# Patient Record
Sex: Male | Born: 1935 | Race: White | Hispanic: No | Marital: Married | Smoking: Former smoker
Health system: Southern US, Community
[De-identification: ages and names within clinical notes are randomized; demographics above are authoritative.]

## PROBLEM LIST (undated history)

## (undated) DIAGNOSIS — C4490 Unspecified malignant neoplasm of skin, unspecified: Secondary | ICD-10-CM

## (undated) DIAGNOSIS — T753XXA Motion sickness, initial encounter: Secondary | ICD-10-CM

## (undated) DIAGNOSIS — K219 Gastro-esophageal reflux disease without esophagitis: Secondary | ICD-10-CM

## (undated) DIAGNOSIS — E039 Hypothyroidism, unspecified: Secondary | ICD-10-CM

## (undated) DIAGNOSIS — I6529 Occlusion and stenosis of unspecified carotid artery: Secondary | ICD-10-CM

## (undated) DIAGNOSIS — I2510 Atherosclerotic heart disease of native coronary artery without angina pectoris: Secondary | ICD-10-CM

## (undated) DIAGNOSIS — G629 Polyneuropathy, unspecified: Secondary | ICD-10-CM

## (undated) DIAGNOSIS — Z951 Presence of aortocoronary bypass graft: Secondary | ICD-10-CM

## (undated) DIAGNOSIS — Z87442 Personal history of urinary calculi: Secondary | ICD-10-CM

## (undated) DIAGNOSIS — E119 Type 2 diabetes mellitus without complications: Secondary | ICD-10-CM

## (undated) DIAGNOSIS — I639 Cerebral infarction, unspecified: Secondary | ICD-10-CM

## (undated) DIAGNOSIS — Z974 Presence of external hearing-aid: Secondary | ICD-10-CM

## (undated) DIAGNOSIS — E7800 Pure hypercholesterolemia, unspecified: Secondary | ICD-10-CM

## (undated) HISTORY — PX: TONSILLECTOMY: SUR1361

## (undated) HISTORY — DX: Polyneuropathy, unspecified: G62.9

## (undated) HISTORY — PX: LITHOTRIPSY: SUR834

## (undated) HISTORY — PX: EAR BIOPSY: SHX1480

---

## 2004-11-25 ENCOUNTER — Other Ambulatory Visit: Payer: Self-pay

## 2004-11-25 ENCOUNTER — Inpatient Hospital Stay: Payer: Self-pay | Admitting: Internal Medicine

## 2004-11-28 ENCOUNTER — Inpatient Hospital Stay (HOSPITAL_COMMUNITY): Admission: AD | Admit: 2004-11-28 | Discharge: 2004-12-02 | Payer: Self-pay | Admitting: Cardiothoracic Surgery

## 2004-11-29 HISTORY — PX: CORONARY ARTERY BYPASS GRAFT: SHX141

## 2005-01-09 ENCOUNTER — Encounter: Payer: Self-pay | Admitting: Internal Medicine

## 2005-01-16 ENCOUNTER — Encounter: Payer: Self-pay | Admitting: Internal Medicine

## 2005-02-16 ENCOUNTER — Encounter: Payer: Self-pay | Admitting: Internal Medicine

## 2005-03-18 ENCOUNTER — Encounter: Payer: Self-pay | Admitting: Internal Medicine

## 2006-01-05 ENCOUNTER — Emergency Department: Payer: Self-pay | Admitting: Emergency Medicine

## 2006-03-05 ENCOUNTER — Ambulatory Visit: Payer: Self-pay | Admitting: Internal Medicine

## 2006-05-29 ENCOUNTER — Emergency Department: Payer: Self-pay | Admitting: Emergency Medicine

## 2006-05-30 ENCOUNTER — Other Ambulatory Visit: Payer: Self-pay

## 2010-06-18 DIAGNOSIS — I639 Cerebral infarction, unspecified: Secondary | ICD-10-CM

## 2010-06-18 HISTORY — DX: Cerebral infarction, unspecified: I63.9

## 2015-05-07 ENCOUNTER — Emergency Department
Admission: EM | Admit: 2015-05-07 | Discharge: 2015-05-07 | Disposition: A | Discharge status: Home / Self Care | Payer: Medicare Other | Attending: Emergency Medicine | Admitting: Emergency Medicine

## 2015-05-07 ENCOUNTER — Emergency Department: Payer: Medicare Other

## 2015-05-07 ENCOUNTER — Encounter: Payer: Self-pay | Admitting: Emergency Medicine

## 2015-05-07 DIAGNOSIS — S60411A Abrasion of left index finger, initial encounter: Secondary | ICD-10-CM | POA: Diagnosis not present

## 2015-05-07 DIAGNOSIS — W01198A Fall on same level from slipping, tripping and stumbling with subsequent striking against other object, initial encounter: Secondary | ICD-10-CM | POA: Insufficient documentation

## 2015-05-07 DIAGNOSIS — S60413A Abrasion of left middle finger, initial encounter: Secondary | ICD-10-CM | POA: Diagnosis not present

## 2015-05-07 DIAGNOSIS — S0990XA Unspecified injury of head, initial encounter: Secondary | ICD-10-CM | POA: Diagnosis present

## 2015-05-07 DIAGNOSIS — Y9289 Other specified places as the place of occurrence of the external cause: Secondary | ICD-10-CM | POA: Diagnosis not present

## 2015-05-07 DIAGNOSIS — S01111A Laceration without foreign body of right eyelid and periocular area, initial encounter: Secondary | ICD-10-CM | POA: Diagnosis not present

## 2015-05-07 DIAGNOSIS — Y998 Other external cause status: Secondary | ICD-10-CM | POA: Insufficient documentation

## 2015-05-07 DIAGNOSIS — Y9302 Activity, running: Secondary | ICD-10-CM | POA: Diagnosis not present

## 2015-05-07 DIAGNOSIS — S0083XA Contusion of other part of head, initial encounter: Secondary | ICD-10-CM

## 2015-05-07 MED ORDER — BACITRACIN-NEOMYCIN-POLYMYXIN 400-5-5000 EX OINT
TOPICAL_OINTMENT | Freq: Once | CUTANEOUS | Status: AC
  Administered 2015-05-07: 1 via TOPICAL
  Filled 2015-05-07: qty 1

## 2015-05-07 NOTE — ED Notes (Signed)
NAD noted at this time. Pt refused wheelchair to the lobby. Pt denies comments/concerns at this time.

## 2015-05-07 NOTE — ED Notes (Addendum)
Pt to ed with c/o fall today.  Pt states he ran into a boy at parade today and fell and hit head.  Denies loss of consciousness, pt denies use of blood thinner. Pt with laceration to right side of forehead.  Bleeding controlled at this time. Pt alert and oriented at this time.

## 2015-05-07 NOTE — Discharge Instructions (Signed)
Facial Laceration A facial laceration is a cut on the face. These injuries can be painful and cause bleeding. Some cuts may need to be closed with stitches (sutures), skin adhesive strips, or wound glue. Cuts usually heal quickly but can leave a scar. It can take 1-2 years for the scar to go away completely. HOME CARE   Only take medicines as told by your doctor.  Follow your doctor's instructions for wound care. For Stitches:  Keep the cut clean and dry.  If you have a bandage (dressing), change it at least once a day. Change the bandage if it gets wet or dirty, or as told by your doctor.  Wash the cut with soap and water 2 times a day. Rinse the cut with water. Pat it dry with a clean towel.  Put a thin layer of medicated cream on the cut as told by your doctor.  You may shower after the first 24 hours. Do not soak the cut in water until the stitches are removed.  Have your stitches removed as told by your doctor.  Do not wear any makeup until a few days after your stitches are removed. For Skin Adhesive Strips:  Keep the cut clean and dry.  Do not get the strips wet. You may take a bath, but be careful to keep the cut dry.  If the cut gets wet, pat it dry with a clean towel.  The strips will fall off on their own. Do not remove the strips that are still stuck to the cut. For Wound Glue:  You may shower or take baths. Do not soak or scrub the cut. Do not swim. Avoid heavy sweating until the glue falls off on its own. After a shower or bath, pat the cut dry with a clean towel.  Do not put medicine or makeup on your cut until the glue falls off.  If you have a bandage, do not put tape over the glue.  Avoid lots of sunlight or tanning lamps until the glue falls off.  The glue will fall off on its own in 5-10 days. Do not pick at the glue. After Healing:  Put sunscreen on the cut for the first year to reduce your scar. GET HELP IF:  You have a fever. GET HELP RIGHT AWAY  IF:   Your cut area gets red, painful, or puffy (swollen).  You see a yellowish-white fluid (pus) coming from the cut.   This information is not intended to replace advice given to you by your health care provider. Make sure you discuss any questions you have with your health care provider.   Document Released: 11/21/2007 Document Revised: 06/25/2014 Document Reviewed: 01/15/2013 Elsevier Interactive Patient Education [employer].

## 2015-05-07 NOTE — ED Provider Notes (Signed)
[facility] Emergency Department Provider Note  ____________________________________________  Time seen: Approximately 1:32 PM  I have reviewed the triage vital signs and the nursing notes.   HISTORY  Chief Complaint Head Injury    HPI Gary Craig is a 81 y.o. male patient with a contusion laceration to the right forehead. Patient state using 3) to avoid causing the trip and fall hitting his head. Patient denies any loss of consciousness. Patient denies any use of blood tenderness. Patient state bleeding controlled direct pressure. Patient alert and orientated. Patient is rating his pain as a 0.   History reviewed. No pertinent past medical history.  There are no active problems to display for this patient.   History reviewed. No pertinent past surgical history.  No current outpatient prescriptions on file.  Allergies Review of patient's allergies indicates no known allergies.  History reviewed. No pertinent family history.  Social History Social History  Substance Use Topics  . Smoking status: Never Smoker   . Smokeless tobacco: None  . Alcohol Use: No    Review of Systems Constitutional: No fever/chills Eyes: No visual changes. ENT: No sore throat. Cardiovascular: Denies chest pain. Respiratory: Denies shortness of breath. Gastrointestinal: No abdominal pain.  No nausea, no vomiting.  No diarrhea.  No constipation. Genitourinary: Negative for dysuria. Musculoskeletal: Negative for back pain. Skin: Negative for rash. Laceration to the right eyebrow eyebrow.  Neurological: Negative for headaches, focal weakness or numbness. 10-point ROS otherwise negative.  ____________________________________________   PHYSICAL EXAM:  VITAL SIGNS: ED Triage Vitals  Enc Vitals Group     BP --      Pulse --      Resp --      Temp --      Temp src --      SpO2 --      Weight --      Height --      Head Cir --      Peak Flow --      Pain  Score 05/07/15 1047 0     Pain Loc --      Pain Edu? --      Excl. in [place]? --    Constitutional: Alert and oriented. Well appearing and in no acute distress. Eyes: Conjunctivae are normal. PERRL. EOMI. Head: Atraumatic. Nose: No congestion/rhinnorhea. Mouth/Throat: Mucous membranes are moist.  Oropharynx non-erythematous. Neck: No stridor.  No cervical spine tenderness to palpation. Hematological/Lymphatic/Immunilogical: No cervical lymphadenopathy. Cardiovascular: Normal rate, regular rhythm. Grossly normal heart sounds.  Good peripheral circulation. Respiratory: Normal respiratory effort.  No retractions. Lungs CTAB. Gastrointestinal: Soft and nontender. No distention. No abdominal bruits. No CVA tenderness. Musculoskeletal: No lower extremity tenderness nor edema.  No joint effusions. Neurologic:  Normal speech and language. No gross focal neurologic deficits are appreciated. No gait instability. Skin:  Skin is warm, dry and intact. No rash noted. 1 cm laceration right eyebrow. There is also some abrasions to the second third digit left hand. Psychiatric: Mood and affect are normal. Speech and behavior are normal.  ____________________________________________   LABS (all labs ordered are listed, but only abnormal results are displayed)  Labs Reviewed - No data to display ____________________________________________  EKG   ____________________________________________  RADIOLOGY  CT scan of the head was unremarkable. ____________________________________________   PROCEDURES  Procedure(s) performed:   Critical Care performed: No  ____________________________________________   INITIAL IMPRESSION / ASSESSMENT AND PLAN / ED COURSE  Pertinent labs & imaging results that were available during  my care of the patient were reviewed by me and considered in my medical decision making (see chart for details). Facial contusion and laceration to the right eyebrow. Respirations  cleaning close with Dermabond. She given home care instructions. ____________________________________________   FINAL CLINICAL IMPRESSION(S) / ED DIAGNOSES  Final diagnoses:  Laceration of right eyebrow, initial encounter  Contusion of forehead, initial encounter      Sable Feil, PA-C 05/07/15 1339  Delman Kitten, MD 05/07/15 [phone]

## 2015-05-07 NOTE — ED Notes (Signed)
CT results reviewed, pt to flex wait.

## 2015-05-31 ENCOUNTER — Inpatient Hospital Stay: Payer: Medicare Other

## 2015-05-31 ENCOUNTER — Inpatient Hospital Stay
Admission: EM | Admit: 2015-05-31 | Discharge: 2015-06-01 | DRG: 065 | Disposition: A | Discharge status: Home / Self Care | Payer: Medicare Other | Attending: Internal Medicine | Admitting: Internal Medicine

## 2015-05-31 ENCOUNTER — Inpatient Hospital Stay
Admit: 2015-05-31 | Discharge: 2015-05-31 | Disposition: A | Discharge status: Home / Self Care | Payer: Medicare Other | Attending: Internal Medicine | Admitting: Internal Medicine

## 2015-05-31 ENCOUNTER — Inpatient Hospital Stay: Admit: 2015-05-31 | Payer: Medicare Other

## 2015-05-31 ENCOUNTER — Encounter: Payer: Self-pay | Admitting: Emergency Medicine

## 2015-05-31 ENCOUNTER — Emergency Department: Payer: Medicare Other

## 2015-05-31 DIAGNOSIS — M4802 Spinal stenosis, cervical region: Secondary | ICD-10-CM | POA: Diagnosis present

## 2015-05-31 DIAGNOSIS — M5410 Radiculopathy, site unspecified: Secondary | ICD-10-CM | POA: Diagnosis present

## 2015-05-31 DIAGNOSIS — E785 Hyperlipidemia, unspecified: Secondary | ICD-10-CM | POA: Diagnosis present

## 2015-05-31 DIAGNOSIS — I639 Cerebral infarction, unspecified: Secondary | ICD-10-CM | POA: Diagnosis present

## 2015-05-31 DIAGNOSIS — E7800 Pure hypercholesterolemia, unspecified: Secondary | ICD-10-CM | POA: Diagnosis present

## 2015-05-31 DIAGNOSIS — Z87891 Personal history of nicotine dependence: Secondary | ICD-10-CM | POA: Diagnosis not present

## 2015-05-31 DIAGNOSIS — Z7982 Long term (current) use of aspirin: Secondary | ICD-10-CM | POA: Diagnosis not present

## 2015-05-31 DIAGNOSIS — Z8249 Family history of ischemic heart disease and other diseases of the circulatory system: Secondary | ICD-10-CM | POA: Diagnosis not present

## 2015-05-31 DIAGNOSIS — G9589 Other specified diseases of spinal cord: Secondary | ICD-10-CM | POA: Diagnosis present

## 2015-05-31 DIAGNOSIS — I10 Essential (primary) hypertension: Secondary | ICD-10-CM | POA: Diagnosis present

## 2015-05-31 DIAGNOSIS — Z951 Presence of aortocoronary bypass graft: Secondary | ICD-10-CM

## 2015-05-31 DIAGNOSIS — Z85828 Personal history of other malignant neoplasm of skin: Secondary | ICD-10-CM | POA: Diagnosis not present

## 2015-05-31 DIAGNOSIS — I2510 Atherosclerotic heart disease of native coronary artery without angina pectoris: Secondary | ICD-10-CM | POA: Diagnosis present

## 2015-05-31 DIAGNOSIS — Z7984 Long term (current) use of oral hypoglycemic drugs: Secondary | ICD-10-CM | POA: Diagnosis not present

## 2015-05-31 DIAGNOSIS — E119 Type 2 diabetes mellitus without complications: Secondary | ICD-10-CM | POA: Diagnosis present

## 2015-05-31 DIAGNOSIS — G8191 Hemiplegia, unspecified affecting right dominant side: Secondary | ICD-10-CM | POA: Diagnosis present

## 2015-05-31 DIAGNOSIS — E039 Hypothyroidism, unspecified: Secondary | ICD-10-CM | POA: Diagnosis present

## 2015-05-31 HISTORY — DX: Occlusion and stenosis of unspecified carotid artery: I65.29

## 2015-05-31 HISTORY — DX: Pure hypercholesterolemia, unspecified: E78.00

## 2015-05-31 HISTORY — DX: Atherosclerotic heart disease of native coronary artery without angina pectoris: I25.10

## 2015-05-31 HISTORY — DX: Unspecified malignant neoplasm of skin, unspecified: C44.90

## 2015-05-31 HISTORY — DX: Presence of aortocoronary bypass graft: Z95.1

## 2015-05-31 HISTORY — DX: Type 2 diabetes mellitus without complications: E11.9

## 2015-05-31 HISTORY — DX: Hypothyroidism, unspecified: E03.9

## 2015-05-31 LAB — COMPREHENSIVE METABOLIC PANEL
ALBUMIN: 4.2 g/dL (ref 3.5–5.0)
ALT: 17 U/L (ref 17–63)
AST: 21 U/L (ref 15–41)
Alkaline Phosphatase: 117 U/L (ref 38–126)
Anion gap: 8 (ref 5–15)
BUN: 16 mg/dL (ref 6–20)
CHLORIDE: 106 mmol/L (ref 101–111)
CO2: 23 mmol/L (ref 22–32)
CREATININE: 0.81 mg/dL (ref 0.61–1.24)
Calcium: 9.3 mg/dL (ref 8.9–10.3)
GFR calc non Af Amer: >60 mL/min (ref >60)
GLUCOSE: 147 mg/dL — AB (ref 65–99)
Potassium: 4.5 mmol/L (ref 3.5–5.1)
SODIUM: 137 mmol/L (ref 135–145)
Total Bilirubin: 0.6 mg/dL (ref 0.3–1.2)
Total Protein: 7.1 g/dL (ref 6.5–8.1)

## 2015-05-31 LAB — CBC
HCT: 39.7 % — ABNORMAL LOW (ref 40.0–52.0)
Hemoglobin: 13.3 g/dL (ref 13.0–18.0)
MCH: 30.3 pg (ref 26.0–34.0)
MCHC: 33.6 g/dL (ref 32.0–36.0)
MCV: 90.3 fL (ref 80.0–100.0)
PLATELETS: 216 10*3/uL (ref 150–440)
RBC: 4.4 MIL/uL (ref 4.40–5.90)
RDW: 12.8 % (ref 11.5–14.5)
WBC: 8.8 10*3/uL (ref 3.8–10.6)

## 2015-05-31 LAB — GLUCOSE, CAPILLARY
GLUCOSE-CAPILLARY: 140 mg/dL — AB (ref 65–99)
Glucose-Capillary: 112 mg/dL — ABNORMAL HIGH (ref 65–99)
Glucose-Capillary: 165 mg/dL — ABNORMAL HIGH (ref 65–99)

## 2015-05-31 LAB — TROPONIN I: Troponin I: <0.03 ng/mL (ref <0.031)

## 2015-05-31 MED ORDER — SODIUM CHLORIDE 0.9 % IJ SOLN
3.0000 mL | Freq: Two times a day (BID) | INTRAMUSCULAR | Status: DC
  Administered 2015-05-31: 3 mL via INTRAVENOUS

## 2015-05-31 MED ORDER — ASPIRIN EC 81 MG PO TBEC: 81.0000 mg | DELAYED_RELEASE_TABLET | Freq: Every day | ORAL | Status: DC

## 2015-05-31 MED ORDER — ATORVASTATIN CALCIUM 20 MG PO TABS: 40.0000 mg | ORAL_TABLET | Freq: Every day | ORAL | Status: DC

## 2015-05-31 MED ORDER — METFORMIN HCL 500 MG PO TABS
500.0000 mg | ORAL_TABLET | Freq: Every day | ORAL | Status: DC
  Administered 2015-06-01: 500 mg via ORAL
  Filled 2015-05-31 (×2): qty 1

## 2015-05-31 MED ORDER — ASPIRIN 81 MG PO CHEW
324.0000 mg | CHEWABLE_TABLET | Freq: Once | ORAL | Status: AC
  Administered 2015-05-31: 324 mg via ORAL
  Filled 2015-05-31: qty 4

## 2015-05-31 MED ORDER — INSULIN ASPART 100 UNIT/ML ~~LOC~~ SOLN
0.0000 [IU] | Freq: Three times a day (TID) | SUBCUTANEOUS | Status: DC
Start: 2015-05-31 — End: 2015-06-01
  Administered 2015-05-31 – 2015-06-01 (×2): 1 [IU] via SUBCUTANEOUS
  Filled 2015-05-31 (×2): qty 1

## 2015-05-31 MED ORDER — PANTOPRAZOLE SODIUM 40 MG PO TBEC: 40.0000 mg | DELAYED_RELEASE_TABLET | Freq: Every day | ORAL | Status: DC

## 2015-05-31 MED ORDER — ACETAMINOPHEN 325 MG PO TABS: 650.0000 mg | ORAL_TABLET | Freq: Four times a day (QID) | ORAL | Status: DC | PRN

## 2015-05-31 MED ORDER — POLYETHYLENE GLYCOL 3350 17 G PO PACK: 17.0000 g | PACK | Freq: Every day | ORAL | Status: DC | PRN

## 2015-05-31 MED ORDER — SODIUM CHLORIDE 0.9 % IJ SOLN: 3.0000 mL | INTRAMUSCULAR | Status: DC | PRN

## 2015-05-31 MED ORDER — ONDANSETRON HCL 4 MG/2ML IJ SOLN: 4.0000 mg | Freq: Four times a day (QID) | INTRAMUSCULAR | Status: DC | PRN

## 2015-05-31 MED ORDER — ACETAMINOPHEN 650 MG RE SUPP: 650.0000 mg | Freq: Four times a day (QID) | RECTAL | Status: DC | PRN

## 2015-05-31 MED ORDER — SODIUM CHLORIDE 0.9 % IV SOLN: 250.0000 mL | INTRAVENOUS | Status: DC | PRN

## 2015-05-31 MED ORDER — ONDANSETRON HCL 4 MG PO TABS: 4.0000 mg | ORAL_TABLET | Freq: Four times a day (QID) | ORAL | Status: DC | PRN

## 2015-05-31 MED ORDER — ALBUTEROL SULFATE (2.5 MG/3ML) 0.083% IN NEBU: 2.5000 mg | INHALATION_SOLUTION | RESPIRATORY_TRACT | Status: DC | PRN

## 2015-05-31 MED ORDER — LEVOTHYROXINE SODIUM 112 MCG PO TABS
112.0000 ug | ORAL_TABLET | Freq: Every day | ORAL | Status: DC
  Administered 2015-06-01: 06:00:00 112 ug via ORAL
  Filled 2015-05-31 (×2): qty 1

## 2015-05-31 MED ORDER — ENOXAPARIN SODIUM 40 MG/0.4ML ~~LOC~~ SOLN
40.0000 mg | SUBCUTANEOUS | Status: DC
  Administered 2015-05-31: 40 mg via SUBCUTANEOUS
  Filled 2015-05-31 (×2): qty 0.4

## 2015-05-31 MED ORDER — HYDROCOD POLST-CPM POLST ER 10-8 MG/5ML PO SUER: 5.0000 mL | Freq: Two times a day (BID) | ORAL | Status: DC | PRN

## 2015-05-31 MED ORDER — SODIUM CHLORIDE 0.9 % IJ SOLN
3.0000 mL | Freq: Two times a day (BID) | INTRAMUSCULAR | Status: DC
  Administered 2015-05-31 – 2015-06-01 (×3): 3 mL via INTRAVENOUS

## 2015-05-31 NOTE — ED Notes (Signed)
Patient transported to Ultrasound 

## 2015-05-31 NOTE — Evaluation (Signed)
Occupational Therapy Evaluation Patient Details Name: Gary Craig MRN: [id number] DOB: February 12, 1934 Today's Date: 05/31/2015    History of Present Illness This patient is a 81 year old male who came to [facility] with right sided weakness.   Clinical Impression   This patient is a 81 year old male who came to [facility] stroke symptoms. He  lives with his wife in a 2 story home but does not need to go upstairs.with. He had been independent with ADL and functional mobility and plays golf.  He now shows very minimal strength deficits and minimal fine motor. Recommended if these persist for more than a week he may want to ask primary care physician for an Occupational Therapy order. No further Occupational Therapy in hospital.      Follow Up Recommendations       Equipment Recommendations       Recommendations for Other Services       Precautions / Restrictions        Mobility Bed Mobility                  Transfers                      Balance                                            ADL                                         General ADL Comments: Had been independent with ADL, plays golf.  Transport came before ADL testing but judging from this evaluation and Physical Therapy evaluation he is most likely independent with some subtle problems with (example) brushing his teeth.     Vision     Perception     Praxis      Pertinent Vitals/Pain       Hand Dominance Right   Extremity/Trunk Assessment Upper Extremity Assessment Upper Extremity Assessment:  (L LUE 5/5 strength grip 78 lbs, sensation normal for light touch, temp, and sharp R UE 4+/5 through out, grip 80 lbs sensation mildly diminished for temp, otherwise normal.  ) 9 hole peg test on R 33.59 seconds L 28.95 seconds.   Lower Extremity Assessment Lower Extremity Assessment: Defer to PT evaluation       Communication      Cognition Arousal/Alertness: Awake/alert Behavior During Therapy: WFL for tasks assessed/performed Overall Cognitive Status: Within Functional Limits for tasks assessed                     General Comments       Exercises       Shoulder Instructions      Home Living Family/patient expects to be discharged to:: Private residence Living Arrangements: Spouse/significant other Available Help at Discharge: Family Type of Home: House       Home Layout: Two level (lives on the first floor)                          Prior Functioning/Environment               OT Diagnosis: Paresis   OT Problem List:  OT Treatment/Interventions:      OT Goals(Current goals can be found in the care plan section) Acute Rehab OT Goals Patient Stated Goal: to play golf OT Goal Formulation: With patient/family  OT Frequency:     Barriers to D/C:            Co-evaluation              End of Session Equipment Utilized During Treatment:  (stroke tes kit) Completed card flipping with fingers only for neuromuscular retraining.  Activity Tolerance:   Patient left:  (transport took him to [facility])   Time: 1550-1616 OT Time Calculation (min): 26 min Charges:  OT General Charges $OT Visit: 1 Procedure OT Evaluation $Initial OT Evaluation Tier I: 1 Procedure OT Treatments $Neuromuscular Re-education: 8-22 mins G-Codes:    Myrene Galas, MS/OTR/L  05/31/2015, 4:35 PM

## 2015-05-31 NOTE — ED Notes (Signed)
Pt states that last night around 8pm, his right arm began feeling numb. Numbness continues this am in his right arm/hand, denies any pain or numbness anywhere else. Speech clear, face symmetrical per wife. Pt did play golf last night and states this "dead arm" feeling could be from that.

## 2015-05-31 NOTE — ED Notes (Addendum)
R arm weakness that pt noticed approx 8PM last night. Pt state hx of this similar weakness X "years ago". 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 Pt color WNL of extremities. Pt able to move right arm but with drift when eyes closed. Grip equal. Sensation equal. MD at bedside. Pt alert and oriented X4, active, cooperative, pt in NAD. RR even and unlabored, color WNL.

## 2015-05-31 NOTE — Evaluation (Signed)
Physical Therapy Evaluation Patient Details Name: Gary Craig MRN: [id number] DOB: [birth year] Today's Date: 05/31/2015   History of Present Illness  Pt admitted for possible CVA with complaints of R side weakness. Pt reports while playing golf, he started feeling weakness and numbness that increased later that night while playing cards. Pt with history of CAD, HTN, and DM. Pt previously independent.  Clinical Impression  Pt very pleasant and motivated to work with physical therapy. Pt demonstrates all bed mobility/transfers/ambulation at baseline level. Pt very active at baseline walking 4-5 miles daily. No sensation deficits noted. Slight coordination noted in R UE with finger->nose testing. Pt would benefit from OT consult as pt complains of difficulty with brushing teeth. B LE WNL with symmetrical strength. Pt does not require any further PT needs at this time. Pt will be dc in house and does not require follow up. RN aware. Will dc current orders.     Follow Up Recommendations No PT follow up    Equipment Recommendations  None recommended by PT    Recommendations for Other Services OT consult     Precautions / Restrictions Precautions Precautions: Fall Restrictions Weight Bearing Restrictions: No      Mobility  Bed Mobility Overal bed mobility: Independent             General bed mobility comments: safe technique performed.  Transfers Overall transfer level: Independent Equipment used: None Transfers: Sit to/from Stand           General transfer comment: safe technique performed with no AD required. No LOB noted once standing at bedside  Ambulation/Gait Ambulation/Gait assistance: Supervision Ambulation Distance (Feet): 200 Feet Assistive device: None Gait Pattern/deviations: WFL(Within Functional Limits)   Gait velocity interpretation: at or above normal speed for age/gender General Gait Details: ambulated using no AD. Safe technique with pt able to  carry conversation during ambulation. No LOB noted with mobility.  Stairs            Wheelchair Mobility    Modified Rankin (Stroke Patients Only)       Balance Overall balance assessment: Independent                                           Pertinent Vitals/Pain Pain Assessment: No/denies pain    Home Living Family/patient expects to be discharged to:: Private residence Living Arrangements: Spouse/significant other Available Help at Discharge: Family Type of Home: House Home Access: Stairs to enter Entrance Stairs-Rails: Can reach both Entrance Stairs-Number of Steps: 4 Home Layout: Able to live on main level with bedroom/bathroom Home Equipment: None      Prior Function Level of Independence: Independent               Hand Dominance   Dominant Hand: Right    Extremity/Trunk Assessment   Upper Extremity Assessment:  (R UE grossly 4+/5; L UE grossly 5/5)           Lower Extremity Assessment: Overall WFL for tasks assessed         Communication   Communication: No difficulties  Cognition Arousal/Alertness: Awake/alert Behavior During Therapy: WFL for tasks assessed/performed Overall Cognitive Status: Within Functional Limits for tasks assessed                      General Comments      Exercises  Assessment/Plan    PT Assessment Patent does not need any further PT services  PT Diagnosis Difficulty walking   PT Problem List    PT Treatment Interventions     PT Goals (Current goals can be found in the Care Plan section) Acute Rehab PT Goals Patient Stated Goal: to play golf PT Goal Formulation: With patient Time For Goal Achievement: June 11, 2015 Potential to Achieve Goals: Good    Frequency     Barriers to discharge        Co-evaluation               End of Session Equipment Utilized During Treatment: Gait belt Activity Tolerance: Patient tolerated treatment well Patient left: in  bed;with bed alarm set Nurse Communication: Mobility status         Time: [id number] PT Time Calculation (min) (ACUTE ONLY): 22 min   Charges:   PT Evaluation $Initial PT Evaluation Tier I: 1 Procedure     PT G Codes:        Thamar Holik 2015/06/11, 5:00 PM  Greggory Stallion, PT, DPT [phone]

## 2015-05-31 NOTE — ED Provider Notes (Signed)
[facility] Emergency Department Provider Note  Time seen: 8:22 AM  I have reviewed the triage vital signs and the nursing notes.   HISTORY  Chief Complaint Numbness    HPI Gary Craig is a 81 y.o. male with a past medical history of hyperlipidemia, type 2 diabetes, hypertension, CAD status post CABG 10 years ago, who presents the emergency department with right-sided deficits. According to the patient last night at 8 PM he began noticing that his right arm felt somewhat numb. He noticed he was having trouble brushing his teeth with his right hand last night. He went to sleep and upon awakening he states the numbness is worse. He was unable to brush his teeth this morning with his right arm. They came to the emergency department, and upon getting out of his car he feels that his right leg was weak as well. Denies any confusion, speech deficits, or headache. Patient takes an 81 mg aspirin daily as his only blood thinner.No history of CVA in the past. Describes his symptoms as moderate.     Past Medical History  Diagnosis Date  . High cholesterol   . Diabetes mellitus without complication ([place])     There are no active problems to display for this patient.   Past Surgical History  Procedure Laterality Date  . Coronary artery bypass graft      Current Outpatient Rx  Name  Route  Sig  Dispense  Refill  . aspirin EC 81 MG tablet   Oral   Take 1 tablet by mouth daily.         Marland Kitchen atorvastatin (LIPITOR) 40 MG tablet   Oral   Take 1 tablet by mouth daily.         . chlorpheniramine-HYDROcodone (TUSSIONEX) 10-8 MG/5ML SUER   Oral   Take 5 mLs by mouth every 12 (twelve) hours as needed.          . Glucosamine-Chondroitin 500-400 MG CAPS   Oral   Take 1 tablet by mouth daily.         Marland Kitchen levothyroxine (SYNTHROID, LEVOTHROID) 112 MCG tablet   Oral   Take 1 tablet by mouth daily.         Marland Kitchen lisinopril (PRINIVIL,ZESTRIL) 2.5 MG tablet   Oral   Take 1 tablet by mouth daily.         . metFORMIN (GLUCOPHAGE) 500 MG tablet   Oral   Take 1 tablet by mouth daily.         Marland Kitchen NEXIUM 40 MG capsule   Oral   Take 1 capsule by mouth daily.           Dispense as written.     Allergies Review of patient's allergies indicates no known allergies.  No family history on file.  Social History Social History  Substance Use Topics  . Smoking status: Former Research scientist (life sciences)  . Smokeless tobacco: None  . Alcohol Use: No    Review of Systems Constitutional: Negative for fever. Eyes: Negative for visual changes. Cardiovascular: Negative for chest pain. Respiratory: Negative for shortness of breath. Gastrointestinal: Negative for abdominal pain Neurological: Negative for headache. Positive for right arm numbness/weakness, positive for right leg weakness. 10-point ROS otherwise negative.  ____________________________________________   PHYSICAL EXAM:  VITAL SIGNS: ED Triage Vitals  Enc Vitals Group     BP 05/31/15 0808 121/68 mmHg     Pulse Rate 05/31/15 0808 83     Resp 05/31/15 0808 18  Temp 05/31/15 0808 97.5 F (36.4 C)     Temp Source 05/31/15 0808 Oral     SpO2 05/31/15 0808 99 %     Weight 05/31/15 0808 163 lb (73.936 kg)     Height 05/31/15 0808 5\' 8"  (1.727 m)     Head Cir --      Peak Flow --      Pain Score --      Pain Loc --      Pain Edu? --      Excl. in [place]? --     Constitutional: Alert and oriented. Well appearing and in no distress. Eyes: Normal exam ENT   Head: Normocephalic and atraumatic.   Mouth/Throat: Mucous membranes are moist. Cardiovascular: Normal rate, regular rhythm. No murmur Respiratory: Normal respiratory effort without tachypnea nor retractions. Breath sounds are clear and equal bilaterally. No wheezes/rales/rhonchi. Gastrointestinal: Soft and nontender. No distention.   Musculoskeletal: Nontender, atraumatic extremities. No lower extremity edema. Neurologic:  Normal speech  and language. Patient does have a pronator drift in the right upper extremity, as well as a right lower extremity drift. 4/5 motor in right upper and lower extremities, 5/5 motor in left extremities. Sensation is intact and equal in bilateral upper and lower extremities per patient. No facial deficit, cranial nerves intact Skin:  Skin is warm, dry and intact.  Psychiatric: Mood and affect are normal. Speech and behavior are normal.   ____________________________________________    EKG  EKG reviewed and interpreted by myself shows normal sinus rhythm at 75 bpm, narrow QRS, normal axis, normal intervals, no ST changes. Normal EKG.  ____________________________________________    RADIOLOGY  CT shows no acute abnormality  ____________________________________________    INITIAL IMPRESSION / ASSESSMENT AND PLAN / ED COURSE  Pertinent labs & imaging results that were available during my care of the patient were reviewed by me and considered in my medical decision making (see chart for details).  Patient presents with signs and symptoms most suggestive of CVA affecting the right side, likely left MCA distribution. We will check labs, CT head to rule out intracranial hemorrhage. Patient is outside of the TPA window his symptoms started approximately 12 hours ago. We will monitor closely in the emergency department, with plan to admit to the hospital for further treatment pending CT results.  CT is negative. Labs are largely within normal limits. Patient dosed aspirin, we will admit to the hospital for CVA.    NIH Stroke Scale   Interval: Baseline Time: 8:27 AM Person Administering Scale: Leocadia Idleman  Administer stroke scale items in the order listed. Record performance in each category after each subscale exam. Do not go back and change scores. Follow directions provided for each exam technique. Scores should reflect what the patient does, not what the clinician thinks the  patient can do. The clinician should record answers while administering the exam and work quickly. Except where indicated, the patient should not be coached (i.e., repeated requests to patient to make a special effort).   1a  Level of consciousness: 0=alert; keenly responsive  1b. LOC questions:  0=Performs both tasks correctly  1c. LOC commands: 0=Performs both tasks correctly  2.  Best Gaze: 0=normal  3.  Visual: 0=No visual loss  4. Facial Palsy: 0=Normal symmetric movement  5a.  Motor left arm: 0=No drift, limb holds 90 (or 45) degrees for full 10 seconds  5b.  Motor right arm: 1=Drift, limb holds 90 (or 45) degrees but drifts down before full 10  seconds: does not hit bed  6a. motor left leg: 0=No drift, limb holds 90 (or 45) degrees for full 10 seconds  6b  Motor right leg:  1=Drift, limb holds 90 (or 45) degrees but drifts down before full 10 seconds: does not hit bed  7. Limb Ataxia: 1=Present in one limb  8.  Sensory: 0=Normal; no sensory loss  9. Best Language:  0=No aphasia, normal  10. Dysarthria: 0=Normal  11. Extinction and Inattention: 0=No abnormality  12. Distal motor function: 0=Normal   Total:   3   ____________________________________________   FINAL CLINICAL IMPRESSION(S) / ED DIAGNOSES  Cerebrovascular accident   Harvest Dark, MD 05/31/15 1013

## 2015-05-31 NOTE — Progress Notes (Signed)
*  PRELIMINARY RESULTS* Echocardiogram 2D Echocardiogram has been performed.  Gary Craig 05/31/2015, 4:58 PM

## 2015-05-31 NOTE — ED Notes (Signed)
Admitted MD at bedside. 

## 2015-05-31 NOTE — Plan of Care (Signed)
Problem: Education: Goal: Knowledge of [facility] Education information/materials will improve Outcome: Progressing Provided admission information to pt and family regarding unit policies, belongings, password.  Problem: Safety: Goal: Ability to remain free from injury will improve Outcome: Progressing Pt remains safe.  Moderate fall risk due to age and possible CVA /symptoms to RUE and RLE  Problem: Pain Managment: Goal: General experience of comfort will improve Outcome: Progressing Pt denies pain at this time.  Problem: Physical Regulation: Goal: Ability to maintain clinical measurements within normal limits will improve Outcome: Progressing Continue to monitor for s/s of CVA every 2 hours. NIH score of 1.  Problem: Education: Goal: Knowledge of disease or condition will improve Outcome: Progressing No change Goal: Knowledge of secondary prevention will improve Outcome: Progressing Stroke prevention handout provided

## 2015-05-31 NOTE — Progress Notes (Signed)
OT Cancellation Note  Patient Details Name: Gary Craig MRN: [id number] DOB: Jan 20, 1934   Cancelled Treatment:    Reason Eval/Treat Not Completed: Patient at procedure or test/ unavailable,  Will re-attempt time permitting.  Sharon Mt 05/31/2015, 1:49 PM

## 2015-05-31 NOTE — H&P (Signed)
[facility] [place] NAME: Gary Craig    MR#:  [id number]  DATE OF BIRTH:  December 03, 1933  DATE OF ADMISSION:  05/31/2015  PRIMARY CARE PHYSICIAN: No primary care provider on file.   REQUESTING/REFERRING PHYSICIAN: Dr. Kerman Passey  CHIEF COMPLAINT:   Chief Complaint  Patient presents with  . Numbness    HISTORY OF PRESENT ILLNESS:  Gary Craig  is a 81 y.o. male with a known history of CAD, HTN, DM here with right weakness. Patient played golf yesterday evening and after that noticed that he had some numbness in his right arm. Later he had difficulty brushing his teeth  today morning and presented to the emergency room. No history of stroke. Symptoms remain unchanged at this point. No dysphagia, change in vision. No chest pain or palpitations. No history of atrial fibrillation. Takes daily aspirin and statin. CT scan of the head showed nothing acute. EKG showed normal sinus rhythm.  PAST MEDICAL HISTORY:   Past Medical History  Diagnosis Date  . High cholesterol   . Diabetes mellitus without complication ([place])   . Carotid stenosis   . S/P CABG (coronary artery bypass graft)   . CAD (coronary artery disease)   . Hypothyroidism   . HTN (hypertension)     PAST SURGICAL HISTORY:   Past Surgical History  Procedure Laterality Date  . Coronary artery bypass graft      SOCIAL HISTORY:   Social History  Substance Use Topics  . Smoking status: Former Research scientist (life sciences)  . Smokeless tobacco: Not on file  . Alcohol Use: No    FAMILY HISTORY:   Family History  Problem Relation Age of Onset  . CAD Brother   . Heart block Brother     DRUG ALLERGIES:  No Known Allergies  REVIEW OF SYSTEMS:   Review of Systems  Constitutional: Negative for fever, chills, weight loss and malaise/fatigue.  HENT: Negative for hearing loss and nosebleeds.   Eyes: Negative for blurred vision, double vision and pain.  Respiratory: Negative for  cough, hemoptysis, sputum production, shortness of breath and wheezing.   Cardiovascular: Negative for chest pain, palpitations, orthopnea and leg swelling.  Gastrointestinal: Negative for nausea, vomiting, abdominal pain, diarrhea and constipation.  Genitourinary: Negative for dysuria and hematuria.  Musculoskeletal: Negative for myalgias, back pain and falls.  Skin: Negative for rash.  Neurological: Positive for focal weakness. Negative for dizziness, tremors, sensory change, speech change, seizures and headaches.  Endo/Heme/Allergies: Does not bruise/bleed easily.  Psychiatric/Behavioral: Negative for depression and memory loss. The patient is not nervous/anxious.     MEDICATIONS AT HOME:   Prior to Admission medications   Medication Sig Start Date End Date Taking? Authorizing Provider  aspirin EC 81 MG tablet Take 1 tablet by mouth daily.   Yes Historical Provider, MD  atorvastatin (LIPITOR) 40 MG tablet Take 1 tablet by mouth daily. 04/28/15  Yes Historical Provider, MD  chlorpheniramine-HYDROcodone (TUSSIONEX) 10-8 MG/5ML SUER Take 5 mLs by mouth every 12 (twelve) hours as needed.  05/16/15  Yes Historical Provider, MD  Glucosamine-Chondroitin 500-400 MG CAPS Take 1 tablet by mouth daily.   Yes Historical Provider, MD  levothyroxine (SYNTHROID, LEVOTHROID) 112 MCG tablet Take 1 tablet by mouth daily. 03/20/15  Yes Historical Provider, MD  lisinopril (PRINIVIL,ZESTRIL) 2.5 MG tablet Take 1 tablet by mouth daily. 04/20/15  Yes Historical Provider, MD  metFORMIN (GLUCOPHAGE) 500 MG tablet Take 1 tablet by mouth daily. 04/29/15  Yes Historical Provider, MD  NEXIUM 40 MG capsule Take 1 capsule by mouth daily. 05/16/15  Yes Historical Provider, MD      VITAL SIGNS:  Blood pressure 116/55, pulse 61, temperature 97.5 F (36.4 C), temperature source Oral, resp. rate 19, height 5\' 8"  (1.727 m), weight 73.936 kg (163 lb), SpO2 100 %.  PHYSICAL EXAMINATION:  Physical Exam  GENERAL:  81  y.o.-year-old patient lying in the bed with no acute distress.  EYES: Pupils equal, round, reactive to light and accommodation. No scleral icterus. Extraocular muscles intact.  HEENT: Head atraumatic, normocephalic. Oropharynx and nasopharynx clear. No oropharyngeal erythema, moist oral mucosa  NECK:  Supple, no jugular venous distention. No thyroid enlargement, no tenderness.  LUNGS: Normal breath sounds bilaterally, no wheezing, rales, rhonchi. No use of accessory muscles of respiration.  CARDIOVASCULAR: S1, S2 normal. No murmurs, rubs, or gallops.  ABDOMEN: Soft, nontender, nondistended. Bowel sounds present. No organomegaly or mass.  EXTREMITIES: No pedal edema, cyanosis, or clubbing. + 2 pedal & radial pulses b/l.   NEUROLOGIC: Cranial nerves II through XII are intact. No sensory deficits appreciated b/l. Motor right 4/5. Left motor 5/5. Hyperreflexia PSYCHIATRIC: The patient is alert and oriented x 3. Good affect.  SKIN: No obvious rash, lesion, or ulcer.   LABORATORY PANEL:   CBC  Recent Labs Lab 05/31/15 0821  WBC 8.8  HGB 13.3  HCT 39.7*  PLT 216   ------------------------------------------------------------------------------------------------------------------  Chemistries   Recent Labs Lab 05/31/15 0821  NA 137  K 4.5  CL 106  CO2 23  GLUCOSE 147*  BUN 16  CREATININE 0.81  CALCIUM 9.3  AST 21  ALT 17  ALKPHOS 117  BILITOT 0.6   ------------------------------------------------------------------------------------------------------------------  Cardiac Enzymes  Recent Labs Lab 05/31/15 0821  TROPONINI <0.03   ------------------------------------------------------------------------------------------------------------------  RADIOLOGY:  Ct Head Wo Contrast  05/31/2015  CLINICAL DATA:  One day history of right upper strict extremity weakness EXAM: CT HEAD WITHOUT CONTRAST TECHNIQUE: Contiguous axial images were obtained from the base of the skull through  the vertex without intravenous contrast. COMPARISON:  May 07, 2015 FINDINGS: There is mild diffuse atrophy, stable. There is no intracranial mass hemorrhage, extra-axial fluid collection, or midline shift. Gray-white compartments appear normal. No acute infarct evident. The bony calvarium appears intact. The mastoid air cells are clear. There is debris in the right external auditory canal. No intraorbital lesions are identified. IMPRESSION: Mild atrophy. No intracranial mass, hemorrhage, or focal gray -white compartment lesions/acute appearing infarct. Probable cerumen right external auditory canal. Electronically Signed   By: Lowella Grip III M.D.   On: 05/31/2015 09:27     IMPRESSION AND PLAN:   * Acute CVA with right sided weakness - Onset 14 hrs back ASA, Statin, Neuro checks. PT/OT. Telemetry MRI brain, Echo, Carotid dopplers. Neuro consult. Also check MRI C-Spine due to hyper reflexia.  * HTN Hold meds for permissive HTN  * DM 2 Metformin. SSI. ADA  * CAD Stable  * DVT prophylaxis Lovenox   All the records are reviewed and case discussed with ED provider. Management plans discussed with the patient, family and they are in agreement.  CODE STATUS: FULL  TOTAL TIME TAKING CARE OF THIS PATIENT: 45 minutes.    Hillary Bow R M.D on 05/31/2015 at 11:00 AM  Between 7am to 6pm - Pager - [phone]  After 6pm go to [email or web address] - password [email or web address] [facility]  [facility]  Office  [phone]  CC: Primary care physician; No primary care provider on file.  Note: This dictation was prepared with Dragon dictation along with smaller phrase technology. Any transcriptional errors that result from this process are unintentional.

## 2015-05-31 NOTE — Progress Notes (Signed)
PT Cancellation Note  Patient Details Name: Gary Craig MRN: [id number] DOB: 1933/07/15   Cancelled Treatment:    Reason Eval/Treat Not Completed: Other (comment). Pt currently out of room at this time for imaging. Will re-attempt   Rionna Feltes 05/31/2015, 2:55 PM Greggory Stallion, PT, DPT [phone]

## 2015-05-31 NOTE — ED Notes (Signed)
Pt given sandwich tray and drink per request. Admitting MD at bedside.

## 2015-05-31 NOTE — ED Notes (Signed)
MD at bedside. 

## 2015-06-01 LAB — LIPID PANEL
CHOL/HDL RATIO: 4.1 ratio
CHOLESTEROL: 119 mg/dL (ref 0–200)
HDL: 29 mg/dL — ABNORMAL LOW (ref >40)
LDL Cholesterol: 35 mg/dL (ref 0–99)
TRIGLYCERIDES: 274 mg/dL — AB (ref <150)
VLDL: 55 mg/dL — AB (ref 0–40)

## 2015-06-01 LAB — GLUCOSE, CAPILLARY: Glucose-Capillary: 130 mg/dL — ABNORMAL HIGH (ref 65–99)

## 2015-06-01 MED ORDER — CLOPIDOGREL BISULFATE 75 MG PO TABS
75.0000 mg | ORAL_TABLET | Freq: Every day | ORAL | Status: DC
  Administered 2015-06-01: 75 mg via ORAL
  Filled 2015-06-01: qty 1

## 2015-06-01 MED ORDER — CLOPIDOGREL BISULFATE 75 MG PO TABS: 75.0000 mg | ORAL_TABLET | Freq: Every day | ORAL | Status: DC

## 2015-06-01 NOTE — Plan of Care (Signed)
Problem: Education: Goal: Knowledge of [facility] Education information/materials will improve Outcome: Progressing Education provided about risk factors and s/s of stroke.  Education about possible treatments such as blood thinners.  Patient stated that he understands education and in the future will come to emergency room immediately when experiencing s/s of stroke instead of waiting.  Problem: Safety: Goal: Ability to remain free from injury will improve Outcome: Progressing Pt remains safe. Patient is a moderate fall risk due to slight weakness in lower right extremity. Patient up with steady gait to bathroom with one person assist for safety.  Bed alarm on throughout shift.  Problem: Pain Managment: Goal: General experience of comfort will improve Outcome: Progressing Patient without complaints of pain this shift.  Problem: Physical Regulation: Goal: Ability to maintain clinical measurements within normal limits will improve Outcome: Progressing Neuro checks every 2 hours until midnight performed and then every four hours. NIH score of 1 due to slight numbness sensitivity in right arm.

## 2015-06-01 NOTE — Progress Notes (Signed)
Pt discharged today, IV removed, discharge instructions reviewed with patient. Pt stated understanding. Pt rolled out in wheelchair by staff.

## 2015-06-01 NOTE — Discharge Summary (Signed)
[facility] [place] NAME: Gary Craig    MR#:  [id number]  DATE OF BIRTH:  [birth year]  DATE OF ADMISSION:  05/31/2015 ADMITTING PHYSICIAN: Hillary Bow, MD  DATE OF DISCHARGE: 06/01/2015 PRIMARY CARE PHYSICIAN: Dr. Danise Edge   ADMISSION DIAGNOSIS:  CVA (cerebral infarction) [I63.9] Radiculopathy affecting upper extremity [M54.10] Cerebral infarction due to unspecified mechanism [I63.9]  DISCHARGE DIAGNOSIS:  Active Problems:   CVA (cerebral infarction)   SECONDARY DIAGNOSIS:   Past Medical History  Diagnosis Date  . High cholesterol   . Diabetes mellitus without complication ([place])   . Carotid stenosis   . S/P CABG (coronary artery bypass graft)   . CAD (coronary artery disease)   . Hypothyroidism   . HTN (hypertension)   . Skin cancer     HOSPITAL COURSE:    81 year old very pleasant male with a history of diabetes and coronary artery disease who presented with right arm weakness and found to have an acute stroke. For further details please refer to H&P.  1. 8 mm acute infarct left parietal white matter: This was seen by MRI. Patient's symptoms have now resolved. Patient is on aspirin daily so was switched to Plavix. Side effects, alternatives, risks, and benefits were discussed with him not limited to bleeding and bruising. He accepted these risks and would like to start Plavix. He will continue on statin. He was evaluated by physical therapy and occupational therapy. His symptoms have now improved and does not need further rehabilitation at discharge. Carotid Doppler showed no significant stenosis. 2-D echocardiogram showed normal ejection fraction. 2. History of diabetes: Patient will continue his outpatient medications.  3. History of CAD: Continue atorvastatin and he is now on Plavix.  4. Spinal stenosis: At the level CIV-C5 on MRI it does show moderate spinal stenosis. He can outpatient follow-up for  this.   DISCHARGE CONDITIONS AND DIET:   Patient will be discharged on a diabetic heart healthy diet in stable condition  CONSULTS OBTAINED:  Treatment Team:  Leotis Pain, MD  DRUG ALLERGIES:  No Known Allergies  DISCHARGE MEDICATIONS:   Current Discharge Medication List    START taking these medications   Details  clopidogrel (PLAVIX) 75 MG tablet Take 1 tablet (75 mg total) by mouth daily. Qty: 30 tablet, Refills: 0      CONTINUE these medications which have NOT CHANGED   Details  atorvastatin (LIPITOR) 40 MG tablet Take 1 tablet by mouth daily.    chlorpheniramine-HYDROcodone (TUSSIONEX) 10-8 MG/5ML SUER Take 5 mLs by mouth every 12 (twelve) hours as needed.     Glucosamine-Chondroitin 500-400 MG CAPS Take 1 tablet by mouth daily.    levothyroxine (SYNTHROID, LEVOTHROID) 112 MCG tablet Take 1 tablet by mouth daily.    lisinopril (PRINIVIL,ZESTRIL) 2.5 MG tablet Take 1 tablet by mouth daily.    metFORMIN (GLUCOPHAGE) 500 MG tablet Take 1 tablet by mouth daily.    NEXIUM 40 MG capsule Take 1 capsule by mouth daily.      STOP taking these medications     aspirin EC 81 MG tablet               Today   CHIEF COMPLAINT:   Patient reports that his symptoms have now subsided. He has no weakness in the right arm no other neurological deficits.   VITAL SIGNS:  Blood pressure 134/65, pulse 59, temperature 98.2 F (36.8 C), temperature source Oral, resp. rate 18, height 5\' 8"  (1.727 m),  weight 73.936 kg (163 lb), SpO2 97 %.   REVIEW OF SYSTEMS:  Review of Systems  Constitutional: Negative for fever, chills and malaise/fatigue.  HENT: Negative for sore throat.   Eyes: Negative for blurred vision.  Respiratory: Negative for cough, hemoptysis, shortness of breath and wheezing.   Cardiovascular: Negative for chest pain, palpitations and leg swelling.  Gastrointestinal: Negative for nausea, vomiting, abdominal pain, diarrhea and blood in stool.   Genitourinary: Negative for dysuria.  Musculoskeletal: Negative for back pain.  Neurological: Negative for dizziness, tremors and headaches.  Endo/Heme/Allergies: Does not bruise/bleed easily.     PHYSICAL EXAMINATION:  GENERAL:  81 y.o.-year-old patient lying in the bed with no acute distress.  NECK:  Supple, no jugular venous distention. No thyroid enlargement, no tenderness.  LUNGS: Normal breath sounds bilaterally, no wheezing, rales,rhonchi  No use of accessory muscles of respiration.  CARDIOVASCULAR: S1, S2 normal. No murmurs, rubs, or gallops.  ABDOMEN: Soft, non-tender, non-distended. Bowel sounds present. No organomegaly or mass.  EXTREMITIES: No pedal edema, cyanosis, or clubbing.  PSYCHIATRIC: The patient is alert and oriented x 3.  SKIN: No obvious rash, lesion, or ulcer.   DATA REVIEW:   CBC  Recent Labs Lab 05/31/15 0821  WBC 8.8  HGB 13.3  HCT 39.7*  PLT 216    Chemistries   Recent Labs Lab 05/31/15 0821  NA 137  K 4.5  CL 106  CO2 23  GLUCOSE 147*  BUN 16  CREATININE 0.81  CALCIUM 9.3  AST 21  ALT 17  ALKPHOS 117  BILITOT 0.6    Cardiac Enzymes  Recent Labs Lab 05/31/15 0821  TROPONINI <0.03    Microbiology Results  @[email or web address] @  RADIOLOGY:  Ct Head Wo Contrast  05/31/2015  CLINICAL DATA:  One day history of right upper strict extremity weakness EXAM: CT HEAD WITHOUT CONTRAST TECHNIQUE: Contiguous axial images were obtained from the base of the skull through the vertex without intravenous contrast. COMPARISON:  May 07, 2015 FINDINGS: There is mild diffuse atrophy, stable. There is no intracranial mass hemorrhage, extra-axial fluid collection, or midline shift. Gray-white compartments appear normal. No acute infarct evident. The bony calvarium appears intact. The mastoid air cells are clear. There is debris in the right external auditory canal. No intraorbital lesions are identified. IMPRESSION: Mild atrophy. No intracranial mass,  hemorrhage, or focal gray -white compartment lesions/acute appearing infarct. Probable cerumen right external auditory canal. Electronically Signed   By: Lowella Grip III M.D.   On: 05/31/2015 09:27   Mr Brain Wo Contrast  05/31/2015  CLINICAL DATA:  Right arm weakness. EXAM: MRI HEAD WITHOUT CONTRAST TECHNIQUE: Multiplanar, multiecho pulse sequences of the brain and surrounding structures were obtained without intravenous contrast. COMPARISON:  CT head 05/31/2015 FINDINGS: Acute infarct left parietal white matter measuring approximately 8 mm. No other acute infarct identified. Age-appropriate atrophy. Mild ventricular enlargement consistent with atrophy. Several small white matter hyperintensities compatible with mild chronic microvascular ischemic change, less than expected for age Negative for hemorrhage Negative for mass or edema.  No shift of the midline structures Mild mucosal edema in the paranasal sinuses without air-fluid level. Normal orbit. Pituitary normal in size. IMPRESSION: 8 mm acute infarct left parietal white matter. Minimal chronic microvascular ischemic change in the white matter. Electronically Signed   By: Franchot Gallo M.D.   On: 05/31/2015 14:53   Mr Cervical Spine Wo Contrast  05/31/2015  CLINICAL DATA:  Right arm weakness. EXAM: MRI CERVICAL SPINE WITHOUT CONTRAST TECHNIQUE: Multiplanar, multisequence MR  imaging of the cervical spine was performed. No intravenous contrast was administered. COMPARISON:  None. FINDINGS: Negative for fracture or mass. Mild anterior slip C4-5. Prominent anterior osteophytes from C3 through C7 due to disc degeneration. C2-3: Mild uncinate spurring centrally. Bilateral facet degeneration. Mild spinal stenosis and mild foraminal stenosis bilaterally. C3-4: Small left paracentral disc protrusion with associated spurring. Bilateral facet hypertrophy. Mild spinal stenosis and mild foraminal stenosis bilaterally. C4-5: Moderately large central disc  protrusion. Advanced facet hypertrophy bilaterally. Moderate spinal stenosis and moderate foraminal stenosis bilaterally. Subtle signal changes in the cord best seen on inversion recovery sequence. This may be due to acute or chronic myelomalacia. C5-6: Mild disc degeneration. Moderate facet degeneration. No significant spinal or foraminal stenosis C6-7: Mild facet degeneration bilaterally without spinal or foraminal stenosis C7-T1:  Negative IMPRESSION: C4-5 moderate spinal stenosis. Central disc protrusion with advanced facet hypertrophy. Subtle cord signal abnormality consistent with acute or chronic myelomalacia. Mild spinal and foraminal stenosis bilaterally at C2-3 and C3-4. Electronically Signed   By: Franchot Gallo M.D.   On: 05/31/2015 14:58   US Carotid Bilateral  05/31/2015  CLINICAL DATA:  Stroke. Hypertension, coronary bypass grafting, diabetes, previous tobacco abuse. EXAM: BILATERAL CAROTID DUPLEX ULTRASOUND TECHNIQUE: Pearline Cables scale imaging, color Doppler and duplex ultrasound was performed of bilateral carotid and vertebral arteries in the neck. COMPARISON:  MRA 03/05/2006 by report only REVIEW OF SYSTEMS: Quantification of carotid stenosis is based on velocity parameters that correlate the residual internal carotid diameter with NASCET-based stenosis levels, using the diameter of the distal internal carotid lumen as the denominator for stenosis measurement. The following velocity measurements were obtained: PEAK SYSTOLIC/END DIASTOLIC RIGHT ICA:                     88/29cm/sec CCA:                     123456 SYSTOLIC ICA/CCA RATIO:  0.8 DIASTOLIC ICA/CCA RATIO: 1.2 ECA:                     94cm/sec LEFT ICA:                     117/29cm/sec CCA:                     123456 SYSTOLIC ICA/CCA RATIO:  1.2 DIASTOLIC ICA/CCA RATIO: 1.2 ECA:                     158cm/sec FINDINGS: RIGHT CAROTID ARTERY: Eccentric nonocclusive plaque in the distal common carotid artery. Partially calcified mild  plaque in the carotid bifurcation extending to the ICA origin without significant stenosis. Normal waveforms and color Doppler signal. RIGHT VERTEBRAL ARTERY:  Normal flow direction and waveform. LEFT CAROTID ARTERY: Focal partially calcified plaque at the ICA origin resulting in at least mild stenosis. Normal waveforms and color Doppler signal however. LEFT VERTEBRAL ARTERY: Normal flow direction and waveform. IMPRESSION: 1. Bilateral proximal ICA plaque, resulting in less than 50% diameter stenosis. The exam does not exclude plaque ulceration or embolization. Continued surveillance recommended. Electronically Signed   By: Lucrezia Europe M.D.   On: 05/31/2015 12:20      Management plans discussed with the patient and he is in agreement. Stable for discharge home  Patient should follow up with PCP in one week  CODE STATUS:     Code Status Orders        Start  Ordered   05/31/15 1057  Full code   Continuous     05/31/15 1058      TOTAL TIME TAKING CARE OF THIS PATIENT: 35 minutes.    Note: This dictation was prepared with Dragon dictation along with smaller phrase technology. Any transcriptional errors that result from this process are unintentional.  Ladrea Holladay M.D on 06/01/2015 at 9:33 AM  Between 7am to 6pm - Pager - [phone] After 6pm go to [email or web address] - password [email or web address] [facility]  [facility]  Office  [phone]  CC: Primary care physician; No primary care provider on file.

## 2015-12-16 ENCOUNTER — Emergency Department: Payer: Medicare Other

## 2015-12-16 ENCOUNTER — Emergency Department
Admission: EM | Admit: 2015-12-16 | Discharge: 2015-12-16 | Disposition: A | Discharge status: Home / Self Care | Payer: Medicare Other | Attending: Emergency Medicine | Admitting: Emergency Medicine

## 2015-12-16 ENCOUNTER — Encounter: Payer: Self-pay | Admitting: Emergency Medicine

## 2015-12-16 DIAGNOSIS — Z79899 Other long term (current) drug therapy: Secondary | ICD-10-CM | POA: Diagnosis not present

## 2015-12-16 DIAGNOSIS — Z8673 Personal history of transient ischemic attack (TIA), and cerebral infarction without residual deficits: Secondary | ICD-10-CM | POA: Insufficient documentation

## 2015-12-16 DIAGNOSIS — Z7984 Long term (current) use of oral hypoglycemic drugs: Secondary | ICD-10-CM | POA: Diagnosis not present

## 2015-12-16 DIAGNOSIS — I2510 Atherosclerotic heart disease of native coronary artery without angina pectoris: Secondary | ICD-10-CM | POA: Insufficient documentation

## 2015-12-16 DIAGNOSIS — I10 Essential (primary) hypertension: Secondary | ICD-10-CM | POA: Diagnosis not present

## 2015-12-16 DIAGNOSIS — Z85828 Personal history of other malignant neoplasm of skin: Secondary | ICD-10-CM | POA: Insufficient documentation

## 2015-12-16 DIAGNOSIS — R29898 Other symptoms and signs involving the musculoskeletal system: Secondary | ICD-10-CM

## 2015-12-16 DIAGNOSIS — R200 Anesthesia of skin: Secondary | ICD-10-CM

## 2015-12-16 DIAGNOSIS — E039 Hypothyroidism, unspecified: Secondary | ICD-10-CM | POA: Insufficient documentation

## 2015-12-16 DIAGNOSIS — Z951 Presence of aortocoronary bypass graft: Secondary | ICD-10-CM | POA: Diagnosis not present

## 2015-12-16 DIAGNOSIS — E119 Type 2 diabetes mellitus without complications: Secondary | ICD-10-CM | POA: Diagnosis not present

## 2015-12-16 DIAGNOSIS — Z87891 Personal history of nicotine dependence: Secondary | ICD-10-CM | POA: Diagnosis not present

## 2015-12-16 DIAGNOSIS — R202 Paresthesia of skin: Secondary | ICD-10-CM | POA: Diagnosis not present

## 2015-12-16 DIAGNOSIS — M4802 Spinal stenosis, cervical region: Secondary | ICD-10-CM

## 2015-12-16 LAB — BASIC METABOLIC PANEL
Anion gap: 9 (ref 5–15)
BUN: 17 mg/dL (ref 6–20)
CO2: 29 mmol/L (ref 22–32)
Calcium: 9.7 mg/dL (ref 8.9–10.3)
Chloride: 98 mmol/L — ABNORMAL LOW (ref 101–111)
Creatinine, Ser: 0.84 mg/dL (ref 0.61–1.24)
GFR calc Af Amer: >60 mL/min (ref >60)
GLUCOSE: 167 mg/dL — AB (ref 65–99)
POTASSIUM: 4.5 mmol/L (ref 3.5–5.1)
Sodium: 136 mmol/L (ref 135–145)

## 2015-12-16 LAB — URINALYSIS COMPLETE WITH MICROSCOPIC (ARMC ONLY)
BACTERIA UA: NONE SEEN
BILIRUBIN URINE: NEGATIVE
GLUCOSE, UA: NEGATIVE mg/dL
HGB URINE DIPSTICK: NEGATIVE
Ketones, ur: NEGATIVE mg/dL
Leukocytes, UA: NEGATIVE
NITRITE: NEGATIVE
Protein, ur: NEGATIVE mg/dL
Specific Gravity, Urine: 1.011 (ref 1.005–1.030)
pH: 8 (ref 5.0–8.0)

## 2015-12-16 LAB — CBC
HEMATOCRIT: 38.1 % — AB (ref 40.0–52.0)
Hemoglobin: 12.9 g/dL — ABNORMAL LOW (ref 13.0–18.0)
MCH: 30.9 pg (ref 26.0–34.0)
MCHC: 34 g/dL (ref 32.0–36.0)
MCV: 90.8 fL (ref 80.0–100.0)
Platelets: 200 10*3/uL (ref 150–440)
RBC: 4.19 MIL/uL — ABNORMAL LOW (ref 4.40–5.90)
RDW: 13.2 % (ref 11.5–14.5)
WBC: 6.4 10*3/uL (ref 3.8–10.6)

## 2015-12-16 MED ORDER — METHYLPREDNISOLONE 4 MG PO TBPK: ORAL_TABLET | ORAL | Status: DC

## 2015-12-16 NOTE — ED Provider Notes (Signed)
[facility] Emergency Department Provider Note    ____________________________________________  Time seen: ~1550  I have reviewed the triage vital signs and the nursing notes.   HISTORY  Chief Complaint Numbness and Extremity Weakness   History limited by: Not Limited   HPI Gary Craig is a 82 y.o. male who presents to the emergency department today because of concerns for bilateral upper extremity numbness. He states that the symptoms have been getting worse for roughly the past 5 days. Today he felt like he also was having some weakness. He additionally states he has had numbness and tingling in his left leg. He denies any fevers, headaches, chest pains or palpitations. He does have a history of CVA and is currently on Plavix. He is diabetic and wonders if this could be due to neuropathy.    Past Medical History  Diagnosis Date  . High cholesterol   . Diabetes mellitus without complication ([place])   . Carotid stenosis   . S/P CABG (coronary artery bypass graft)   . CAD (coronary artery disease)   . Hypothyroidism   . HTN (hypertension)   . Skin cancer     Patient Active Problem List   Diagnosis Date Noted  . CVA (cerebral infarction) 05/31/2015    Past Surgical History  Procedure Laterality Date  . Coronary artery bypass graft      Current Outpatient Rx  Name  Route  Sig  Dispense  Refill  . atorvastatin (LIPITOR) 40 MG tablet   Oral   Take 1 tablet by mouth daily.         . chlorpheniramine-HYDROcodone (TUSSIONEX) 10-8 MG/5ML SUER   Oral   Take 5 mLs by mouth every 12 (twelve) hours as needed.          . clopidogrel (PLAVIX) 75 MG tablet   Oral   Take 1 tablet (75 mg total) by mouth daily.   30 tablet   0   . Glucosamine-Chondroitin 500-400 MG CAPS   Oral   Take 1 tablet by mouth daily.         Marland Kitchen levothyroxine (SYNTHROID, LEVOTHROID) 112 MCG tablet   Oral   Take 1 tablet by mouth daily.         Marland Kitchen lisinopril  (PRINIVIL,ZESTRIL) 2.5 MG tablet   Oral   Take 1 tablet by mouth daily.         . metFORMIN (GLUCOPHAGE) 500 MG tablet   Oral   Take 1 tablet by mouth daily.         Marland Kitchen NEXIUM 40 MG capsule   Oral   Take 1 capsule by mouth daily.           Dispense as written.     Allergies Review of patient's allergies indicates no known allergies.  Family History  Problem Relation Age of Onset  . CAD Brother   . Heart block Brother     Social History Social History  Substance Use Topics  . Smoking status: Former Research scientist (life sciences)  . Smokeless tobacco: None  . Alcohol Use: No    Review of Systems  Constitutional: Negative for fever. Cardiovascular: Negative for chest pain. Respiratory: Negative for shortness of breath. Gastrointestinal: Negative for abdominal pain, vomiting and diarrhea. Neurological: Positive for bilateral upper extremity numbness.  10-point ROS otherwise negative.  ____________________________________________   PHYSICAL EXAM:  VITAL SIGNS: ED Triage Vitals  Enc Vitals Group     BP 12/16/15 1251 126/66 mmHg     Pulse Rate  12/16/15 1251 66     Resp 12/16/15 1251 18     Temp 12/16/15 1251 97.6 F (36.4 C)     Temp Source 12/16/15 1251 Oral     SpO2 12/16/15 1251 100 %     Weight 12/16/15 1251 160 lb (72.576 kg)     Height 12/16/15 1251 5\' 8"  (1.727 m)     Head Cir --      Peak Flow --      Pain Score 12/16/15 1251 0   Constitutional: Alert and oriented. Well appearing and in no distress. Eyes: Conjunctivae are normal. PERRL. Normal extraocular movements. ENT   Head: Normocephalic and atraumatic.   Nose: No congestion/rhinnorhea.   Mouth/Throat: Mucous membranes are moist.   Neck: No stridor. Hematological/Lymphatic/Immunilogical: No cervical lymphadenopathy. Cardiovascular: Normal rate, regular rhythm.  No murmurs, rubs, or gallops. Respiratory: Normal respiratory effort without tachypnea nor retractions. Breath sounds are clear and  equal bilaterally. No wheezes/rales/rhonchi. Gastrointestinal: Soft and nontender. No distention.  Genitourinary: Deferred Musculoskeletal: Normal range of motion in all extremities. No joint effusions.  No lower extremity tenderness nor edema. Neurologic:  Normal speech and language. No gross focal neurologic deficits are appreciated.  Skin:  Skin is warm, dry and intact. No rash noted. Psychiatric: Mood and affect are normal. Speech and behavior are normal. Patient exhibits appropriate insight and judgment.  ____________________________________________    LABS (pertinent positives/negatives)  Labs Reviewed  BASIC METABOLIC PANEL - Abnormal; Notable for the following:    Chloride 98 (*)    Glucose, Bld 167 (*)    All other components within normal limits  CBC - Abnormal; Notable for the following:    RBC 4.19 (*)    Hemoglobin 12.9 (*)    HCT 38.1 (*)    All other components within normal limits  URINALYSIS COMPLETEWITH MICROSCOPIC ([facility] ONLY) - Abnormal; Notable for the following:    Color, Urine YELLOW (*)    APPearance CLEAR (*)    Squamous Epithelial / LPF 0-5 (*)    All other components within normal limits  CBG MONITORING, ED     ____________________________________________   EKG  I, Nance Pear, attending physician, personally viewed and interpreted this EKG  EKG Time: 1316 Rate: 64 Rhythm: normal sinus rhythm Axis: normal Intervals: qtc 414 QRS: narrow ST changes: no st elevation Impression: normal ekg   ____________________________________________    RADIOLOGY  MRI cervical spine  IMPRESSION: Exam is relatively similar to the prior examination with cervical spondylotic changes, spinal stenosis and cord compression most notable C4-5 level. Slight increased signal within the cord at this level is once again noted which may represent myelomalacia or edema.  Less notable but significant degenerative changes at surrounding levels as detailed  above.  MRI brain IMPRESSION: No acute infarct or intracranial hemorrhage.  Remote tiny posterior left frontal lobe infarct.  Remote tiny left paracentral pontine infarct.  Mild chronic microvascular changes.  Mild global atrophy without hydrocephalus.   ____________________________________________   PROCEDURES  Procedure(s) performed: None  Critical Care performed: No  ____________________________________________   INITIAL IMPRESSION / ASSESSMENT AND PLAN / ED COURSE  Pertinent labs & imaging results that were available during my care of the patient were reviewed by me and considered in my medical decision making (see chart for details).  Patient presented to the emergency department today because of concerns for bilateral upper extremity numbness and some left foot numbness. I did obtain an MRI of the brain in the neck which did show some  significant cervical spinal stenosis. Discussed this with Dr. [name] with [facility] Neurosurgery. Given somewhat chronic nature of the problem which was also seen on MRI in December will have patient follow-up as an outpatient. Will put patient on steroids. I did discuss with patient return precautions for worsening numbness or weakness or difficulty with gait. Patient was comfortable with plan.  ____________________________________________   FINAL CLINICAL IMPRESSION(S) / ED DIAGNOSES  Final diagnoses:  Paresthesias  Cervical stenosis of spine     Note: This dictation was prepared with Dragon dictation. Any transcriptional errors that result from this process are unintentional    Nance Pear, MD 12/16/15 2040

## 2015-12-16 NOTE — ED Notes (Signed)
Patient presents to the ED with numbness and tingling in both arms and in left leg.  Patient states symptoms started about 5 days ago and have been intermittent.  Patient also reports feeling weak and unsteady.  Patient denies difficulty with speech or confusion.  Patient is in no obvious distress at this time.

## 2015-12-16 NOTE — ED Notes (Signed)
Patient to MRI.

## 2015-12-16 NOTE — Discharge Instructions (Signed)
Spinal Stenosis Spinal stenosis is an abnormal narrowing of the canals of your spine (vertebrae). CAUSES  Spinal stenosis is caused by areas of bone pushing into the central canals of your vertebrae. This condition can be present at birth (congenital). It also may be caused by arthritic deterioration of your vertebrae (spinal degeneration).  SYMPTOMS   Pain that is generally worse with activities, particularly standing and walking.  Numbness, tingling, hot or cold sensations, weakness, or weariness in your legs.  Frequent episodes of falling.  A foot-slapping gait that leads to muscle weakness. DIAGNOSIS  Spinal stenosis is diagnosed with the use of magnetic resonance imaging (MRI) or computed tomography (CT). TREATMENT  Initial therapy for spinal stenosis focuses on the management of the pain and other symptoms associated with the condition. These therapies include:  Practicing postural changes to lessen pressure on your nerves.  Exercises to strengthen the core of your body.  Loss of excess body weight.  The use of nonsteroidal anti-inflammatory medicines to reduce swelling and inflammation in your nerves. When therapies to manage pain are not successful, surgery to treat spinal stenosis may be recommended. This surgery involves removing excess bone, which puts pressure on your nerve roots. During this surgery (laminectomy), the posterior boney arch (lamina) and excess bone around the facet joints are removed.   This information is not intended to replace advice given to you by your health care provider. Make sure you discuss any questions you have with your health care provider.   Document Released: 08/25/2003 Document Revised: 06/25/2014 Document Reviewed: 09/12/2012 Elsevier Interactive Patient Education 2016 Elsevier Inc.  

## 2015-12-22 ENCOUNTER — Other Ambulatory Visit: Payer: Self-pay | Admitting: Neurosurgery

## 2016-01-09 ENCOUNTER — Encounter (HOSPITAL_COMMUNITY)
Admission: RE | Admit: 2016-01-09 | Discharge: 2016-01-09 | Disposition: A | Discharge status: Home / Self Care | Payer: Medicare Other | Source: Ambulatory Visit | Attending: Neurosurgery | Admitting: Neurosurgery

## 2016-01-09 ENCOUNTER — Encounter (HOSPITAL_COMMUNITY): Payer: Self-pay

## 2016-01-09 DIAGNOSIS — Z01818 Encounter for other preprocedural examination: Secondary | ICD-10-CM | POA: Insufficient documentation

## 2016-01-09 DIAGNOSIS — E7800 Pure hypercholesterolemia, unspecified: Secondary | ICD-10-CM | POA: Diagnosis not present

## 2016-01-09 DIAGNOSIS — I2510 Atherosclerotic heart disease of native coronary artery without angina pectoris: Secondary | ICD-10-CM | POA: Diagnosis not present

## 2016-01-09 DIAGNOSIS — Z7902 Long term (current) use of antithrombotics/antiplatelets: Secondary | ICD-10-CM | POA: Insufficient documentation

## 2016-01-09 DIAGNOSIS — I10 Essential (primary) hypertension: Secondary | ICD-10-CM | POA: Insufficient documentation

## 2016-01-09 DIAGNOSIS — Z85828 Personal history of other malignant neoplasm of skin: Secondary | ICD-10-CM | POA: Insufficient documentation

## 2016-01-09 DIAGNOSIS — E039 Hypothyroidism, unspecified: Secondary | ICD-10-CM | POA: Insufficient documentation

## 2016-01-09 DIAGNOSIS — Z79899 Other long term (current) drug therapy: Secondary | ICD-10-CM | POA: Insufficient documentation

## 2016-01-09 DIAGNOSIS — Z951 Presence of aortocoronary bypass graft: Secondary | ICD-10-CM | POA: Insufficient documentation

## 2016-01-09 DIAGNOSIS — Z87891 Personal history of nicotine dependence: Secondary | ICD-10-CM | POA: Diagnosis not present

## 2016-01-09 DIAGNOSIS — Z8673 Personal history of transient ischemic attack (TIA), and cerebral infarction without residual deficits: Secondary | ICD-10-CM | POA: Insufficient documentation

## 2016-01-09 DIAGNOSIS — Z0183 Encounter for blood typing: Secondary | ICD-10-CM | POA: Diagnosis not present

## 2016-01-09 DIAGNOSIS — G959 Disease of spinal cord, unspecified: Secondary | ICD-10-CM | POA: Insufficient documentation

## 2016-01-09 DIAGNOSIS — K219 Gastro-esophageal reflux disease without esophagitis: Secondary | ICD-10-CM | POA: Diagnosis not present

## 2016-01-09 DIAGNOSIS — E119 Type 2 diabetes mellitus without complications: Secondary | ICD-10-CM | POA: Diagnosis not present

## 2016-01-09 DIAGNOSIS — Z7984 Long term (current) use of oral hypoglycemic drugs: Secondary | ICD-10-CM | POA: Diagnosis not present

## 2016-01-09 DIAGNOSIS — Z01812 Encounter for preprocedural laboratory examination: Secondary | ICD-10-CM | POA: Insufficient documentation

## 2016-01-09 HISTORY — DX: Personal history of urinary calculi: Z87.442

## 2016-01-09 HISTORY — DX: Cerebral infarction, unspecified: I63.9

## 2016-01-09 HISTORY — DX: Gastro-esophageal reflux disease without esophagitis: K21.9

## 2016-01-09 LAB — CBC
HEMATOCRIT: 40.2 % (ref 39.0–52.0)
HEMOGLOBIN: 12.9 g/dL — AB (ref 13.0–17.0)
MCH: 29.7 pg (ref 26.0–34.0)
MCHC: 32.1 g/dL (ref 30.0–36.0)
MCV: 92.4 fL (ref 78.0–100.0)
Platelets: 183 10*3/uL (ref 150–400)
RBC: 4.35 MIL/uL (ref 4.22–5.81)
RDW: 12.7 % (ref 11.5–15.5)
WBC: 6.7 10*3/uL (ref 4.0–10.5)

## 2016-01-09 LAB — BASIC METABOLIC PANEL
Anion gap: 5 (ref 5–15)
BUN: 8 mg/dL (ref 6–20)
CALCIUM: 9.7 mg/dL (ref 8.9–10.3)
CHLORIDE: 103 mmol/L (ref 101–111)
CO2: 29 mmol/L (ref 22–32)
CREATININE: 0.76 mg/dL (ref 0.61–1.24)
Glucose, Bld: 152 mg/dL — ABNORMAL HIGH (ref 65–99)
POTASSIUM: 4.1 mmol/L (ref 3.5–5.1)
SODIUM: 137 mmol/L (ref 135–145)

## 2016-01-09 LAB — TYPE AND SCREEN
ABO/RH(D): O POS
Antibody Screen: NEGATIVE

## 2016-01-09 LAB — SURGICAL PCR SCREEN
MRSA, PCR: NEGATIVE
Staphylococcus aureus: NEGATIVE

## 2016-01-09 LAB — ABO/RH: ABO/RH(D): O POS

## 2016-01-09 LAB — GLUCOSE, CAPILLARY: Glucose-Capillary: 160 mg/dL — ABNORMAL HIGH (ref 65–99)

## 2016-01-09 NOTE — Pre-Procedure Instructions (Addendum)
Gary Craig  01/09/2016      [facility] [place], [address] [place] [address] Phone: [phone] Fax: [phone]  [id number] [place], [place] - [address] [place] [address] Phone: [phone] Fax: [phone]  [facility] [place], [place] - [address] [place] [address] Phone: [phone] Fax: [phone]    Your procedure is scheduled on 01/16/16  Report to [place] at 800 A.M.  Call this number if you have problems the morning of surgery:  [phone]   Remember:  Do not eat food or drink liquids after midnight.  Take these medicines the morning of surgery with A SIP OF WATER levothyroxine  STOP all herbel meds, nsaids (aleve,naproxen,advil,ibuprofen) 5 days prior to surgery including all vitamins,fish oil, glucosamine-chondroitin.aspirin  No metformin day of surgery  Stop plavix per dr (01/09/16)     How to Manage Your Diabetes Before and After Surgery  Why is it important to control my blood sugar before and after surgery? . Improving blood sugar levels before and after surgery helps healing and can limit problems. . A way of improving blood sugar control is eating a healthy diet by: o  Eating less sugar and carbohydrates o  Increasing activity/exercise o  Talking with your doctor about reaching your blood sugar goals . High blood sugars (greater than 180 mg/dL) can raise your risk of infections and slow your recovery, so you will need to focus on controlling your diabetes during the weeks before surgery. . Make sure that the doctor who takes care of your diabetes knows about your planned surgery including the date and location.  How do I manage my blood sugar before surgery? . Check your blood sugar at least 4 times a day, starting 2 days before  surgery, to make sure that the level is not too high or low. o Check your blood sugar the morning of your surgery when you wake up and every 2 hours until you get to the Short Stay unit. . If your blood sugar is less than 70 mg/dL, you will need to treat for low blood sugar: o Do not take insulin. o Treat a low blood sugar (less than 70 mg/dL) with  cup of clear juice (cranberry or apple), 4 glucose tablets, OR glucose gel. o Recheck blood sugar in 15 minutes after treatment (to make sure it is greater than 70 mg/dL). If your blood sugar is not greater than 70 mg/dL on recheck, call [phone] for further instructions. . Report your blood sugar to the short stay nurse when you get to Short Stay.  . If you are admitted to the hospital after surgery: o Your blood sugar will be checked by the staff and you will probably be given insulin after surgery (instead of oral diabetes medicines) to make sure you have good blood sugar levels. o The goal for blood sugar control after surgery is 80-180 mg/dL.   WHAT DO I DO ABOUT MY DIABETES MEDICATION?   Marland Kitchen Do not take oral diabetes medicines (pills) the morning of surgery.(no metformin)  .      Do not wear jewelry, make-up or nail polish.  Do not wear lotions, powders, or perfumes.  You may wear deoderant.  Do not shave 48 hours prior to surgery.  Men  may shave face and neck.  Do not bring valuables to the hospital.  [facility] is not responsible for any belongings or valuables.  Contacts, dentures or bridgework may not be worn into surgery.  Leave your suitcase in the car.  After surgery it may be brought to your room.  For patients admitted to the hospital, discharge time will be determined by your treatment team.  Patients discharged the day of surgery will not be allowed to drive home.   Name and phone number of your driver:    Special instructions:   Special Instructions:  - Preparing for Surgery  Before surgery, you can play  an important role.  Because skin is not sterile, your skin needs to be as free of germs as possible.  You can reduce the number of germs on you skin by washing with CHG (chlorahexidine gluconate) soap before surgery.  CHG is an antiseptic cleaner which kills germs and bonds with the skin to continue killing germs even after washing.  Please DO NOT use if you have an allergy to CHG or antibacterial soaps.  If your skin becomes reddened/irritated stop using the CHG and inform your nurse when you arrive at Short Stay.  Do not shave (including legs and underarms) for at least 48 hours prior to the first CHG shower.  You may shave your face.  Please follow these instructions carefully:   1.  Shower with CHG Soap the night before surgery and the morning of Surgery.  2.  If you choose to wash your hair, wash your hair first as usual with your normal shampoo.  3.  After you shampoo, rinse your hair and body thoroughly to remove the Shampoo.  4.  Use CHG as you would any other liquid soap.  You can apply chg directly  to the skin and wash gently with scrungie or a clean washcloth.  5.  Apply the CHG Soap to your body ONLY FROM THE NECK DOWN.  Do not use on open wounds or open sores.  Avoid contact with your eyes ears, mouth and genitals (private parts).  Wash genitals (private parts)       with your normal soap.  6.  Wash thoroughly, paying special attention to the area where your surgery will be performed.  7.  Thoroughly rinse your body with warm water from the neck down.  8.  DO NOT shower/wash with your normal soap after using and rinsing off the CHG Soap.  9.  Pat yourself dry with a clean towel.            10.  Wear clean pajamas.            11.  Place clean sheets on your bed the night of your first shower and do not sleep with pets.  Day of Surgery  Do not apply any lotions/deodorants the morning of surgery.  Please wear clean clothes to the hospital/surgery center.  Please read over the  following fact sheets that you were given. Pain Booklet and MRSA Information

## 2016-01-10 LAB — HEMOGLOBIN A1C
Hgb A1c MFr Bld: 6.8 % — ABNORMAL HIGH (ref 4.8–5.6)
Mean Plasma Glucose: 148 mg/dL

## 2016-01-11 NOTE — Progress Notes (Signed)
Anesthesia Chart Review: Patient is a 82 year old male scheduled for C4-5 ACDF on 01/16/16 by Dr. [name].  History includes former smoker, hypercholesterolemia, diabetes mellitus type 2, carotid artery stenosis (< 50% 05/2015), CAD S/P CABG '06, hypothyroidism, hypertension, skin cancer, CVA 05/2015 and 2012, nephrolithiasis, GERD, tonsillectomy.  PCP is Dr. Shellee Milo. Cardiologist is Dr. Nehemiah Massed, last visit 12/28/15. He wrote, "The patient is at lowest risk possible for cardiovascular complications with surgical intervention and/or invasive procedure. Currently has no evidence active and/or significant angina and/or congestive heart failure. The patient may discontinue plavix 7 days prior to procedure and restart at a safe period thereafter."  Meds include Lipitor, Plavix, levothyroxine, lisinopril, metformin, fish oil, ranitidine. Plavix on hold starting 01/09/16.  12/16/15 EKG: Normal sinus rhythm.  05/31/15 Echo: Study Conclusions - Procedure narrative: Transthoracic echocardiography. Image   quality was poor. The study was technically difficult, as a   result of poor acoustic windows. - Left ventricle: Systolic function was normal. The estimated   ejection fraction was in the range of 60% to 65%. - Aortic valve: Valve area (Vmax): 1.77 cm^2.  05/31/15 Carotid U/S: IMPRESSION: 1. Bilateral proximal ICA plaque, resulting in less than 50% diameter stenosis. The exam does not exclude plaque ulceration or embolization. Continued surveillance recommended.  12/16/15 MRI Brain: IMPRESSION: No acute infarct or intracranial hemorrhage. Remote tiny posterior left frontal lobe infarct. Remote tiny left paracentral pontine infarct. Mild chronic microvascular changes. Mild global atrophy without hydrocephalus.  Preoperative labs noted. Hemoglobin A1c 6.8.  Patient with recent cardiology evaluation with clearance. EF normal last December. Functional capacity was documented as > 4 METS. If no acute  changes then I would anticipate that he can proceed as planned.  George Hugh [facility] Short Stay Center/Anesthesiology Phone [phone] 01/11/2016 6:28 PM

## 2016-01-15 MED ORDER — CEFAZOLIN SODIUM-DEXTROSE 2-4 GM/100ML-% IV SOLN
2.0000 g | INTRAVENOUS | Status: AC
  Administered 2016-01-16: 2 g via INTRAVENOUS
  Filled 2016-01-15: qty 100

## 2016-01-16 ENCOUNTER — Ambulatory Visit (HOSPITAL_COMMUNITY): Payer: Medicare Other

## 2016-01-16 ENCOUNTER — Ambulatory Visit (HOSPITAL_COMMUNITY): Payer: Medicare Other | Admitting: Vascular Surgery

## 2016-01-16 ENCOUNTER — Ambulatory Visit (HOSPITAL_COMMUNITY): Payer: Medicare Other | Admitting: Critical Care Medicine

## 2016-01-16 ENCOUNTER — Ambulatory Visit (HOSPITAL_COMMUNITY)
Admission: RE | Admit: 2016-01-16 | Discharge: 2016-01-16 | Disposition: A | Discharge status: Home / Self Care | Payer: Medicare Other | Source: Ambulatory Visit | Attending: Neurosurgery | Admitting: Neurosurgery

## 2016-01-16 ENCOUNTER — Encounter (HOSPITAL_COMMUNITY)
Admission: RE | Disposition: A | Discharge status: Home / Self Care | Payer: Self-pay | Source: Ambulatory Visit | Attending: Neurosurgery

## 2016-01-16 ENCOUNTER — Encounter (HOSPITAL_COMMUNITY): Payer: Self-pay | Admitting: Anesthesiology

## 2016-01-16 DIAGNOSIS — E119 Type 2 diabetes mellitus without complications: Secondary | ICD-10-CM | POA: Insufficient documentation

## 2016-01-16 DIAGNOSIS — K219 Gastro-esophageal reflux disease without esophagitis: Secondary | ICD-10-CM | POA: Diagnosis not present

## 2016-01-16 DIAGNOSIS — Z79899 Other long term (current) drug therapy: Secondary | ICD-10-CM | POA: Insufficient documentation

## 2016-01-16 DIAGNOSIS — Z419 Encounter for procedure for purposes other than remedying health state, unspecified: Secondary | ICD-10-CM

## 2016-01-16 DIAGNOSIS — M4802 Spinal stenosis, cervical region: Secondary | ICD-10-CM | POA: Diagnosis not present

## 2016-01-16 DIAGNOSIS — Z7902 Long term (current) use of antithrombotics/antiplatelets: Secondary | ICD-10-CM | POA: Diagnosis not present

## 2016-01-16 DIAGNOSIS — Z7984 Long term (current) use of oral hypoglycemic drugs: Secondary | ICD-10-CM | POA: Diagnosis not present

## 2016-01-16 DIAGNOSIS — I2510 Atherosclerotic heart disease of native coronary artery without angina pectoris: Secondary | ICD-10-CM | POA: Diagnosis not present

## 2016-01-16 DIAGNOSIS — M4712 Other spondylosis with myelopathy, cervical region: Secondary | ICD-10-CM | POA: Diagnosis not present

## 2016-01-16 DIAGNOSIS — E7800 Pure hypercholesterolemia, unspecified: Secondary | ICD-10-CM | POA: Diagnosis not present

## 2016-01-16 DIAGNOSIS — Z8673 Personal history of transient ischemic attack (TIA), and cerebral infarction without residual deficits: Secondary | ICD-10-CM | POA: Diagnosis not present

## 2016-01-16 DIAGNOSIS — I10 Essential (primary) hypertension: Secondary | ICD-10-CM | POA: Diagnosis not present

## 2016-01-16 DIAGNOSIS — Z7952 Long term (current) use of systemic steroids: Secondary | ICD-10-CM | POA: Insufficient documentation

## 2016-01-16 DIAGNOSIS — Z87891 Personal history of nicotine dependence: Secondary | ICD-10-CM | POA: Diagnosis not present

## 2016-01-16 DIAGNOSIS — Z951 Presence of aortocoronary bypass graft: Secondary | ICD-10-CM | POA: Insufficient documentation

## 2016-01-16 DIAGNOSIS — E039 Hypothyroidism, unspecified: Secondary | ICD-10-CM | POA: Diagnosis not present

## 2016-01-16 HISTORY — PX: ANTERIOR CERVICAL DECOMP/DISCECTOMY FUSION: SHX1161

## 2016-01-16 LAB — GLUCOSE, CAPILLARY
GLUCOSE-CAPILLARY: 176 mg/dL — AB (ref 65–99)
GLUCOSE-CAPILLARY: 264 mg/dL — AB (ref 65–99)
Glucose-Capillary: 158 mg/dL — ABNORMAL HIGH (ref 65–99)
Glucose-Capillary: 141 mg/dL — ABNORMAL HIGH (ref 65–99)

## 2016-01-16 SURGERY — ANTERIOR CERVICAL DECOMPRESSION/DISCECTOMY FUSION 1 LEVEL
Anesthesia: General | Site: Spine Cervical

## 2016-01-16 MED ORDER — SODIUM CHLORIDE 0.9 % IV SOLN: 250.0000 mL | INTRAVENOUS | Status: DC

## 2016-01-16 MED ORDER — DEXAMETHASONE SODIUM PHOSPHATE 10 MG/ML IJ SOLN
INTRAMUSCULAR | Status: AC
  Filled 2016-01-16: qty 1

## 2016-01-16 MED ORDER — ACETAMINOPHEN 325 MG PO TABS: 650.0000 mg | ORAL_TABLET | ORAL | Status: DC | PRN

## 2016-01-16 MED ORDER — CYCLOBENZAPRINE HCL 10 MG PO TABS: 10.0000 mg | ORAL_TABLET | Freq: Three times a day (TID) | ORAL | Status: DC | PRN

## 2016-01-16 MED ORDER — CLOPIDOGREL BISULFATE 75 MG PO TABS: 75.0000 mg | ORAL_TABLET | Freq: Every day | ORAL | Status: DC

## 2016-01-16 MED ORDER — CEFAZOLIN SODIUM-DEXTROSE 2-4 GM/100ML-% IV SOLN
2.0000 g | Freq: Three times a day (TID) | INTRAVENOUS | Status: DC
  Administered 2016-01-16: 2 g via INTRAVENOUS
  Filled 2016-01-16: qty 100

## 2016-01-16 MED ORDER — ONDANSETRON HCL 4 MG/2ML IJ SOLN: 4.0000 mg | INTRAMUSCULAR | Status: DC | PRN

## 2016-01-16 MED ORDER — ROCURONIUM BROMIDE 100 MG/10ML IV SOLN
INTRAVENOUS | Status: DC | PRN
  Administered 2016-01-16: 40 mg via INTRAVENOUS
  Administered 2016-01-16: 10 mg via INTRAVENOUS

## 2016-01-16 MED ORDER — FENTANYL CITRATE (PF) 100 MCG/2ML IJ SOLN
INTRAMUSCULAR | Status: DC | PRN
  Administered 2016-01-16: 150 ug via INTRAVENOUS
  Administered 2016-01-16: 100 ug via INTRAVENOUS

## 2016-01-16 MED ORDER — MIDAZOLAM HCL 2 MG/2ML IJ SOLN
INTRAMUSCULAR | Status: AC
  Filled 2016-01-16: qty 2

## 2016-01-16 MED ORDER — NEOSTIGMINE METHYLSULFATE 10 MG/10ML IV SOLN
INTRAVENOUS | Status: DC | PRN
  Administered 2016-01-16: 4 mg via INTRAVENOUS

## 2016-01-16 MED ORDER — DEXAMETHASONE SODIUM PHOSPHATE 10 MG/ML IJ SOLN
INTRAMUSCULAR | Status: DC | PRN
  Administered 2016-01-16: 10 mg via INTRAVENOUS

## 2016-01-16 MED ORDER — FENTANYL CITRATE (PF) 250 MCG/5ML IJ SOLN
INTRAMUSCULAR | Status: AC
  Filled 2016-01-16: qty 5

## 2016-01-16 MED ORDER — KETOROLAC TROMETHAMINE 30 MG/ML IJ SOLN: 30.0000 mg | Freq: Once | INTRAMUSCULAR | Status: DC

## 2016-01-16 MED ORDER — SODIUM CHLORIDE 0.9 % IR SOLN
Status: DC | PRN
  Administered 2016-01-16: 500 mL

## 2016-01-16 MED ORDER — PHENYLEPHRINE HCL 10 MG/ML IJ SOLN
INTRAMUSCULAR | Status: DC | PRN
  Administered 2016-01-16: 25 ug/min via INTRAVENOUS

## 2016-01-16 MED ORDER — ONDANSETRON HCL 4 MG/2ML IJ SOLN
INTRAMUSCULAR | Status: DC | PRN
  Administered 2016-01-16: 4 mg via INTRAVENOUS

## 2016-01-16 MED ORDER — LEVOTHYROXINE SODIUM 112 MCG PO TABS
112.0000 ug | ORAL_TABLET | Freq: Every day | ORAL | Status: DC
  Filled 2016-01-16: qty 1

## 2016-01-16 MED ORDER — ONDANSETRON HCL 4 MG/2ML IJ SOLN: 4.0000 mg | Freq: Once | INTRAMUSCULAR | Status: DC | PRN

## 2016-01-16 MED ORDER — LISINOPRIL 2.5 MG PO TABS
2.5000 mg | ORAL_TABLET | Freq: Every day | ORAL | Status: DC
  Administered 2016-01-16: 2.5 mg via ORAL
  Filled 2016-01-16 (×2): qty 1

## 2016-01-16 MED ORDER — THROMBIN 5000 UNITS EX SOLR
CUTANEOUS | Status: DC | PRN
  Administered 2016-01-16 (×2): 5000 [IU] via TOPICAL

## 2016-01-16 MED ORDER — PROPOFOL 10 MG/ML IV BOLUS
INTRAVENOUS | Status: DC | PRN
  Administered 2016-01-16: 150 mg via INTRAVENOUS

## 2016-01-16 MED ORDER — LACTATED RINGERS IV SOLN
INTRAVENOUS | Status: DC
  Administered 2016-01-16: 09:00:00 via INTRAVENOUS

## 2016-01-16 MED ORDER — GLYCOPYRROLATE 0.2 MG/ML IJ SOLN
INTRAMUSCULAR | Status: DC | PRN
  Administered 2016-01-16: .6 mg via INTRAVENOUS

## 2016-01-16 MED ORDER — MEPERIDINE HCL 25 MG/ML IJ SOLN: 6.2500 mg | INTRAMUSCULAR | Status: DC | PRN

## 2016-01-16 MED ORDER — ATORVASTATIN CALCIUM 20 MG PO TABS
40.0000 mg | ORAL_TABLET | Freq: Every day | ORAL | Status: DC
  Administered 2016-01-16: 40 mg via ORAL
  Filled 2016-01-16: qty 2
  Filled 2016-01-16: qty 1

## 2016-01-16 MED ORDER — 0.9 % SODIUM CHLORIDE (POUR BTL) OPTIME
TOPICAL | Status: DC | PRN
  Administered 2016-01-16: 1000 mL

## 2016-01-16 MED ORDER — OXYCODONE-ACETAMINOPHEN 5-325 MG PO TABS: 1.0000 | ORAL_TABLET | ORAL | Status: DC | PRN

## 2016-01-16 MED ORDER — CHLORHEXIDINE GLUCONATE CLOTH 2 % EX PADS: 6.0000 | MEDICATED_PAD | Freq: Once | CUTANEOUS | Status: DC

## 2016-01-16 MED ORDER — PHENOL 1.4 % MT LIQD: 1.0000 | OROMUCOSAL | Status: DC | PRN

## 2016-01-16 MED ORDER — SODIUM CHLORIDE 0.9% FLUSH: 3.0000 mL | Freq: Two times a day (BID) | INTRAVENOUS | Status: DC

## 2016-01-16 MED ORDER — ONDANSETRON HCL 4 MG/2ML IJ SOLN
INTRAMUSCULAR | Status: AC
  Filled 2016-01-16: qty 2

## 2016-01-16 MED ORDER — MENTHOL 3 MG MT LOZG: 1.0000 | LOZENGE | OROMUCOSAL | Status: DC | PRN

## 2016-01-16 MED ORDER — METFORMIN HCL 500 MG PO TABS
500.0000 mg | ORAL_TABLET | Freq: Every day | ORAL | Status: DC
  Administered 2016-01-16: 500 mg via ORAL
  Filled 2016-01-16: qty 1

## 2016-01-16 MED ORDER — LIDOCAINE HCL (CARDIAC) 20 MG/ML IV SOLN
INTRAVENOUS | Status: DC | PRN
  Administered 2016-01-16: 80 mg via INTRATRACHEAL

## 2016-01-16 MED ORDER — HYDROMORPHONE HCL 1 MG/ML IJ SOLN: 0.5000 mg | INTRAMUSCULAR | Status: DC | PRN

## 2016-01-16 MED ORDER — HEMOSTATIC AGENTS (NO CHARGE) OPTIME
TOPICAL | Status: DC | PRN
  Administered 2016-01-16: 1 via TOPICAL

## 2016-01-16 MED ORDER — FAMOTIDINE 20 MG PO TABS
10.0000 mg | ORAL_TABLET | Freq: Every day | ORAL | Status: DC
  Filled 2016-01-16: qty 1

## 2016-01-16 MED ORDER — LIDOCAINE 2% (20 MG/ML) 5 ML SYRINGE
INTRAMUSCULAR | Status: AC
  Filled 2016-01-16: qty 15

## 2016-01-16 MED ORDER — ADULT MULTIVITAMIN W/MINERALS CH
1.0000 | ORAL_TABLET | Freq: Every day | ORAL | Status: DC
  Administered 2016-01-16: 1 via ORAL
  Filled 2016-01-16: qty 1

## 2016-01-16 MED ORDER — SODIUM CHLORIDE 0.9% FLUSH: 3.0000 mL | INTRAVENOUS | Status: DC | PRN

## 2016-01-16 MED ORDER — THROMBIN 5000 UNITS EX SOLR
OROMUCOSAL | Status: DC | PRN
  Administered 2016-01-16: 5 mL via TOPICAL

## 2016-01-16 MED ORDER — PROPOFOL 10 MG/ML IV BOLUS
INTRAVENOUS | Status: AC
  Filled 2016-01-16: qty 20

## 2016-01-16 MED ORDER — GLUCOSAMINE-CHONDROITIN 500-400 MG PO CAPS: 1.0000 | ORAL_CAPSULE | Freq: Every day | ORAL | Status: DC

## 2016-01-16 MED ORDER — ACETAMINOPHEN 650 MG RE SUPP: 650.0000 mg | RECTAL | Status: DC | PRN

## 2016-01-16 MED ORDER — FENTANYL CITRATE (PF) 100 MCG/2ML IJ SOLN: 25.0000 ug | INTRAMUSCULAR | Status: DC | PRN

## 2016-01-16 MED ORDER — ROCURONIUM BROMIDE 50 MG/5ML IV SOLN
INTRAVENOUS | Status: AC
  Filled 2016-01-16: qty 1

## 2016-01-16 SURGICAL SUPPLY — 59 items
BAG DECANTER FOR FLEXI CONT (MISCELLANEOUS) ×3 IMPLANT
BENZOIN TINCTURE PRP APPL 2/3 (GAUZE/BANDAGES/DRESSINGS) ×3 IMPLANT
BIT DRILL SPINE QC 14 (BIT) ×3 IMPLANT
BRUSH SCRUB EZ PLAIN DRY (MISCELLANEOUS) ×3 IMPLANT
BUR MATCHSTICK NEURO 3.0 LAGG (BURR) ×3 IMPLANT
CANISTER SUCT 3000ML PPV (MISCELLANEOUS) ×3 IMPLANT
CLOSURE WOUND 1/2 X4 (GAUZE/BANDAGES/DRESSINGS) ×1
DRAPE C-ARM 42X72 X-RAY (DRAPES) ×6 IMPLANT
DRAPE LAPAROTOMY 100X72 PEDS (DRAPES) ×3 IMPLANT
DRAPE MICROSCOPE LEICA (MISCELLANEOUS) ×3 IMPLANT
DRAPE POUCH INSTRU U-SHP 10X18 (DRAPES) ×3 IMPLANT
DRSG OPSITE POSTOP 4X6 (GAUZE/BANDAGES/DRESSINGS) ×3 IMPLANT
DURAPREP 6ML APPLICATOR 50/CS (WOUND CARE) ×3 IMPLANT
ELECT COATED BLADE 2.86 ST (ELECTRODE) ×3 IMPLANT
ELECT REM PT RETURN 9FT ADLT (ELECTROSURGICAL) ×3
ELECTRODE REM PT RTRN 9FT ADLT (ELECTROSURGICAL) ×1 IMPLANT
GAUZE SPONGE 4X4 12PLY STRL (GAUZE/BANDAGES/DRESSINGS) IMPLANT
GAUZE SPONGE 4X4 16PLY XRAY LF (GAUZE/BANDAGES/DRESSINGS) IMPLANT
GLOVE BIO SURGEON STRL SZ 6.5 (GLOVE) ×4 IMPLANT
GLOVE BIO SURGEON STRL SZ8 (GLOVE) ×3 IMPLANT
GLOVE BIO SURGEONS STRL SZ 6.5 (GLOVE) ×2
GLOVE BIOGEL PI IND STRL 6.5 (GLOVE) ×1 IMPLANT
GLOVE BIOGEL PI INDICATOR 6.5 (GLOVE) ×2
GLOVE ECLIPSE 9.0 STRL (GLOVE) ×3 IMPLANT
GLOVE EXAM NITRILE LRG STRL (GLOVE) IMPLANT
GLOVE EXAM NITRILE MD LF STRL (GLOVE) IMPLANT
GLOVE EXAM NITRILE XL STR (GLOVE) IMPLANT
GLOVE EXAM NITRILE XS STR PU (GLOVE) IMPLANT
GLOVE INDICATOR 8.5 STRL (GLOVE) ×3 IMPLANT
GOWN STRL REUS W/ TWL LRG LVL3 (GOWN DISPOSABLE) ×1 IMPLANT
GOWN STRL REUS W/ TWL XL LVL3 (GOWN DISPOSABLE) ×2 IMPLANT
GOWN STRL REUS W/TWL 2XL LVL3 (GOWN DISPOSABLE) IMPLANT
GOWN STRL REUS W/TWL LRG LVL3 (GOWN DISPOSABLE) ×2
GOWN STRL REUS W/TWL XL LVL3 (GOWN DISPOSABLE) ×4
HALTER HD/CHIN CERV TRACTION D (MISCELLANEOUS) ×3 IMPLANT
HEMOSTAT POWDER KIT SURGIFOAM (HEMOSTASIS) ×3 IMPLANT
KIT BASIN OR (CUSTOM PROCEDURE TRAY) ×3 IMPLANT
KIT ROOM TURNOVER OR (KITS) ×3 IMPLANT
LIQUID BAND (GAUZE/BANDAGES/DRESSINGS) ×3 IMPLANT
NEEDLE HYPO 18GX1.5 BLUNT FILL (NEEDLE) IMPLANT
NEEDLE SPNL 20GX3.5 QUINCKE YW (NEEDLE) ×3 IMPLANT
NS IRRIG 1000ML POUR BTL (IV SOLUTION) ×3 IMPLANT
PACK LAMINECTOMY NEURO (CUSTOM PROCEDURE TRAY) ×3 IMPLANT
PAD ARMBOARD 7.5X6 YLW CONV (MISCELLANEOUS) ×9 IMPLANT
PIN DISTRACTION 14MM (PIN) ×6 IMPLANT
PLATE ANT CERV XTEND 1 LV 14 (Plate) ×3 IMPLANT
RUBBERBAND STERILE (MISCELLANEOUS) ×6 IMPLANT
SCREW XTD VAR 4.2 SELF TAP (Screw) ×12 IMPLANT
SPACER CERV FRGE 12X14X7-0 (Spacer) ×3 IMPLANT
SPONGE INTESTINAL PEANUT (DISPOSABLE) ×3 IMPLANT
SPONGE SURGIFOAM ABS GEL SZ50 (HEMOSTASIS) ×3 IMPLANT
STRIP CLOSURE SKIN 1/2X4 (GAUZE/BANDAGES/DRESSINGS) ×2 IMPLANT
SUT VIC AB 3-0 SH 8-18 (SUTURE) ×3 IMPLANT
SUT VICRYL 4-0 PS2 18IN ABS (SUTURE) ×6 IMPLANT
TAPE CLOTH 4X10 WHT NS (GAUZE/BANDAGES/DRESSINGS) IMPLANT
TOWEL OR 17X24 6PK STRL BLUE (TOWEL DISPOSABLE) ×3 IMPLANT
TOWEL OR 17X26 10 PK STRL BLUE (TOWEL DISPOSABLE) ×3 IMPLANT
TRAP SPECIMEN MUCOUS 40CC (MISCELLANEOUS) ×3 IMPLANT
WATER STERILE IRR 1000ML POUR (IV SOLUTION) ×3 IMPLANT

## 2016-01-16 NOTE — Op Note (Signed)
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  Surgeon: [name] Dellar Traber  Asst.: Mallie Mussel pool  Anesthesia: Gen.  EBL: Minimal  History of present illness: Patient is a 82 year old gentleman has had neck pain bilateral shoulder pain pain numbness and tingling and weakness in his hands workup has revealed severe cord compression at C4-5 due to comminution of anterior posterior compression due to the predominance of the compression being anterior recommended anterior cervical discectomy and fusion due to her failure conservative treatment imaging findings and progression of clinical myelopathy. I extensively went over the risks and benefits of the operation with the patient as well as perioperative course expectations of outcome and alternatives of surgery and he understood and agreed to proceed forward.  Operative procedure: Patient brought into the or was induced under general anesthesia positioned supine the neck in slight extension in 5 pounds of halter traction the right 76 prepped and draped in routine sterile fashion. Interoperative x-ray confirmed the location appropriate level so a curvilinear incision was made just off midline to the antebrachial the sternomastoid and the superficial of platysmas dissected and divided longitudinally the avascular plane between the sternocleidomastoid and strap muscles was developed and the prevertebral fascia was dissected away with Kitners. Large anterior aspect were identified at C5-6 as well as C3-4 and subsequently identify the disc space at C4-5. 2 x-rays were selected and taken at C4-5 and at C5-6 to confirm localization. Longus close and reflected laterally and self-retaining retractor was placed. This this is incised and cleanout  pituitary rongeurs and upgoing curettes anterior aspect of did not 3 mm Kerrison punch high-speed drill was used to drill down to the annulus and posterior complex. Under microscopic illumination of very large spur coming off the posterior aspect of C4 displaced and spinal cord left of center was identified and nerve hook was used to identify the PLL and removed in piecemeal fashion this large spur was removed a combination of a 12 minute Kerrison punch. The central canal was decompressed marking laterally both C5 pedicles were identified both C5 nerve roots were decompressed flush with pedicle. At the end the discectomy there is no further stenosis either centrally or foraminally end plates were prepared to receive a 7 mm parallel allograft and a 14 Miller plate was placed all screws excellent purchase locking mechanisms were engaged. Wounds and copious irrigated meticulous in a stasis was maintained the platysmas approximate with interrupted Vicryl and skin was closed running 4 subcuticular Dermabond benzo and Steri-Strips and sterile dressing was applied and patient recovered in stable condition. At the end of case all needle counts sponge counts were correct.

## 2016-01-16 NOTE — Discharge Instructions (Signed)
No lifting bending or twisting.  No driving or riding in a car unless you are coming back and forth to see the Dr..  Gary Craig the outer dressing in 3 days, leave the steri-strips on and intact.  Cover the steris for showers to keep them dry.   Wound Care Keep incision covered and dry for one week.  If you shower prior to then, cover incision with plastic wrap.  You may remove outer bandage after one week and shower.  Do not put any creams, lotions, or ointments on incision. Leave steri-strips on neck.  They will fall off by themselves. Activity Walk each and every day, increasing distance each day. No lifting greater than 5 lbs.  Avoid excessive neck motion. No driving for 2 weeks; may ride as a passenger locally. Wear neck brace at all times except when showering or otherwise instructed. Diet Resume your normal diet.  Return to Work Will be discussed at you follow up appointment. Call Your Doctor If Any of These Occur Redness, drainage, or swelling at the wound.  Temperature greater than 101 degrees. Severe pain not relieved by pain medication. Increased difficulty swallowing.  Incision starts to come apart. Follow Up Appt Call today for appointment in 1-2 weeks [id number]) or for problems.  If you have any hardware placed in your spine, you will need an x-ray before your appointment.

## 2016-01-16 NOTE — H&P (Signed)
Gary Craig is an 82 y.o. male.   Chief Complaint: Neck pain bilateral hand numbness tingling HPI: Patient is a very pleasant Gary Craig who's had neck pain and numbness tingling weakness in his hands. Workup has revealed severe stenosis cord compression at C4-5 from both anterior posterior compression. Due to patient takes her treatment, clinical exam consistent with a progressive myelopathy and imaging findings recommended anterior cervical discectomy and fusion. I also explained is possibility down the road we may have to go posteriorly depending on her symptoms evolved after anterior decompression. I extensively reviewed the risks and benefits of the procedure with him as well as perioperative course expectations of outcome and alternatives of surgery and he understands and agrees to proceed forward.  Past Medical History:  Diagnosis Date  . CAD (coronary artery disease)   . Carotid stenosis   . Diabetes mellitus without complication (Gary Craig)   . GERD (gastroesophageal reflux disease)    zantac as needed  . High cholesterol   . History of kidney stones   . HTN (hypertension)   . Hypothyroidism   . S/P CABG (coronary artery bypass graft)   . Skin cancer   . Stroke [facility]) 2012   tia    Past Surgical History:  Procedure Laterality Date  . CORONARY ARTERY BYPASS GRAFT  11/29/2004  . EAR BIOPSY     skin ca  . LITHOTRIPSY Right   . TONSILLECTOMY      Family History  Problem Relation Age of Onset  . CAD Brother   . Heart block Brother    Social History:  reports that he quit smoking about 33 years ago. His smoking use included Cigarettes, Pipe, and Cigars. He smoked 1.00 pack per day. He has never used smokeless tobacco. He reports that he drinks alcohol. He reports that he does not use drugs.  Allergies:  Allergies  Allergen Reactions  . No Known Allergies     Medications Prior to Admission  Medication Sig Dispense Refill  . atorvastatin (LIPITOR) 40 MG tablet  Take 1 tablet by mouth daily.    . clopidogrel (PLAVIX) 75 MG tablet Take 1 tablet (75 mg total) by mouth daily. 30 tablet 0  . Glucosamine-Chondroitin 500-400 MG CAPS Take 1 tablet by mouth daily.    Marland Kitchen levothyroxine (SYNTHROID, LEVOTHROID) 112 MCG tablet Take 1 tablet by mouth daily.    Marland Kitchen lisinopril (PRINIVIL,ZESTRIL) 2.5 MG tablet Take 1 tablet by mouth daily.    . metFORMIN (GLUCOPHAGE) 500 MG tablet Take 1 tablet by mouth daily.    . Multiple Vitamin (MULTIVITAMIN WITH MINERALS) TABS tablet Take 1 tablet by mouth daily.    . Omega-3 Fatty Acids (FISH OIL PO) Take 1 capsule by mouth 2 (two) times daily.    . ranitidine (ZANTAC) 75 MG tablet Take 75 mg by mouth as needed for heartburn.    . methylPREDNISolone (MEDROL DOSEPAK) 4 MG TBPK tablet Day 1: 24 mg: 8 mg before breakfast, 4 mg after lunch, 4 mg after supper, and 8 mg at bedtime  Day 2: 20 mg: 4 mg before breakfast, 4 mg after lunch, 4 mg after supper, and 8 mg at bedtime Day 3: 16 mg: 4 mg before breakfast, 4 mg after lunch, 4 mg after supper, and 4 mg at bedtime Day 4: 12 mg: 4 mg before breakfast, 4 mg after lunch, and 4 mg at bedtime Day 5: 8 mg:4 mg before breakfast and 4 mg at bedtime Day 6: 4 mg:4 mg before breakfast (  Patient not taking: Reported on 01/03/2016) 21 tablet 0    Results for orders placed or performed during the hospital encounter of 01/16/16 (from the past 48 hour(s))  Glucose, capillary     Status: Abnormal   Collection Time: 01/16/16  8:24 AM  Result Value Ref Range   Glucose-Capillary 158 (H) 65 - 99 mg/dL   No results found.  Review of Systems  Constitutional: Negative.   HENT: Negative.   Eyes: Negative.   Respiratory: Negative.   Cardiovascular: Negative.   Gastrointestinal: Negative.   Genitourinary: Negative.   Musculoskeletal: Positive for joint pain, myalgias and neck pain.  Skin: Negative.   Neurological: Negative.   Endo/Heme/Allergies: Negative.   Psychiatric/Behavioral: Negative.      Blood pressure (!) 145/52, pulse (!) Gary, temperature 97.7 F (36.5 C), temperature source Oral, resp. rate 20, weight 72.6 kg (160 lb), SpO2 100 %. Physical Exam  Constitutional: He is oriented to person, place, and time. He appears well-developed and well-nourished.  Neck: Normal range of motion.  Respiratory: Effort normal.  GI: Soft.  Neurological: He is alert and oriented to person, place, and time. He has normal strength. GCS eye subscore is 4. GCS verbal subscore is 5. GCS motor subscore is 6.  Strength is 5 of 5 in his deltoid, bicep, tricep, wrist flexion, wrist extension, and intrinsics.  Skin: Skin is warm and dry.     Assessment/Plan 83 year old Craig presents for an ACDF at C4-5.  Gary Craig P, MD 01/16/2016, 9:54 AM

## 2016-01-16 NOTE — Transfer of Care (Signed)
Immediate Anesthesia Transfer of Care Note  Patient: Gary Craig  Procedure(s) Performed: Procedure(s) with comments: Cervical Four-Five Anterior cervical decompression/diskectomy/fusion (N/A) - right side approach  Patient Location: PACU  Anesthesia Type:General  Level of Consciousness: awake and alert   Airway & Oxygen Therapy: Patient Spontanous Breathing and Patient connected to nasal cannula oxygen  Post-op Assessment: Report given to RN, Post -op Vital signs reviewed and stable and Patient moving all extremities  Post vital signs: Reviewed and stable  Last Vitals:  Vitals:   01/16/16 0809  BP: (!) 145/52  Pulse: (!) 57  Resp: 20  Temp: 36.5 C    Last Pain:  Vitals:   01/16/16 0809  TempSrc: Oral     HR 82, RR 19, Sats 100%, BP [id number]    Complications: No apparent anesthesia complications

## 2016-01-16 NOTE — Progress Notes (Signed)
Occupational Therapy Evaluation/Discharge Patient Details Name: Gary Craig MRN: [id number] DOB: [birth year] Today's Date: 01/16/2016    History of Present Illness 82 y.o. male s/p C4-5 ACDF. PMH significant for HTN, CAD, s/p CABG, carotid stenosis, CVA (2012, 2016), DM type II, GERD, skin cancer, and hypothyroidism.   Clinical Impression   Patient has been evaluated by Occupational Therapy with no acute OT needs identified. Pt completed all basic ADLs and transfers with modified independence. Educated pt on cervical precautions, brace wear protocol, compensatory strategies for LB ADLs including availability of AE, and fall prevention strategies. All education has been completed and pt has no further questions. Pt with no further acute OT needs. OT signing off. Thank you for this referral.    Follow Up Recommendations  No OT follow up;Supervision - Intermittent    Equipment Recommendations  None recommended by OT    Recommendations for Other Services       Precautions / Restrictions Precautions Precautions: Fall;Cervical Precaution Booklet Issued: Yes (comment) Precaution Comments: Reviewed cervical precautions and provided handout Required Braces or Orthoses: Cervical Brace Cervical Brace: Soft collar Restrictions Weight Bearing Restrictions: No      Mobility Bed Mobility               General bed mobility comments: Pt sitting up in visitor waiting room on OT arrival  Transfers Overall transfer level: Modified independent Equipment used: None             General transfer comment: No physical assistance required. No LOB noted or reports of dizziness.    Balance Overall balance assessment: No apparent balance deficits (not formally assessed)                                          ADL Overall ADL's : Modified independent                                       General ADL Comments: Discussed cervical precautions,  brace wear protocol, compensatory strategies for LB ADLs, fall prevention, and availability of AE for LB bathing.     Vision Vision Assessment?: No apparent visual deficits   Perception     Praxis      Pertinent Vitals/Pain Pain Assessment: 0-10 Pain Score: 2  Pain Location: neck Pain Descriptors / Indicators: Sore Pain Intervention(s): Limited activity within patient's tolerance;Monitored during session;Premedicated before session;Repositioned     Hand Dominance Right   Extremity/Trunk Assessment Upper Extremity Assessment Upper Extremity Assessment: Overall WFL for tasks assessed   Lower Extremity Assessment Lower Extremity Assessment: Overall WFL for tasks assessed   Cervical / Trunk Assessment Cervical / Trunk Assessment: Other exceptions Cervical / Trunk Exceptions: s/p cervical sx   Communication Communication Communication: No difficulties   Cognition Arousal/Alertness: Awake/alert Behavior During Therapy: WFL for tasks assessed/performed Overall Cognitive Status: Within Functional Limits for tasks assessed                     General Comments       Exercises       Shoulder Instructions      Home Living Family/patient expects to be discharged to:: Private residence Living Arrangements: Spouse/significant other Available Help at Discharge: Family;Available 24 hours/day Type of Home: House Home Access: Stairs to enter [employer] of Steps:  3 Entrance Stairs-Rails: Right Home Layout: Two level;Able to live on main level with bedroom/bathroom     Bathroom Shower/Tub: Tub/shower unit Shower/tub characteristics: Architectural technologist: Standard     Home Equipment: Hand held shower head          Prior Functioning/Environment Level of Independence: Independent        Comments: Very active, walks 2-3 miles/day, retired from [employer]    OT Diagnosis: Acute pain   OT Problem List: Pain;Decreased knowledge of  precautions   OT Treatment/Interventions:      OT Goals(Current goals can be found in the care plan section) Acute Rehab OT Goals Patient Stated Goal: to go home tonight OT Goal Formulation: With patient Time For Goal Achievement: 01/30/16 Potential to Achieve Goals: Good  OT Frequency:     Barriers to D/C:            Co-evaluation              End of Session Equipment Utilized During Treatment: Cervical collar Nurse Communication: Mobility status  Activity Tolerance: Patient tolerated treatment well Patient left: in chair;Other (comment) (in lounge area - RN aware)   Time: [phone] OT Time Calculation (min): 18 min Charges:  OT General Charges $OT Visit: 1 Procedure OT Evaluation $OT Eval Low Complexity: 1 Procedure G-Codes: OT G-codes **NOT FOR INPATIENT CLASS** Functional Assessment Tool Used: clinical judgement Functional Limitation: Self care Self Care Current Status [id number]): 0 percent impaired, limited or restricted Self Care Goal Status [id number]): 0 percent impaired, limited or restricted Self Care Discharge Status [id number]): 0 percent impaired, limited or restricted  Redmond Baseman, OTR/L Pager: [phone] 01/16/2016, 4:45 PM

## 2016-01-16 NOTE — Anesthesia Postprocedure Evaluation (Signed)
Anesthesia Post Note  Patient: Gary Craig  Procedure(s) Performed: Procedure(s) (LRB): Cervical Four-Five Anterior cervical decompression/diskectomy/fusion (N/A)  Patient location during evaluation: PACU Anesthesia Type: General Level of consciousness: awake Pain management: pain level controlled Vital Signs Assessment: post-procedure vital signs reviewed and stable Respiratory status: spontaneous breathing Cardiovascular status: stable Postop Assessment: no signs of nausea or vomiting Anesthetic complications: no     Last Vitals:  Vitals:   01/16/16 1159 01/16/16 1213  BP: (!) 170/90 (!) 172/75  Pulse: 91 65  Resp: 17 (!) 22  Temp:      Last Pain:  Vitals:   01/16/16 0809  TempSrc: Oral   Pain Goal:                 Arlind Klingerman JR,JOHN Marlette Curvin

## 2016-01-16 NOTE — Anesthesia Procedure Notes (Signed)
Procedure Name: Intubation Date/Time: 01/16/2016 10:17 AM Performed by: Merrilyn Puma B Pre-anesthesia Checklist: Patient identified, Emergency Drugs available, Suction available and Patient being monitored Patient Re-evaluated:Patient Re-evaluated prior to inductionOxygen Delivery Method: Circle system utilized Preoxygenation: Pre-oxygenation with 100% oxygen Intubation Type: IV induction Ventilation: Oral airway inserted - appropriate to patient size and Mask ventilation without difficulty Laryngoscope Size: Miller and 2 Grade View: Grade I Tube type: Oral Tube size: 7.5 mm Number of attempts: 1 Airway Equipment and Method: Stylet Placement Confirmation: ETT inserted through vocal cords under direct vision,  positive ETCO2 and breath sounds checked- equal and bilateral Secured at: 24 cm Tube secured with: Tape

## 2016-01-16 NOTE — Anesthesia Preprocedure Evaluation (Signed)
Anesthesia Evaluation  Patient identified by MRN, date of birth, ID band Patient awake    Reviewed: Allergy & Precautions, H&P , NPO status , Patient's Chart, lab work & pertinent test results  Airway Mallampati: I  TM Distance: >3 FB Neck ROM: full    Dental no notable dental hx.    Pulmonary former smoker,    Pulmonary exam normal        Cardiovascular Exercise Tolerance: Good hypertension, Pt. on medications Normal cardiovascular exam     Neuro/Psych 2016 carotid dopplers are ok negative psych ROS   GI/Hepatic Neg liver ROS, GERD  Medicated and Controlled,  Endo/Other  negative endocrine ROSdiabetes, Type 2, Oral Hypoglycemic AgentsHypothyroidism   Renal/GU negative Renal ROS  negative genitourinary   Musculoskeletal  (+) Arthritis , Osteoarthritis,    Abdominal Normal abdominal exam  (+)   Peds negative pediatric ROS (+)  Hematology negative hematology ROS (+)   Anesthesia Other Findings   Reproductive/Obstetrics negative OB ROS                             Anesthesia Physical Anesthesia Plan  ASA: II  Anesthesia Plan: General   Post-op Pain Management:    Induction: Intravenous  Airway Management Planned: Oral ETT  Additional Equipment:   Intra-op Plan:   Post-operative Plan: Extubation in OR  Informed Consent: I have reviewed the patients History and Physical, chart, labs and discussed the procedure including the risks, benefits and alternatives for the proposed anesthesia with the patient or authorized representative who has indicated his/her understanding and acceptance.   Dental advisory given  Plan Discussed with: CRNA and Surgeon  Anesthesia Plan Comments:         Anesthesia Quick Evaluation

## 2016-01-16 NOTE — Progress Notes (Signed)
Pt doing well. Pt and family given D/C instructions with Rx, verbal understanding was provided. Pt's IV was removed prior to D/C. Pt's incision is clean and dry with no sign of infection. Pt D/C'd home via walking @ 1910 per MD order. Pt is stable @ D/C and has no other needs at this time. Holli Humbles, RN

## 2016-01-19 ENCOUNTER — Encounter (HOSPITAL_COMMUNITY): Payer: Self-pay | Admitting: Neurosurgery

## 2016-02-05 ENCOUNTER — Emergency Department: Payer: Medicare Other

## 2016-02-05 ENCOUNTER — Encounter (HOSPITAL_COMMUNITY)
Admission: AD | Disposition: A | Discharge status: Rehabilitation Facility | Payer: Self-pay | Source: Other Acute Inpatient Hospital | Attending: Neurosurgery

## 2016-02-05 ENCOUNTER — Inpatient Hospital Stay (HOSPITAL_COMMUNITY): Payer: Medicare Other | Admitting: Anesthesiology

## 2016-02-05 ENCOUNTER — Encounter (HOSPITAL_COMMUNITY): Payer: Self-pay | Admitting: Certified Registered"

## 2016-02-05 ENCOUNTER — Inpatient Hospital Stay (HOSPITAL_COMMUNITY)
Admission: AD | Admit: 2016-02-05 | Discharge: 2016-02-10 | DRG: 453 | Disposition: A | Discharge status: Rehabilitation Facility | Payer: Medicare Other | Source: Other Acute Inpatient Hospital | Attending: Neurosurgery | Admitting: Neurosurgery

## 2016-02-05 ENCOUNTER — Encounter: Payer: Self-pay | Admitting: Emergency Medicine

## 2016-02-05 ENCOUNTER — Inpatient Hospital Stay (HOSPITAL_COMMUNITY): Payer: Medicare Other

## 2016-02-05 ENCOUNTER — Emergency Department
Admission: EM | Admit: 2016-02-05 | Discharge: 2016-02-05 | Payer: Medicare Other | Attending: Emergency Medicine | Admitting: Emergency Medicine

## 2016-02-05 DIAGNOSIS — Z981 Arthrodesis status: Secondary | ICD-10-CM | POA: Diagnosis not present

## 2016-02-05 DIAGNOSIS — Z8673 Personal history of transient ischemic attack (TIA), and cerebral infarction without residual deficits: Secondary | ICD-10-CM

## 2016-02-05 DIAGNOSIS — E1142 Type 2 diabetes mellitus with diabetic polyneuropathy: Secondary | ICD-10-CM | POA: Diagnosis present

## 2016-02-05 DIAGNOSIS — R1310 Dysphagia, unspecified: Secondary | ICD-10-CM

## 2016-02-05 DIAGNOSIS — R531 Weakness: Secondary | ICD-10-CM | POA: Diagnosis present

## 2016-02-05 DIAGNOSIS — I2510 Atherosclerotic heart disease of native coronary artery without angina pectoris: Secondary | ICD-10-CM | POA: Insufficient documentation

## 2016-02-05 DIAGNOSIS — Z85828 Personal history of other malignant neoplasm of skin: Secondary | ICD-10-CM | POA: Insufficient documentation

## 2016-02-05 DIAGNOSIS — M542 Cervicalgia: Secondary | ICD-10-CM | POA: Insufficient documentation

## 2016-02-05 DIAGNOSIS — Z7984 Long term (current) use of oral hypoglycemic drugs: Secondary | ICD-10-CM | POA: Diagnosis not present

## 2016-02-05 DIAGNOSIS — Z419 Encounter for procedure for purposes other than remedying health state, unspecified: Secondary | ICD-10-CM

## 2016-02-05 DIAGNOSIS — E1151 Type 2 diabetes mellitus with diabetic peripheral angiopathy without gangrene: Secondary | ICD-10-CM | POA: Diagnosis present

## 2016-02-05 DIAGNOSIS — Z7952 Long term (current) use of systemic steroids: Secondary | ICD-10-CM | POA: Diagnosis not present

## 2016-02-05 DIAGNOSIS — N390 Urinary tract infection, site not specified: Secondary | ICD-10-CM | POA: Diagnosis not present

## 2016-02-05 DIAGNOSIS — D72829 Elevated white blood cell count, unspecified: Secondary | ICD-10-CM

## 2016-02-05 DIAGNOSIS — E119 Type 2 diabetes mellitus without complications: Secondary | ICD-10-CM

## 2016-02-05 DIAGNOSIS — G8250 Quadriplegia, unspecified: Secondary | ICD-10-CM | POA: Diagnosis present

## 2016-02-05 DIAGNOSIS — M4712 Other spondylosis with myelopathy, cervical region: Secondary | ICD-10-CM

## 2016-02-05 DIAGNOSIS — R29898 Other symptoms and signs involving the musculoskeletal system: Secondary | ICD-10-CM

## 2016-02-05 DIAGNOSIS — Y838 Other surgical procedures as the cause of abnormal reaction of the patient, or of later complication, without mention of misadventure at the time of the procedure: Secondary | ICD-10-CM | POA: Diagnosis present

## 2016-02-05 DIAGNOSIS — M79661 Pain in right lower leg: Secondary | ICD-10-CM | POA: Diagnosis not present

## 2016-02-05 DIAGNOSIS — Z87891 Personal history of nicotine dependence: Secondary | ICD-10-CM | POA: Diagnosis not present

## 2016-02-05 DIAGNOSIS — E7800 Pure hypercholesterolemia, unspecified: Secondary | ICD-10-CM | POA: Diagnosis present

## 2016-02-05 DIAGNOSIS — R200 Anesthesia of skin: Secondary | ICD-10-CM | POA: Diagnosis present

## 2016-02-05 DIAGNOSIS — E785 Hyperlipidemia, unspecified: Secondary | ICD-10-CM | POA: Diagnosis present

## 2016-02-05 DIAGNOSIS — M549 Dorsalgia, unspecified: Secondary | ICD-10-CM | POA: Insufficient documentation

## 2016-02-05 DIAGNOSIS — I10 Essential (primary) hypertension: Secondary | ICD-10-CM | POA: Diagnosis not present

## 2016-02-05 DIAGNOSIS — Y793 Surgical instruments, materials and orthopedic devices (including sutures) associated with adverse incidents: Secondary | ICD-10-CM | POA: Diagnosis present

## 2016-02-05 DIAGNOSIS — Z79899 Other long term (current) drug therapy: Secondary | ICD-10-CM | POA: Insufficient documentation

## 2016-02-05 DIAGNOSIS — E039 Hypothyroidism, unspecified: Secondary | ICD-10-CM | POA: Insufficient documentation

## 2016-02-05 DIAGNOSIS — T8482XA Fibrosis due to internal orthopedic prosthetic devices, implants and grafts, initial encounter: Secondary | ICD-10-CM | POA: Diagnosis present

## 2016-02-05 DIAGNOSIS — Z23 Encounter for immunization: Secondary | ICD-10-CM | POA: Diagnosis not present

## 2016-02-05 DIAGNOSIS — M4802 Spinal stenosis, cervical region: Secondary | ICD-10-CM | POA: Diagnosis present

## 2016-02-05 DIAGNOSIS — S064X0A Epidural hemorrhage without loss of consciousness, initial encounter: Secondary | ICD-10-CM | POA: Diagnosis present

## 2016-02-05 DIAGNOSIS — T84216A Breakdown (mechanical) of internal fixation device of vertebrae, initial encounter: Principal | ICD-10-CM | POA: Diagnosis present

## 2016-02-05 DIAGNOSIS — M79662 Pain in left lower leg: Secondary | ICD-10-CM | POA: Diagnosis not present

## 2016-02-05 DIAGNOSIS — Z951 Presence of aortocoronary bypass graft: Secondary | ICD-10-CM

## 2016-02-05 DIAGNOSIS — K219 Gastro-esophageal reflux disease without esophagitis: Secondary | ICD-10-CM | POA: Diagnosis present

## 2016-02-05 DIAGNOSIS — G959 Disease of spinal cord, unspecified: Secondary | ICD-10-CM | POA: Diagnosis not present

## 2016-02-05 DIAGNOSIS — Z8249 Family history of ischemic heart disease and other diseases of the circulatory system: Secondary | ICD-10-CM | POA: Diagnosis not present

## 2016-02-05 DIAGNOSIS — Z7902 Long term (current) use of antithrombotics/antiplatelets: Secondary | ICD-10-CM

## 2016-02-05 DIAGNOSIS — B9689 Other specified bacterial agents as the cause of diseases classified elsewhere: Secondary | ICD-10-CM | POA: Diagnosis not present

## 2016-02-05 DIAGNOSIS — R202 Paresthesia of skin: Secondary | ICD-10-CM | POA: Diagnosis not present

## 2016-02-05 DIAGNOSIS — Z418 Encounter for other procedures for purposes other than remedying health state: Secondary | ICD-10-CM

## 2016-02-05 DIAGNOSIS — R4587 Impulsiveness: Secondary | ICD-10-CM | POA: Diagnosis not present

## 2016-02-05 DIAGNOSIS — G8918 Other acute postprocedural pain: Secondary | ICD-10-CM

## 2016-02-05 DIAGNOSIS — R0682 Tachypnea, not elsewhere classified: Secondary | ICD-10-CM

## 2016-02-05 HISTORY — PX: CERVICAL WOUND DEBRIDEMENT: SHX6694

## 2016-02-05 LAB — CBC WITH DIFFERENTIAL/PLATELET
BASOS ABS: 0 10*3/uL (ref 0–0.1)
BASOS PCT: 0 %
EOS PCT: 1 %
Eosinophils Absolute: 0.1 10*3/uL (ref 0–0.7)
HEMATOCRIT: 41.4 % (ref 40.0–52.0)
HEMOGLOBIN: 14.2 g/dL (ref 13.0–18.0)
Lymphocytes Relative: 12 %
Lymphs Abs: 1.5 10*3/uL (ref 1.0–3.6)
MCH: 31 pg (ref 26.0–34.0)
MCHC: 34.3 g/dL (ref 32.0–36.0)
MCV: 90.2 fL (ref 80.0–100.0)
MONO ABS: 0.5 10*3/uL (ref 0.2–1.0)
Monocytes Relative: 4 %
Neutro Abs: 10.6 10*3/uL — ABNORMAL HIGH (ref 1.4–6.5)
Neutrophils Relative %: 83 %
Platelets: 287 10*3/uL (ref 150–440)
RBC: 4.58 MIL/uL (ref 4.40–5.90)
RDW: 13 % (ref 11.5–14.5)
WBC: 12.8 10*3/uL — AB (ref 3.8–10.6)

## 2016-02-05 LAB — COMPREHENSIVE METABOLIC PANEL
ALBUMIN: 4.7 g/dL (ref 3.5–5.0)
ALK PHOS: 163 U/L — AB (ref 38–126)
ALT: 23 U/L (ref 17–63)
AST: 20 U/L (ref 15–41)
Anion gap: 9 (ref 5–15)
BILIRUBIN TOTAL: 0.7 mg/dL (ref 0.3–1.2)
BUN: 18 mg/dL (ref 6–20)
CALCIUM: 9.9 mg/dL (ref 8.9–10.3)
CO2: 28 mmol/L (ref 22–32)
Chloride: 101 mmol/L (ref 101–111)
Creatinine, Ser: 0.7 mg/dL (ref 0.61–1.24)
GFR calc Af Amer: >60 mL/min (ref >60)
GFR calc non Af Amer: >60 mL/min (ref >60)
GLUCOSE: 171 mg/dL — AB (ref 65–99)
Potassium: 4.3 mmol/L (ref 3.5–5.1)
Sodium: 138 mmol/L (ref 135–145)
TOTAL PROTEIN: 7.7 g/dL (ref 6.5–8.1)

## 2016-02-05 LAB — GLUCOSE, CAPILLARY
GLUCOSE-CAPILLARY: 199 mg/dL — AB (ref 65–99)
Glucose-Capillary: 221 mg/dL — ABNORMAL HIGH (ref 65–99)
Glucose-Capillary: 215 mg/dL — ABNORMAL HIGH (ref 65–99)

## 2016-02-05 LAB — SEDIMENTATION RATE: Sed Rate: 18 mm/hr (ref 0–20)

## 2016-02-05 SURGERY — CERVICAL WOUND DEBRIDEMENT
Anesthesia: General | Site: Neck

## 2016-02-05 MED ORDER — THROMBIN 5000 UNITS EX SOLR
OROMUCOSAL | Status: DC | PRN
  Administered 2016-02-05 (×3): 5 mL via TOPICAL

## 2016-02-05 MED ORDER — DEXAMETHASONE SODIUM PHOSPHATE 10 MG/ML IJ SOLN
INTRAMUSCULAR | Status: DC | PRN
  Administered 2016-02-05: 20 mg via INTRAVENOUS

## 2016-02-05 MED ORDER — SODIUM CHLORIDE 0.9% FLUSH
3.0000 mL | Freq: Two times a day (BID) | INTRAVENOUS | Status: DC
  Administered 2016-02-06 – 2016-02-09 (×6): 3 mL via INTRAVENOUS

## 2016-02-05 MED ORDER — PROPOFOL 10 MG/ML IV BOLUS
INTRAVENOUS | Status: DC | PRN
Start: 2016-02-05 — End: 2016-02-05
  Administered 2016-02-05: 130 mg via INTRAVENOUS

## 2016-02-05 MED ORDER — THROMBIN 20000 UNITS EX SOLR
CUTANEOUS | Status: DC | PRN
  Administered 2016-02-05: 20 mL via TOPICAL

## 2016-02-05 MED ORDER — SODIUM CHLORIDE 0.9 % IV SOLN
INTRAVENOUS | Status: DC | PRN
  Administered 2016-02-05 (×2): via INTRAVENOUS

## 2016-02-05 MED ORDER — ATORVASTATIN CALCIUM 40 MG PO TABS
40.0000 mg | ORAL_TABLET | Freq: Every day | ORAL | Status: DC
  Administered 2016-02-05 – 2016-02-09 (×5): 40 mg via ORAL
  Filled 2016-02-05 (×5): qty 1

## 2016-02-05 MED ORDER — LISINOPRIL 2.5 MG PO TABS
2.5000 mg | ORAL_TABLET | ORAL | Status: DC
  Administered 2016-02-07 – 2016-02-10 (×3): 2.5 mg via ORAL
  Filled 2016-02-05 (×4): qty 1

## 2016-02-05 MED ORDER — SODIUM CHLORIDE 0.9 % IV SOLN: 250.0000 mL | INTRAVENOUS | Status: DC

## 2016-02-05 MED ORDER — MORPHINE SULFATE (PF) 2 MG/ML IV SOLN
1.0000 mg | INTRAVENOUS | Status: DC | PRN
  Administered 2016-02-09: 2 mg via INTRAVENOUS
  Filled 2016-02-05: qty 1

## 2016-02-05 MED ORDER — LIDOCAINE 2% (20 MG/ML) 5 ML SYRINGE
INTRAMUSCULAR | Status: AC
  Filled 2016-02-05: qty 5

## 2016-02-05 MED ORDER — LEVOTHYROXINE SODIUM 112 MCG PO TABS
112.0000 ug | ORAL_TABLET | Freq: Every day | ORAL | Status: DC
  Administered 2016-02-06 – 2016-02-10 (×5): 112 ug via ORAL
  Filled 2016-02-05 (×5): qty 1

## 2016-02-05 MED ORDER — ONDANSETRON HCL 4 MG/2ML IJ SOLN
INTRAMUSCULAR | Status: AC
  Filled 2016-02-05: qty 2

## 2016-02-05 MED ORDER — SUCCINYLCHOLINE CHLORIDE 200 MG/10ML IV SOSY
PREFILLED_SYRINGE | INTRAVENOUS | Status: DC | PRN
  Administered 2016-02-05: 100 mg via INTRAVENOUS

## 2016-02-05 MED ORDER — SUCCINYLCHOLINE CHLORIDE 200 MG/10ML IV SOSY
PREFILLED_SYRINGE | INTRAVENOUS | Status: AC
  Filled 2016-02-05: qty 10

## 2016-02-05 MED ORDER — INSULIN ASPART 100 UNIT/ML ~~LOC~~ SOLN
0.0000 [IU] | Freq: Three times a day (TID) | SUBCUTANEOUS | Status: DC
  Administered 2016-02-06 (×2): 3 [IU] via SUBCUTANEOUS
  Administered 2016-02-07: 1 [IU] via SUBCUTANEOUS
  Administered 2016-02-07 – 2016-02-10 (×9): 2 [IU] via SUBCUTANEOUS

## 2016-02-05 MED ORDER — ACETAMINOPHEN 650 MG RE SUPP: 650.0000 mg | RECTAL | Status: DC | PRN

## 2016-02-05 MED ORDER — PHENOL 1.4 % MT LIQD
1.0000 | OROMUCOSAL | Status: DC | PRN
  Filled 2016-02-05: qty 177

## 2016-02-05 MED ORDER — BISACODYL 10 MG RE SUPP
10.0000 mg | Freq: Every day | RECTAL | Status: DC | PRN
  Administered 2016-02-07: 10 mg via RECTAL
  Filled 2016-02-05: qty 1

## 2016-02-05 MED ORDER — DIAZEPAM 5 MG PO TABS
5.0000 mg | ORAL_TABLET | Freq: Once | ORAL | Status: AC
  Administered 2016-02-05: 5 mg via ORAL
  Filled 2016-02-05: qty 1

## 2016-02-05 MED ORDER — ACETAMINOPHEN 325 MG PO TABS: 650.0000 mg | ORAL_TABLET | ORAL | Status: DC | PRN

## 2016-02-05 MED ORDER — CEFAZOLIN IN D5W 1 GM/50ML IV SOLN
1.0000 g | Freq: Three times a day (TID) | INTRAVENOUS | Status: AC
  Administered 2016-02-06 (×2): 1 g via INTRAVENOUS
  Filled 2016-02-05 (×2): qty 50

## 2016-02-05 MED ORDER — LIDOCAINE 2% (20 MG/ML) 5 ML SYRINGE
INTRAMUSCULAR | Status: DC | PRN
  Administered 2016-02-05: 60 mg via INTRAVENOUS

## 2016-02-05 MED ORDER — CEFAZOLIN SODIUM-DEXTROSE 2-4 GM/100ML-% IV SOLN
INTRAVENOUS | Status: AC
  Filled 2016-02-05: qty 100

## 2016-02-05 MED ORDER — METHYLPREDNISOLONE SODIUM SUCC 1000 MG IJ SOLR
250.0000 mg | Freq: Once | INTRAMUSCULAR | Status: AC
  Administered 2016-02-05: 250 mg via INTRAVENOUS
  Filled 2016-02-05: qty 2

## 2016-02-05 MED ORDER — METFORMIN HCL ER 500 MG PO TB24
500.0000 mg | ORAL_TABLET | Freq: Every day | ORAL | Status: DC
  Administered 2016-02-06 – 2016-02-10 (×4): 500 mg via ORAL
  Filled 2016-02-05 (×4): qty 1

## 2016-02-05 MED ORDER — CEFAZOLIN SODIUM-DEXTROSE 2-3 GM-% IV SOLR
INTRAVENOUS | Status: DC | PRN
  Administered 2016-02-05: 2 g via INTRAVENOUS

## 2016-02-05 MED ORDER — DEXAMETHASONE SODIUM PHOSPHATE 4 MG/ML IJ SOLN
4.0000 mg | Freq: Four times a day (QID) | INTRAMUSCULAR | Status: DC
  Administered 2016-02-05 – 2016-02-10 (×14): 4 mg via INTRAVENOUS
  Filled 2016-02-05 (×14): qty 1

## 2016-02-05 MED ORDER — FENTANYL CITRATE (PF) 100 MCG/2ML IJ SOLN
INTRAMUSCULAR | Status: DC | PRN
  Administered 2016-02-05 (×2): 100 ug via INTRAVENOUS

## 2016-02-05 MED ORDER — DEXAMETHASONE 4 MG PO TABS
4.0000 mg | ORAL_TABLET | Freq: Four times a day (QID) | ORAL | Status: DC
  Administered 2016-02-07 (×3): 4 mg via ORAL
  Filled 2016-02-05 (×3): qty 1

## 2016-02-05 MED ORDER — FENTANYL CITRATE (PF) 100 MCG/2ML IJ SOLN
INTRAMUSCULAR | Status: AC
  Filled 2016-02-05: qty 2

## 2016-02-05 MED ORDER — ONDANSETRON HCL 4 MG/2ML IJ SOLN
4.0000 mg | INTRAMUSCULAR | Status: DC | PRN
  Administered 2016-02-08: 4 mg via INTRAVENOUS

## 2016-02-05 MED ORDER — PROPOFOL 10 MG/ML IV BOLUS
INTRAVENOUS | Status: AC
  Filled 2016-02-05: qty 20

## 2016-02-05 MED ORDER — SODIUM CHLORIDE 0.9 % IV SOLN
INTRAVENOUS | Status: DC
  Administered 2016-02-05 – 2016-02-08 (×4): via INTRAVENOUS

## 2016-02-05 MED ORDER — SODIUM CHLORIDE 0.9% FLUSH: 3.0000 mL | INTRAVENOUS | Status: DC | PRN

## 2016-02-05 MED ORDER — GADOBENATE DIMEGLUMINE 529 MG/ML IV SOLN
15.0000 mL | Freq: Once | INTRAVENOUS | Status: AC | PRN
  Administered 2016-02-05: 15 mL via INTRAVENOUS

## 2016-02-05 MED ORDER — INSULIN ASPART 100 UNIT/ML ~~LOC~~ SOLN
0.0000 [IU] | Freq: Every day | SUBCUTANEOUS | Status: DC
  Administered 2016-02-05 – 2016-02-08 (×3): 2 [IU] via SUBCUTANEOUS

## 2016-02-05 MED ORDER — 0.9 % SODIUM CHLORIDE (POUR BTL) OPTIME
TOPICAL | Status: DC | PRN
  Administered 2016-02-05: 1000 mL

## 2016-02-05 MED ORDER — DEXAMETHASONE SODIUM PHOSPHATE 10 MG/ML IJ SOLN
INTRAMUSCULAR | Status: AC
  Filled 2016-02-05: qty 2

## 2016-02-05 MED ORDER — FENTANYL CITRATE (PF) 100 MCG/2ML IJ SOLN
25.0000 ug | INTRAMUSCULAR | Status: DC | PRN
  Administered 2016-02-05: 50 ug via INTRAVENOUS

## 2016-02-05 MED ORDER — PROMETHAZINE HCL 25 MG/ML IJ SOLN: 6.2500 mg | INTRAMUSCULAR | Status: DC | PRN

## 2016-02-05 MED ORDER — MENTHOL 3 MG MT LOZG: 1.0000 | LOZENGE | OROMUCOSAL | Status: DC | PRN

## 2016-02-05 SURGICAL SUPPLY — 30 items
BIT DRILL SPINE QC 14 (BIT) ×2 IMPLANT
CANISTER SUCT 3000ML PPV (MISCELLANEOUS) ×2 IMPLANT
CLOTH BEACON ORANGE TIMEOUT ST (SAFETY) ×2 IMPLANT
CONT SPEC 4OZ CLIKSEAL STRL BL (MISCELLANEOUS) ×2 IMPLANT
CORDS BIPOLAR (ELECTRODE) ×2 IMPLANT
COVER MAYO STAND STRL (DRAPES) ×2 IMPLANT
DRAPE LAPAROTOMY 100X72 PEDS (DRAPES) ×2 IMPLANT
DRAPE MICROSCOPE LEICA (MISCELLANEOUS) ×2 IMPLANT
GAUZE SPONGE 4X4 12PLY STRL (GAUZE/BANDAGES/DRESSINGS) ×2 IMPLANT
GLOVE BIO SURGEON STRL SZ 6.5 (GLOVE) ×2 IMPLANT
GLOVE BIO SURGEON STRL SZ7 (GLOVE) ×2 IMPLANT
GLOVE BIOGEL M 8.0 STRL (GLOVE) ×2 IMPLANT
GOWN STRL REUS W/ TWL LRG LVL3 (GOWN DISPOSABLE) ×2 IMPLANT
GOWN STRL REUS W/TWL LRG LVL3 (GOWN DISPOSABLE) ×2
KIT BASIN OR (CUSTOM PROCEDURE TRAY) ×2 IMPLANT
KIT ROOM TURNOVER OR (KITS) ×2 IMPLANT
PACK LAMINECTOMY NEURO (CUSTOM PROCEDURE TRAY) ×2 IMPLANT
PAD ARMBOARD 7.5X6 YLW CONV (MISCELLANEOUS) ×6 IMPLANT
PLATE ANT CERV XTEND 1 LV 20 (Plate) ×2 IMPLANT
RUBBERBAND STERILE (MISCELLANEOUS) ×4 IMPLANT
SCREW XTD VAR 4.2 SELF TAP (Screw) ×4 IMPLANT
SCREW XTEND SELFTAP VAR 4.6X14 (Screw) ×4 IMPLANT
SPACER CERV FRGE 12X14X9-0 (Spacer) ×2 IMPLANT
SPONGE INTESTINAL PEANUT (DISPOSABLE) ×4 IMPLANT
SUT VIC AB 3-0 PS2 18 (SUTURE) ×1
SUT VIC AB 3-0 PS2 18XBRD (SUTURE) ×1 IMPLANT
TAPE CLOTH SURG 4X10 WHT LF (GAUZE/BANDAGES/DRESSINGS) ×2 IMPLANT
TOWEL OR 17X24 6PK STRL BLUE (TOWEL DISPOSABLE) ×2 IMPLANT
TOWEL OR 17X26 10 PK STRL BLUE (TOWEL DISPOSABLE) ×2 IMPLANT
TRAY FOLEY CATH SILVER 16FR LF (SET/KITS/TRAYS/PACK) ×2 IMPLANT

## 2016-02-05 NOTE — Anesthesia Procedure Notes (Signed)
Procedure Name: Intubation Date/Time: 02/05/2016 6:24 PM Performed by: Marinda Elk A Pre-anesthesia Checklist: Patient identified, Emergency Drugs available, Suction available and Patient being monitored Patient Re-evaluated:Patient Re-evaluated prior to inductionOxygen Delivery Method: Circle System Utilized and Circle system utilized Preoxygenation: Pre-oxygenation with 100% oxygen Intubation Type: IV induction Ventilation: Mask ventilation without difficulty Laryngoscope Size: Glidescope Grade View: Grade I Tube type: Oral Tube size: 7.5 mm Number of attempts: 1 Airway Equipment and Method: Stylet and Video-laryngoscopy Placement Confirmation: ETT inserted through vocal cords under direct vision,  positive ETCO2 and breath sounds checked- equal and bilateral Secured at: 21 cm Tube secured with: Tape Dental Injury: Teeth and Oropharynx as per pre-operative assessment

## 2016-02-05 NOTE — ED Notes (Signed)
Called MRI Tech [phone]

## 2016-02-05 NOTE — Transfer of Care (Signed)
Immediate Anesthesia Transfer of Care Note  Patient: Gary Craig  Procedure(s) Performed: Procedure(s) with comments: Exploration of Cervical four - five wound. Removal of cervical four - five instrumentation. Insertion of new cervical four - five instrumentation. (N/A) - Exploration of Cervical four - five wound. Removal of cervical four - five instrumentation. Insertion of new cervical four - five instrumentation.   Patient Location: PACU  Anesthesia Type:General  Level of Consciousness: awake, alert  and oriented  Airway & Oxygen Therapy: Patient Spontanous Breathing  Post-op Assessment: Report given to RN and Post -op Vital signs reviewed and stable  Post vital signs: Reviewed and stable  Last Vitals: There were no vitals filed for this visit.  Last Pain:  Vitals:   02/05/16 1600  PainSc: 0-No pain         Complications: No apparent anesthesia complications

## 2016-02-05 NOTE — ED Notes (Signed)
Accepted to [facility] per Dr. Annette Stable, waiting for bed assignment [id number]

## 2016-02-05 NOTE — Anesthesia Preprocedure Evaluation (Signed)
Anesthesia Evaluation  Patient identified by MRN, date of birth, ID band Patient awake    Reviewed: Allergy & Precautions, H&P , NPO status , Patient's Chart, lab work & pertinent test results  Airway Mallampati: I  TM Distance: >3 FB Neck ROM: full    Dental no notable dental hx.    Pulmonary former smoker,    Pulmonary exam normal        Cardiovascular Exercise Tolerance: Good hypertension, Pt. on medications + CAD, + CABG and + Peripheral Vascular Disease  Normal cardiovascular exam     Neuro/Psych 2016 carotid dopplers are ok CVA negative psych ROS   GI/Hepatic Neg liver ROS, GERD  Medicated and Controlled,  Endo/Other  negative endocrine ROSdiabetes, Type 2, Oral Hypoglycemic AgentsHypothyroidism   Renal/GU negative Renal ROS  negative genitourinary   Musculoskeletal  (+) Arthritis , Osteoarthritis,    Abdominal Normal abdominal exam  (+)   Peds negative pediatric ROS (+)  Hematology negative hematology ROS (+)   Anesthesia Other Findings   Reproductive/Obstetrics negative OB ROS                             Anesthesia Physical  Anesthesia Plan  ASA: III and emergent  Anesthesia Plan: General   Post-op Pain Management:    Induction: Intravenous and Rapid sequence  Airway Management Planned: Oral ETT  Additional Equipment:   Intra-op Plan:   Post-operative Plan: Extubation in OR  Informed Consent: I have reviewed the patients History and Physical, chart, labs and discussed the procedure including the risks, benefits and alternatives for the proposed anesthesia with the patient or authorized representative who has indicated his/her understanding and acceptance.   Dental advisory given  Plan Discussed with: CRNA and Surgeon  Anesthesia Plan Comments:         Anesthesia Quick Evaluation

## 2016-02-05 NOTE — Progress Notes (Signed)
Called Dr. [name] regarding coughing after swallowing water and pt report of difficulty swallowing. Orders received.

## 2016-02-05 NOTE — ED Notes (Signed)
Patient brought back from MRI without any issues.

## 2016-02-05 NOTE — ED Provider Notes (Signed)
[facility] Emergency Department Provider Note        Time seen: ----------------------------------------- 7:13 AM on 02/05/2016 -----------------------------------------    I have reviewed the triage vital signs and the nursing notes.   HISTORY  Chief Complaint Neck Pain and Back Pain    HPI Gary Craig is a 82 y.o. male who presents to the ER forupper extremity numbness and particularly right arm weakness. Patient has had difficulty with right hand motor function over the past several days. Patient states he had surgery July 31 for spinal stenosis. Patient had a anterior spinal fusion done at that time for similar symptoms. Patient states that about a week ago. He developed shooting pain from his neck down his back and into his legs. He was also having numbness at that time, was placed on prednisone and the symptoms seemed to improve. Patient states he took his last dose yesterday and the pain and numbness is coming back. He has had particular weakness in the right hand and lack of coordination in the right hand.   Past Medical History:  Diagnosis Date  . CAD (coronary artery disease)   . Carotid stenosis   . Diabetes mellitus without complication ([place])   . GERD (gastroesophageal reflux disease)    zantac as needed  . High cholesterol   . History of kidney stones   . Hypothyroidism   . S/P CABG (coronary artery bypass graft)   . Skin cancer   . Stroke [facility]) 2012   tia    Patient Active Problem List   Diagnosis Date Noted  . Myelopathy, spondylogenic, cervical 01/16/2016  . CVA (cerebral infarction) 05/31/2015    Past Surgical History:  Procedure Laterality Date  . ANTERIOR CERVICAL DECOMP/DISCECTOMY FUSION N/A 01/16/2016   Procedure: Cervical Four-Five Anterior cervical decompression/diskectomy/fusion;  Surgeon: Kary Kos, MD;  Location: [facility];  Service: Neurosurgery;  Laterality: N/A;  right side approach  . CORONARY ARTERY BYPASS  GRAFT  11/29/2004  . EAR BIOPSY     skin ca  . LITHOTRIPSY Right   . TONSILLECTOMY      Allergies No known allergies  Social History Social History  Substance Use Topics  . Smoking status: Former Smoker    Packs/day: 1.00    Types: Cigarettes, Pipe, Cigars    Quit date: 01/09/1983  . Smokeless tobacco: Never Used  . Alcohol use 0.0 oz/week     Comment: beer occ    Review of Systems Constitutional: Negative for fever. Cardiovascular: Negative for chest pain. Respiratory: Negative for shortness of breath. Gastrointestinal: Negative for abdominal pain, vomiting and diarrhea. Genitourinary: Negative for dysuria. Musculoskeletal: Positive for right arm weakness Skin: Negative for rash. Neurological: Bilateral arm and leg paresthesias, worse on the right arm, right hand intrinsic motor function weakness  10-point ROS otherwise negative.  ____________________________________________   PHYSICAL EXAM:  VITAL SIGNS: ED Triage Vitals  Enc Vitals Group     BP 02/05/16 0455 93/68     Pulse Rate 02/05/16 0455 68     Resp 02/05/16 0455 18     Temp 02/05/16 0455 97.5 F (36.4 C)     Temp Source 02/05/16 0455 Oral     SpO2 02/05/16 0455 100 %     Weight 02/05/16 0456 160 lb (72.6 kg)     Height 02/05/16 0456 5\' 8"  (1.727 m)     Head Circumference --      Peak Flow --      Pain Score 02/05/16 0456 10  Pain Loc --      Pain Edu? --      Excl. in [place]? --     Constitutional: Alert and oriented. Well appearing and in no distress. Eyes: Conjunctivae are normal. PERRL. Normal extraocular movements. ENT   Head: Normocephalic and atraumatic.   Nose: No congestion/rhinnorhea.   Mouth/Throat: Mucous membranes are moist.   Neck: No stridor.Surgical sites appear unremarkable Cardiovascular: Normal rate, regular rhythm. No murmurs, rubs, or gallops. Respiratory: Normal respiratory effort without tachypnea nor retractions. Breath sounds are clear and equal bilaterally.  No wheezes/rales/rhonchi. Gastrointestinal: Soft and nontender. Normal bowel sounds Musculoskeletal: Nontender with normal range of motion in all extremities. No lower extremity tenderness nor edema. Neurologic:  Normal speech and language. Patient has pronounced intrinsic right hand motor weakness and lack of coordination. Strength appears to be diminished in the right hand. There are paresthesias in both arms, worse than the legs. Motor function in the legs appear to be normal. Skin:  Skin is warm, dry and intact. No rash noted. Psychiatric: Mood and affect are normal. Speech and behavior are normal.  ____________________________________________  ED COURSE:  Pertinent labs & imaging results that were available during my care of the patient were reviewed by me and considered in my medical decision making (see chart for details). Clinical Course   Patient presents with weakness and paresthesias status post cervical fusion. Patient will need MRI and neurosurgery consultation. We will also give IV steroids.  Procedures ____________________________________________   LABS (pertinent positives/negatives)  Labs Reviewed  CBC WITH DIFFERENTIAL/PLATELET - Abnormal; Notable for the following:       Result Value   WBC 12.8 (*)    Neutro Abs 10.6 (*)    All other components within normal limits  COMPREHENSIVE METABOLIC PANEL - Abnormal; Notable for the following:    Glucose, Bld 171 (*)    Alkaline Phosphatase 163 (*)    All other components within normal limits    RADIOLOGY Images were viewed by me  MRI cervical spine. IMPRESSION: Abnormal fluid collection projecting dorsally from the C4-5 interspace, status post C4-5 ACDF on 01/16/2016. Mild peripheral enhancement. Mild cord flattening and effacement of both C5 nerve roots. No definite abnormal cord signal. Postoperative seroma is favored. Abscess not excluded.  Mild edema in the C4 and C5 vertebral bodies, not  necessarily unexpected given recent surgery.  Consider noncontrast CT of the cervical spine to assess for hardware integrity and/or cage subsidence. ____________________________________________  FINAL ASSESSMENT AND PLAN  Right hand weakness, paresthesias  Plan: Patient with labs and imaging as dictated above. Patient with concerning findings on MRI as dictated above. I will discuss with neurosurgery on call for [facility].  Patient discussed with neurosurgery at [facility], has been accepted in transfer due to worsening neurologic symptoms. At this point antibiotics were not advised. Earleen Newport, MD   Note: This dictation was prepared with Dragon dictation. Any transcriptional errors that result from this process are unintentional    Earleen Newport, MD 02/05/16 1207

## 2016-02-05 NOTE — ED Notes (Signed)
Pt reports surgery for spinal stenosis end of July, since then has had "nerve pain", numbness/tingling in legs to the point he is having difficulty walking.  Pt was on tapered steroid pack and first few days sx were relieved, by end of pack, symptoms had returned.  Pt NAD at this time.

## 2016-02-05 NOTE — ED Notes (Signed)
Quinn Plowman, Karen Chafe, for neurosurgery

## 2016-02-05 NOTE — ED Notes (Signed)
Called EMS transport [phone]

## 2016-02-05 NOTE — H&P (Signed)
Gary Craig is an 82 y.o. male.   Chief Complaint: weakness HPI: patient who underwent adcf at 4-5 3 weeks ago . Was discharged and later on developed some pain and numbness. Oral steroids did help but last night he started to feel weak and went to [facility] er this am and transferred to [place] about one hour ago. He told me that since this am he is getting worse unable to move both arma and right leg. Also numbness all over his body. No able to stand on his own  Past Medical History:  Diagnosis Date  . CAD (coronary artery disease)   . Carotid stenosis   . Diabetes mellitus without complication ([place])   . GERD (gastroesophageal reflux disease)    zantac as needed  . High cholesterol   . History of kidney stones   . Hypothyroidism   . S/P CABG (coronary artery bypass graft)   . Skin cancer   . Stroke [facility]) 2012   tia    Past Surgical History:  Procedure Laterality Date  . ANTERIOR CERVICAL DECOMP/DISCECTOMY FUSION N/A 01/16/2016   Procedure: Cervical Four-Five Anterior cervical decompression/diskectomy/fusion;  Surgeon: Kary Kos, MD;  Location: [facility];  Service: Neurosurgery;  Laterality: N/A;  right side approach  . CORONARY ARTERY BYPASS GRAFT  11/29/2004  . EAR BIOPSY     skin ca  . LITHOTRIPSY Right   . TONSILLECTOMY      Family History  Problem Relation Age of Onset  . CAD Brother   . Heart block Brother    Social History:  reports that he quit smoking about 33 years ago. His smoking use included Cigarettes, Pipe, and Cigars. He smoked 1.00 pack per day. He has never used smokeless tobacco. He reports that he drinks alcohol. He reports that he does not use drugs.  Allergies:  Allergies  Allergen Reactions  . No Known Allergies     Medications Prior to Admission  Medication Sig Dispense Refill  . atorvastatin (LIPITOR) 40 MG tablet Take 1 tablet by mouth at bedtime.     . clopidogrel (PLAVIX) 75 MG tablet Take 1 tablet (75 mg total) by mouth daily.  (Patient taking differently: Take 75 mg by mouth every morning. ) 30 tablet 0  . Glucosamine-Chondroitin 500-400 MG CAPS Take 1 tablet by mouth every morning.     Marland Kitchen levothyroxine (SYNTHROID, LEVOTHROID) 112 MCG tablet Take 112 mcg by mouth daily before breakfast.     . lisinopril (PRINIVIL,ZESTRIL) 2.5 MG tablet Take 2.5 mg by mouth every morning.     . metFORMIN (GLUCOPHAGE-XR) 500 MG 24 hr tablet Take 500 mg by mouth daily with breakfast.    . methylPREDNISolone (MEDROL DOSEPAK) 4 MG TBPK tablet Day 1: 24 mg: 8 mg before breakfast, 4 mg after lunch, 4 mg after supper, and 8 mg at bedtime  Day 2: 20 mg: 4 mg before breakfast, 4 mg after lunch, 4 mg after supper, and 8 mg at bedtime Day 3: 16 mg: 4 mg before breakfast, 4 mg after lunch, 4 mg after supper, and 4 mg at bedtime Day 4: 12 mg: 4 mg before breakfast, 4 mg after lunch, and 4 mg at bedtime Day 5: 8 mg:4 mg before breakfast and 4 mg at bedtime Day 6: 4 mg:4 mg before breakfast (Patient not taking: Reported on 01/03/2016) 21 tablet 0  . Multiple Vitamin (MULTIVITAMIN WITH MINERALS) TABS tablet Take 1 tablet by mouth every morning.     Marland Kitchen omega-3 acid  ethyl esters (LOVAZA) 1 g capsule Take 1 g by mouth 2 (two) times daily.    Marland Kitchen oxyCODONE-acetaminophen (PERCOCET/ROXICET) 5-325 MG tablet Take 1 tablet by mouth every 6 (six) hours as needed for pain.    . ranitidine (ZANTAC) 75 MG tablet Take 75 mg by mouth daily as needed for heartburn.       Results for orders placed or performed during the hospital encounter of 02/05/16 (from the past 48 hour(s))  CBC with Differential/Platelet     Status: Abnormal   Collection Time: 02/05/16 10:41 AM  Result Value Ref Range   WBC 12.8 (H) 3.8 - 10.6 K/uL   RBC 4.58 4.40 - 5.90 MIL/uL   Hemoglobin 14.2 13.0 - 18.0 g/dL   HCT 41.4 40.0 - 52.0 %   MCV 90.2 80.0 - 100.0 fL   MCH 31.0 26.0 - 34.0 pg   MCHC 34.3 32.0 - 36.0 g/dL   RDW 13.0 11.5 - 14.5 %   Platelets 287 150 - 440 K/uL   Neutrophils  Relative % 83 %   Neutro Abs 10.6 (H) 1.4 - 6.5 K/uL   Lymphocytes Relative 12 %   Lymphs Abs 1.5 1.0 - 3.6 K/uL   Monocytes Relative 4 %   Monocytes Absolute 0.5 0.2 - 1.0 K/uL   Eosinophils Relative 1 %   Eosinophils Absolute 0.1 0 - 0.7 K/uL   Basophils Relative 0 %   Basophils Absolute 0.0 0 - 0.1 K/uL  Comprehensive metabolic panel     Status: Abnormal   Collection Time: 02/05/16 10:41 AM  Result Value Ref Range   Sodium 138 135 - 145 mmol/L   Potassium 4.3 3.5 - 5.1 mmol/L   Chloride 101 101 - 111 mmol/L   CO2 28 22 - 32 mmol/L   Glucose, Bld 171 (H) 65 - 99 mg/dL   BUN 18 6 - 20 mg/dL   Creatinine, Ser 0.70 0.61 - 1.24 mg/dL   Calcium 9.9 8.9 - 10.3 mg/dL   Total Protein 7.7 6.5 - 8.1 g/dL   Albumin 4.7 3.5 - 5.0 g/dL   AST 20 15 - 41 U/L   ALT 23 17 - 63 U/L   Alkaline Phosphatase 163 (H) 38 - 126 U/L   Total Bilirubin 0.7 0.3 - 1.2 mg/dL   GFR calc non Af Amer >60 >60 mL/min   GFR calc Af Amer >60 >60 mL/min    Comment: (NOTE) The eGFR has been calculated using the CKD EPI equation. This calculation has not been validated in all clinical situations. eGFR's persistently <60 mL/min signify possible Chronic Kidney Disease.    Anion gap 9 5 - 15  Sedimentation rate     Status: None   Collection Time: 02/05/16 10:41 AM  Result Value Ref Range   Sed Rate 18 0 - 20 mm/hr   Mr Cervical Spine W Wo Contrast  Result Date: 02/05/2016 CLINICAL DATA:  Cervical spine fusion surgery July 31st, now with numbness and weakness in all extremities, worse over the past 2 days. Patient developed a shooting pain in the neck down the back and into the legs 1 week ago, which was temporarily improved with a steroid taper. EXAM: MRI CERVICAL SPINE WITHOUT AND WITH CONTRAST TECHNIQUE: Multiplanar and multiecho pulse sequences of the cervical spine, to include the craniocervical junction and cervicothoracic junction, were obtained according to standard protocol without and with intravenous  contrast. CONTRAST:  40m MULTIHANCE GADOBENATE DIMEGLUMINE 529 MG/ML IV SOLN COMPARISON:  Preoperative MRI 12/16/2015. Intraoperative  spot radiograph 01/16/2016. FINDINGS: Alignment: Anatomic. Vertebrae: Susceptibility artifact related to C4-5 ACDF with plate and screw fixation. Some edema and enhancement of the C4 and C5 vertebral bodies, not unexpected given recent surgery. Cord: Cord compression at the C4-5 level, related to a contained fluid collection projecting posteriorly from the interspace. No definite abnormal cord signal. Canal AP diameter approximately 7 mm. Posterior Fossa: Unremarkable Vertebral Arteries: Flow voids are equal bilaterally. Paraspinal tissues: No significant fluid collection in the anterior neck. Disc levels: The individual disc spaces were examined as follows: C2-3: BILATERAL facet arthropathy with ligamentum flavum hypertrophy and infolding. Osseous spurring, worse on the LEFT, with mild foraminal narrowing. C3-4: Moderate stenosis related to short pedicles, posterior element hypertrophy, and osseous ridging. Suspected anterior spurring contributes to functional arthrodesis. No definite foraminal narrowing. C4-5: Contained fluid collection projects posteriorly from the interspace. Approximate cross-sectional measurements are 20 x 9 x 8 mm. Interbody cage appears to remain centered within the C4-5 disc space. Small amount of T2 hyperintense fluid projects slightly anterior and rightward to the cage, see image 15 series 6. Posterior margin of the fluid collection demonstrates postcontrast enhancement. Mild effect on the cord and C5 nerve roots, greater on the LEFT. Ligamentum flavum infolding contributes to stenosis, estimated 7 mm AP diameter. C5-6:  Annular bulge.  No impingement. C6-7:  Facet degenerative change, annular bulge.  No impingement. C7-T1:  Unremarkable. IMPRESSION: Abnormal fluid collection projecting dorsally from the C4-5 interspace, status post C4-5 ACDF on  01/16/2016. Mild peripheral enhancement. Mild cord flattening and effacement of both C5 nerve roots. No definite abnormal cord signal. Postoperative seroma is favored. Abscess not excluded. Mild edema in the C4 and C5 vertebral bodies, not necessarily unexpected given recent surgery. Consider noncontrast CT of the cervical spine to assess for hardware integrity and/or cage subsidence. Electronically Signed   By: Staci Righter M.D.   On: 02/05/2016 11:23    Review of Systems  Constitutional: Negative.   HENT: Negative.   Eyes: Negative.   Respiratory: Negative.   Cardiovascular: Negative.   Gastrointestinal: Negative.   Musculoskeletal: Positive for neck pain.  Skin: Negative.   Neurological: Positive for sensory change and focal weakness.  Psychiatric/Behavioral: Negative.    weakness and numbness all over, unable to walk  There were no vitals taken for this visit. Physical Exam hent, nl. Neck anterior scar. Cv, nl. Lungs, clear. Abdomen, nl. Extremities, n. NEURO  Oriented x3. Motor quadriparetic unable to stand with more compromise of the hands . dtr 3 plus. No babinski. Sensory decrease to pinprick from c6 down. Perineal numbness with decrease ractal tone. Mri shows stenosis at c45, fluid collection anterior to the cord which enhances with contrast  Assessment/Plan The patient  Agrees with exploration asap. He is awarethat I have no way to predict how well he will do but I have no other option and waiting thinking that he might have also a central cord injury I am concern how fast he is getting worse. Spoke with wife by phone. Or team called  Floyce Stakes, MD 02/05/2016, 5:01 PM

## 2016-02-05 NOTE — ED Triage Notes (Signed)
Patient states that he had surgery in July for spinal stenosis. Patient states that about a week ago he developed a shooting pain from his neck down his back and into his legs. Patient states that he took a prednisone taper that improved his symptoms. Patient reports that he took his last dose yesterday and the pain has come back.

## 2016-02-05 NOTE — ED Notes (Signed)
Patient was taken to MRI without any issues.

## 2016-02-05 NOTE — Anesthesia Postprocedure Evaluation (Signed)
Anesthesia Post Note  Patient: Gary Craig  Procedure(s) Performed: Procedure(s) (LRB): Exploration of Cervical four - five wound. Removal of cervical four - five instrumentation. Insertion of new cervical four - five instrumentation. (N/A)  Patient location during evaluation: PACU Anesthesia Type: General Level of consciousness: awake and alert Pain management: pain level controlled Vital Signs Assessment: post-procedure vital signs reviewed and stable Respiratory status: spontaneous breathing, nonlabored ventilation, respiratory function stable and patient connected to nasal cannula oxygen Cardiovascular status: blood pressure returned to baseline and stable Postop Assessment: no signs of nausea or vomiting Anesthetic complications: no    Last Vitals:  Vitals:   02/05/16 2115 02/05/16 2140  BP: (!) 169/88 (!) 176/85  Pulse: 89 87  Resp: 20 17  Temp: 36.8 C 36.8 C    Last Pain:  Vitals:   02/05/16 2140  TempSrc: Oral  PainSc:                  Tiajuana Amass

## 2016-02-06 ENCOUNTER — Encounter (HOSPITAL_COMMUNITY): Payer: Self-pay | Admitting: Neurosurgery

## 2016-02-06 ENCOUNTER — Inpatient Hospital Stay (HOSPITAL_COMMUNITY): Payer: Medicare Other

## 2016-02-06 LAB — GLUCOSE, CAPILLARY
GLUCOSE-CAPILLARY: 197 mg/dL — AB (ref 65–99)
GLUCOSE-CAPILLARY: 213 mg/dL — AB (ref 65–99)
Glucose-Capillary: 205 mg/dL — ABNORMAL HIGH (ref 65–99)
Glucose-Capillary: 210 mg/dL — ABNORMAL HIGH (ref 65–99)

## 2016-02-06 LAB — PREPARE PLATELET PHERESIS: Unit division: 0

## 2016-02-06 NOTE — Evaluation (Signed)
Clinical/Bedside Swallow Evaluation Patient Details  Name: EBIN MENNINGER MRN: [id number] Date of Birth: [birth year]  Today's Date: 02/06/2016 Time: SLP Start Time (ACUTE ONLY): [id number] SLP Stop Time (ACUTE ONLY): 1004 SLP Time Calculation (min) (ACUTE ONLY): 11 min  Past Medical History:  Past Medical History:  Diagnosis Date  . CAD (coronary artery disease)   . Carotid stenosis   . Diabetes mellitus without complication ([place])   . GERD (gastroesophageal reflux disease)    zantac as needed  . High cholesterol   . History of kidney stones   . Hypothyroidism   . S/P CABG (coronary artery bypass graft)   . Skin cancer   . Stroke [facility]) 2012   tia   Past Surgical History:  Past Surgical History:  Procedure Laterality Date  . ANTERIOR CERVICAL DECOMP/DISCECTOMY FUSION N/A 01/16/2016   Procedure: Cervical Four-Five Anterior cervical decompression/diskectomy/fusion;  Surgeon: Kary Kos, MD;  Location: [place] [facility];  Service: Neurosurgery;  Laterality: N/A;  right side approach  . CORONARY ARTERY BYPASS GRAFT  11/29/2004  . EAR BIOPSY     skin ca  . LITHOTRIPSY Right   . TONSILLECTOMY     HPI:  82 year old male s/p ACDF C4-5 due to stenosis with myelopathy 3 weeks ago admitted with increased pain and weakness. Initially brought to [facility] [place] by the time he arrived at [facility]. Diagnosed with hematoma at the level of his recent fusion. S/p exploration of cervical fusion with removal of the plate and bone graft and evacuation of epidural hematoma.    Assessment / Plan / Recommendation Clinical Impression  Patient presents with evidence of a pharyngeal dysphagia, likely a result of post op edema and/or presence of cervical hardware impacting full epiglottic deflection and laryngeal closure. S/s of aspiration across consistencies tested. Recommend MBS to determine least restrictive diet.     Aspiration Risk  Moderate aspiration risk    Diet Recommendation NPO   Medication  Administration: Via alternative means    Other  Recommendations Oral Care Recommendations: Oral care QID   Follow up Recommendations  Inpatient Rehab               Swallow Study   General HPI: 82 year old male s/p ACDF C4-5 due to stenosis with myelopathy 3 weeks ago admitted with increased pain and weakness. Initially brought to [facility] [place] by the time he arrived at [facility]. Diagnosed with hematoma at the level of his recent fusion. S/p exploration of cervical fusion with removal of the plate and bone graft and evacuation of epidural hematoma.  Type of Study: Bedside Swallow Evaluation Previous Swallow Assessment: none Diet Prior to this Study: NPO Temperature Spikes Noted: No Respiratory Status: Nasal cannula History of Recent Intubation: No Behavior/Cognition: Alert;Cooperative;Pleasant mood Oral Cavity Assessment: Within Functional Limits Oral Care Completed by SLP: No Oral Cavity - Dentition: Adequate natural dentition Vision: Functional for self-feeding Self-Feeding Abilities: Needs assist Patient Positioning: Upright in chair Baseline Vocal Quality: Normal Volitional Cough: Strong Volitional Swallow: Able to elicit    Oral/Motor/Sensory Function Overall Oral Motor/Sensory Function: Within functional limits   Ice Chips Ice chips: Impaired Presentation: Spoon Pharyngeal Phase Impairments: Multiple swallows;Throat Clearing - Immediate   Thin Liquid Thin Liquid: Impaired Presentation: Cup;Self Fed (with HOH assist) Pharyngeal  Phase Impairments: Multiple swallows;Throat Clearing - Immediate;Cough - Immediate    Nectar Thick Nectar Thick Liquid: Not tested   Honey Thick Honey Thick Liquid: Not tested   Puree Puree: Impaired Presentation: Spoon Pharyngeal Phase Impairments: Multiple  swallows;Throat Clearing - Immediate;Throat Clearing - Delayed   Solid   GO  Treanna Dumler MA, CCC-SLP [phone]  Solid: Not tested        Thai Burgueno Meryl 02/06/2016,10:08  AM

## 2016-02-06 NOTE — Evaluation (Signed)
Physical Therapy Evaluation Patient Details Name: Gary Craig MRN: [id number] DOB: February 05, 1934 Today's Date: 02/06/2016   History of Present Illness  82 year old male s/p ACDF C4-5 (01/16/16) due to stenosis with myelopathy 3 weeks ago admitted with increased pain and weakness. Initially brought to [facility] [place] by the time he arrived at [facility] on 02/05/16. Diagnosed with hematoma at the level of his recent fusion. S/p exploration of cervical fusion with removal of the plate and bone graft and evacuation of epidural hematoma (also on 02/05/16).  Patient with other significant PMHx of stroke, CABG, DM, and CAD.  Clinical Impression  Pt very motivated to do better, but very impulsive and quick to move despite significant 4 extremity and trunkal deficits.  Pt would benefit from CIR level therapies prior to d/c with supportive family.   PT to follow acutely for deficits listed below.       Follow Up Recommendations CIR    Equipment Recommendations  Rolling walker with 5" wheels;3in1 (PT)    Recommendations for Other Services Rehab consult     Precautions / Restrictions Precautions Precautions: Fall;Cervical Required Braces or Orthoses: Cervical Brace Cervical Brace: Hard collar (order for both hard and soft collar, in ASPEN collar on eval)      Mobility  Bed Mobility Overal bed mobility: Needs Assistance Bed Mobility: Rolling;Sidelying to Sit Rolling: +2 for safety/equipment;Mod assist Sidelying to sit: +2 for physical assistance;Mod assist       General bed mobility comments: Two person mod assist to help pt flex knees and support trunk to preform log roll to left side.  Assist needed to progress bil legs over EOB (especially right leg), and to support trunk to transition up to left elbow and then all the way up to sitting EOB.    Transfers Overall transfer level: Needs assistance Equipment used: None Transfers: Sit to/from Stand Sit to Stand: +2 physical assistance;Mod  assist         General transfer comment: Pt needed support at trunk to power up over weak and buckling knees.  Pt able to lock knees out and stand with better knee, hip and trunk extension with mod two person assist.  Pt was able to take a few shuffling steps over to the recliner chair with the support and guarding of buckling knees bil.  Pt with difficulty moving and progressing feet over to take steps.   Ambulation/Gait             General Gait Details: unable at this time, I would like to try [facility] walker next session.                       Balance Overall balance assessment: Needs assistance Sitting-balance support: Bilateral upper extremity supported;Feet supported Sitting balance-Leahy Scale: Poor Sitting balance - Comments: Pt with poor trunk control, LOB in all directions when trying to weight shift, heavy reliance on hands and feet supported to maintain balance EOB.  Min assist to close supervision at times.  Postural control: Posterior lean Standing balance support: Bilateral upper extremity supported Standing balance-Leahy Scale: Zero Standing balance comment: needs significant support to stand EOB.                              Pertinent Vitals/Pain Pain Assessment: No/denies pain Pain Score: 0-No pain    Home Living Family/patient expects to be discharged to:: Private residence Living Arrangements: Spouse/significant other Available Help  at Discharge: Family;Available 24 hours/day Type of Home: House Home Access: Stairs to enter Entrance Stairs-Rails: Right Entrance Stairs-Number of Steps: 3 Home Layout: Two level;Able to live on main level with bedroom/bathroom (his office is upstairs) Home Equipment: Grab bars - tub/shower;Cane - single point      Prior Function Level of Independence: Independent         Comments: walk 5-6 miles Monday through friday for 40 years and play golf twice a week.      Hand Dominance   Dominant Hand:  Right    Extremity/Trunk Assessment   Upper Extremity Assessment: Defer to OT evaluation           Lower Extremity Assessment: RLE deficits/detail;LLE deficits/detail RLE Deficits / Details: right leg is weaker than left leg.  Right ankle 2+/5, right knee 3-/5, right hip 2+/5 LLE Deficits / Details: left leg stronger than right leg, ankle 3/5, knee 3-/5, hip 3-/5  Cervical / Trunk Assessment: Other exceptions  Communication   Communication: No difficulties  Cognition Arousal/Alertness: Awake/alert Behavior During Therapy: Impulsive Overall Cognitive Status: Impaired/Different from baseline Area of Impairment: Safety/judgement         Safety/Judgement: Decreased awareness of safety     General Comments: Pt impulsive and quick to move throughout session.         Exercises Total Joint Exercises Towel Squeeze: AROM;Both;10 reps General Exercises - Lower Extremity Ankle Circles/Pumps: AROM;AAROM;Both;20 reps Quad Sets: AROM;Both;10 reps Heel Slides: AAROM;Both;10 reps Hip ABduction/ADduction: AAROM;Both;10 reps Straight Leg Raises: AAROM;Both;10 reps      Assessment/Plan    PT Assessment Patient needs continued PT services  PT Diagnosis Difficulty walking;Abnormality of gait;Generalized weakness;Quadraplegia   PT Problem List Decreased strength;Decreased activity tolerance;Decreased balance;Decreased mobility;Decreased cognition;Decreased coordination;Decreased knowledge of use of DME;Decreased safety awareness;Decreased knowledge of precautions;Impaired sensation;Impaired tone  PT Treatment Interventions DME instruction;Gait training;Stair training;Functional mobility training;Therapeutic activities;Therapeutic exercise;Balance training;Neuromuscular re-education;Cognitive remediation;Patient/family education   PT Goals (Current goals can be found in the Care Plan section) Acute Rehab PT Goals Patient Stated Goal: to have full return of function PT Goal Formulation:  With patient/family Time For Goal Achievement: 02/20/16 Potential to Achieve Goals: Good    Frequency Min 5X/week           End of Session Equipment Utilized During Treatment: Gait belt;Cervical collar Activity Tolerance: Patient tolerated treatment well Patient left: in chair;with call bell/phone within reach Nurse Communication: Mobility status         Time: [id number] PT Time Calculation (min) (ACUTE ONLY): 47 min   Charges:   PT Evaluation $PT Eval Moderate Complexity: 1 Procedure PT Treatments $Therapeutic Exercise: 8-22 mins $Therapeutic Activity: 8-22 mins        [place] Hert B. [place], [place], DPT [phone]   02/06/2016, 10:54 PM

## 2016-02-06 NOTE — Progress Notes (Signed)
Patient ID: Gary Craig, male   DOB: 1933/11/13, 82 y.o.   MRN: [id number] Stable. To have a swallow teat. Moving better. Drain working well. Dr [name] to see him

## 2016-02-06 NOTE — Progress Notes (Signed)
Physical medicine rehabilitation consult requested chart reviewed. Patient postop exploration of cervical 4-5 fusion with quadriparesis and surgical intervention late evening of 02/05/2016. Await physical and occupational therapy evaluations and follow-up with appropriate recommendations

## 2016-02-06 NOTE — Op Note (Signed)
Gary Craig, Gary Craig NO.:  [id number]  MEDICAL RECORD NO.:  [id number]  LOCATION:  3M06C                        FACILITY:  [facility]  PHYSICIAN:  Leeroy Cha, M.D.   DATE OF BIRTH:  February 09, 1934  DATE OF PROCEDURE: DATE OF DISCHARGE:                              OPERATIVE REPORT   Neuro ICU 3100.  PREOPERATIVE DIAGNOSIS:  Quadriparesis status post anterior cervical 4-5 diskectomy 3 weeks ago.  POSTOPERATIVE DIAGNOSIS:  Quadriparesis status post anterior cervical 4- 5 diskectomy 3 weeks ago.  PROCEDURE:  Exploration of the cervical 4-5 fusion.  Removal of the plate and the bone graft.  Evacuation of epidural hematoma.  Insertion of a new allograft of 9 mm height, new plate.  Microscope.  SURGEON:  Leeroy Cha, M.D.  CLINICAL HISTORY:  Gary Craig is a 82 year old gentleman who underwent surgery about 3 weeks ago because of stenosis with myelopathy at the level of cervical 4-5.  The patient did well, but he was complaining of some neck pain.  He complains of numbness sensation in the hand.  He was given cortisone and indeed he was doing really well.  The last time, he felt that he is having more pain and more weakness and his wife brought him to [facility] early this morning at 4 o'clock.  The patient himself as well as his wife told me that he was able to walk into the emergency room.  Nevertheless, the patient had MRI and by the time he came to [facility] he was quadriparetic.  He was unable to move, although he was able to move his both arms, nevertheless they were quite weak, worse in the hands and the right leg was almost completely paralyzed with some moving the left leg.  He has no rectal tone.  The MRI was seen and indeed he has a compromise anterior at the level of the fusion probably hematoma.  He had been taking Plavix and the last one was yesterday.  His platelet 200,000.  Nevertheless, this gentleman, we can see that he his progressing.   I told him that we need to take him to surgery as soon as possible knowing that we would be taking a risk with the bleeding.  He is fully aware as well as the wife about the outcome including _no improvement_________ whatsoever needed physical therapy and probably fused with decompression posterior at a later date.  Prior to surgery, I gave 6 units of platelets.  DESCRIPTION OF PROCEDURE:  The patient was taken to the OR, and after intubation, we introduced a Foley catheter.  1100 mL came immediately. Prior to that, I did a rectal examination when he was in the PACU before surgery and the rectal tone was quite _abnormal_.  He has hypotonia. After the right side of the neck was cleaned with DuraPrep, drapes were applied.  Transverse incision was made through the skin and subcutaneous tissue.  The patient already has quite a bit of fibrosis.  Dissection was carried down and we were able to identify the area where the plate was.  Fibrosis right in front of the plate was removed.  Then, we started removing the plate and we found the  inferior screws in the left side was interfaced between the bone graft and C5.  The bone graft was  removed  with a hook after distraction of the interspace  We brought immediately the microscope into the area.  We were unable to see the dura mater and after dissection and irrigation, we were able to remove probably what looked like the Gel-Foam with some clot. Decompression above to C4 and down to C5 as well as the C5 nerve root was accomplished.  The longest part of the procedure was wearing it to be 100% sure that we achieve hemostasis before we proceed.  Once the hemostasis was done, we were able to introduce a new allograft which this time was 9 mm, secured in place.  Then, a new plate 20 mm was used using 4 screws.  Lateral cervical spine showed good position of the graft and the plate.  Once again, the area was irrigated.  I waited 10 minutes just to be  sure that we have hemostasis, although we did.  We left large drain into the area.  The wound was closed with Vicryl and nylon for the skin.  After the procedure, the patient was extubated.  He was awake, talking.  He was able to move both upper extremities much better and also more movement of both lower extremities.  He is going to go to the intensive care unit.  We are going to get hold of Rehabilitation Medicine in the morning.          ______________________________ Leeroy Cha, M.D.     EB/MEDQ  D:  02/05/2016  T:  02/06/2016  Job:  [id number]

## 2016-02-06 NOTE — Progress Notes (Signed)
MBSS complete. Full report located under chart review in imaging section.  Carlee Vonderhaar MA, CCC-SLP [phone]   

## 2016-02-07 ENCOUNTER — Other Ambulatory Visit: Payer: Self-pay | Admitting: Neurosurgery

## 2016-02-07 DIAGNOSIS — I2510 Atherosclerotic heart disease of native coronary artery without angina pectoris: Secondary | ICD-10-CM

## 2016-02-07 DIAGNOSIS — Z419 Encounter for procedure for purposes other than remedying health state, unspecified: Secondary | ICD-10-CM

## 2016-02-07 DIAGNOSIS — M4712 Other spondylosis with myelopathy, cervical region: Secondary | ICD-10-CM

## 2016-02-07 DIAGNOSIS — Z8673 Personal history of transient ischemic attack (TIA), and cerebral infarction without residual deficits: Secondary | ICD-10-CM

## 2016-02-07 DIAGNOSIS — R0682 Tachypnea, not elsewhere classified: Secondary | ICD-10-CM

## 2016-02-07 DIAGNOSIS — R1310 Dysphagia, unspecified: Secondary | ICD-10-CM

## 2016-02-07 DIAGNOSIS — D72829 Elevated white blood cell count, unspecified: Secondary | ICD-10-CM

## 2016-02-07 DIAGNOSIS — E119 Type 2 diabetes mellitus without complications: Secondary | ICD-10-CM

## 2016-02-07 DIAGNOSIS — G8918 Other acute postprocedural pain: Secondary | ICD-10-CM

## 2016-02-07 LAB — GLUCOSE, CAPILLARY
GLUCOSE-CAPILLARY: 188 mg/dL — AB (ref 65–99)
GLUCOSE-CAPILLARY: 184 mg/dL — AB (ref 65–99)
GLUCOSE-CAPILLARY: 149 mg/dL — AB (ref 65–99)
Glucose-Capillary: 198 mg/dL — ABNORMAL HIGH (ref 65–99)
Glucose-Capillary: 187 mg/dL — ABNORMAL HIGH (ref 65–99)

## 2016-02-07 NOTE — Progress Notes (Signed)
Pt received with no noted distress. Pt stable, neuro intact. Telemetry monitoring. Surgical dressing site with old marked drainage dry and intact. Pt denies pain or discomfort. Aspen collar on and aligned. JP drain in place. Pt oriented to room. Safety measures in place. Call bell within reach. Will continue to monitor. No family at bedside at this time.

## 2016-02-07 NOTE — Progress Notes (Signed)
Patient ID: Gary Craig, male   DOB: May 31, 1934, 82 y.o.   MRN: [id number] Patient was significant clinical improvement overnight increase in mobility and strength is still 4/5 and intrinsics 4+ to 5 out of 5 proximally upper and lower extremities.  Wound with mild saturation drain seems to be functioning  Plan posterior cervical decompression tomorrow will transfer the floor

## 2016-02-07 NOTE — Progress Notes (Signed)
Speech Language Pathology Treatment: Dysphagia  Patient Details Name: SAMVIT PEOT MRN: [id number] DOB: 12-28-1933 Today's Date: 02/07/2016 Time: 1435-1450 SLP Time Calculation (min) (ACUTE ONLY): 15 min  Assessment / Plan / Recommendation Clinical Impression  Pt recalled swallow precautions without cues needed. Suspect airway compromise x 1 due to phonating immediately after swallow with thin. Min verbal reminders for volitional cough after 2- 3 sips. Taking small sips via straw. Likely pharyngeal residue indicated by multiple swallows. Pt reported he is having a posterior cervical fusion tomorrow. Will see likely Thursday.    HPI HPI: 82 year old male s/p ACDF C4-5 due to stenosis with myelopathy 3 weeks ago admitted with increased pain and weakness. Initially brought to [facility] [place] by the time he arrived at [facility]. Diagnosed with hematoma at the level of his recent fusion. S/p exploration of cervical fusion with removal of the plate and bone graft and evacuation of epidural hematoma.       SLP Plan  Continue with current plan of care     Recommendations  Diet recommendations: Dysphagia 1 (puree);Thin liquid Liquids provided via: Cup;Straw Medication Administration: Crushed with puree Supervision: Patient able to self feed Compensations: Slow rate;Small sips/bites;Hard cough after swallow Postural Changes and/or Swallow Maneuvers: Seated upright 90 degrees             Oral Care Recommendations: Oral care BID Follow up Recommendations: Inpatient Rehab Plan: Continue with current plan of care     GO                Houston Siren 02/07/2016, 3:11 PM   Orbie Pyo Colvin Caroli.[employer] [phone]

## 2016-02-07 NOTE — Consult Note (Signed)
Physical Medicine and Rehabilitation Consult Reason for Consult: Quadriparesis Referring Physician: Dr. [name]   HPI: Gary Craig is a 82 y.o. right handed male with history of diabetes mellitus, CAD status post CABG, CVA maintained on  Plavix. Per chart review patient lives with wife 2 level home with bedroom on main floor. 3 steps to entry. Independent prior to admission and active prior to initial ACDF. 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 [name] and discharged to home 01/16/2016. Patient developed neck pain and numbness sensation in the hands with progressive quadriparesis. MRI of the cervical spine showed abnormal fluid collection projecting dorsally from the C4-5 interspace. Mild peripheral enhancement. Mild cord flattening and effacement of both C5 nerve roots. Mild edema in the C4 and C5 vertebral bodies. Underwent exploration of C4-5 fusion. Removal of the plate and the bone graft. Evacuation of epidural hematoma. Insertion of a new allograft 9 mm height plate 02/06/2016 per Dr. [name]. Hospital course pain management. Cervical collar was placed. Decadron protocol as indicated. Currently on a dysphagia #1 thin liquid diet. Physical therapy evaluation completed 02/06/2016 with recommendations of physical medicine rehabilitation consult.   Review of Systems  Constitutional: Negative for chills and fever.  HENT: Negative for hearing loss.   Eyes: Negative for blurred vision and double vision.  Respiratory: Negative for cough and shortness of breath.   Cardiovascular: Negative for palpitations and leg swelling.  Gastrointestinal: Positive for constipation. Negative for nausea and vomiting.       GERD  Genitourinary: Positive for urgency. Negative for dysuria and hematuria.  Musculoskeletal: Positive for neck pain.  Skin: Negative for rash.  Neurological: Positive for sensory change, focal  weakness and weakness. Negative for seizures and headaches.       Numbness upper extremities  All other systems reviewed and are negative.  Past Medical History:  Diagnosis Date  . CAD (coronary artery disease)   . Carotid stenosis   . Diabetes mellitus without complication ([place])   . GERD (gastroesophageal reflux disease)    zantac as needed  . High cholesterol   . History of kidney stones   . Hypothyroidism   . S/P CABG (coronary artery bypass graft)   . Skin cancer   . Stroke [facility]) 2012   tia   Past Surgical History:  Procedure Laterality Date  . ANTERIOR CERVICAL DECOMP/DISCECTOMY FUSION N/A 01/16/2016   Procedure: Cervical Four-Five Anterior cervical decompression/diskectomy/fusion;  Surgeon: Kary Kos, MD;  Location: [facility] OR[facility];  Service: Neurosurgery;  Laterality: N/A;  right side approach  . CERVICAL WOUND DEBRIDEMENT N/A 02/05/2016   Procedure: Exploration of Cervical four - five wound. Removal of cervical four - five instrumentation. Insertion of new cervical four - five instrumentation.;  Surgeon: Leeroy Cha, MD;  Location: [facility];  Service: Neurosurgery;  Laterality: N/A;  Exploration of Cervical four - five wound. Removal of cervical four - five instrumentation. Insertion of new cervical four - five instrumentatio  . CORONARY ARTERY BYPASS GRAFT  11/29/2004  . EAR BIOPSY     skin ca  . LITHOTRIPSY Right   . TONSILLECTOMY     Family History  Problem Relation Age of Onset  . CAD Brother   . Heart block Brother    Social History:  reports that he quit smoking about 33 years ago. His smoking use included Cigarettes, Pipe, and Cigars. He smoked 1.00 pack per day. He has never used smokeless  tobacco. He reports that he drinks alcohol. He reports that he does not use drugs. Allergies:  Allergies  Allergen Reactions  . No Known Allergies    Medications Prior to Admission  Medication Sig Dispense Refill  . atorvastatin (LIPITOR) 40 MG tablet Take 1 tablet by  mouth at bedtime.     . clopidogrel (PLAVIX) 75 MG tablet Take 1 tablet (75 mg total) by mouth daily. (Patient taking differently: Take 75 mg by mouth every morning. ) 30 tablet 0  . Glucosamine-Chondroitin 500-400 MG CAPS Take 1 tablet by mouth every morning.     Marland Kitchen levothyroxine (SYNTHROID, LEVOTHROID) 112 MCG tablet Take 112 mcg by mouth daily before breakfast.     . lisinopril (PRINIVIL,ZESTRIL) 2.5 MG tablet Take 2.5 mg by mouth every morning.     . metFORMIN (GLUCOPHAGE-XR) 500 MG 24 hr tablet Take 500 mg by mouth daily with breakfast.    . Multiple Vitamin (MULTIVITAMIN WITH MINERALS) TABS tablet Take 1 tablet by mouth every morning.     Marland Kitchen omega-3 acid ethyl esters (LOVAZA) 1 g capsule Take 1 g by mouth 2 (two) times daily.    Marland Kitchen oxyCODONE-acetaminophen (PERCOCET/ROXICET) 5-325 MG tablet Take 1 tablet by mouth every 6 (six) hours as needed for pain.    . ranitidine (ZANTAC) 75 MG tablet Take 75 mg by mouth daily as needed for heartburn.       Home: Home Living Family/patient expects to be discharged to:: Private residence Living Arrangements: Spouse/significant other Available Help at Discharge: Family, Available 24 hours/day Type of Home: House Home Access: Stairs to enter [employer] of Steps: 3 Entrance Stairs-Rails: Right Home Layout: Two level, Able to live on main level with bedroom/bathroom (his office is upstairs) Alternate Level Stairs-Number of Steps: flight Bathroom Shower/Tub: Chiropodist: Standard Home Equipment: Grab bars - tub/shower, Cane - single point  Functional History: Prior Function Level of Independence: Independent Comments: walk 5-6 miles Monday through friday for 40 years and play golf twice a week.  Functional Status:  Mobility: Bed Mobility Overal bed mobility: Needs Assistance Bed Mobility: Rolling, Sidelying to Sit Rolling: +2 for safety/equipment, Mod assist Sidelying to sit: +2 for physical assistance, Mod  assist General bed mobility comments: Two person mod assist to help pt flex knees and support trunk to preform log roll to left side.  Assist needed to progress bil legs over EOB (especially right leg), and to support trunk to transition up to left elbow and then all the way up to sitting EOB.   Transfers Overall transfer level: Needs assistance Equipment used: None Transfers: Sit to/from Stand Sit to Stand: +2 physical assistance, Mod assist General transfer comment: Pt needed support at trunk to power up over weak and buckling knees.  Pt able to lock knees out and stand with better knee, hip and trunk extension with mod two person assist.  Pt was able to take a few shuffling steps over to the recliner chair with the support and guarding of buckling knees bil.  Pt with difficulty moving and progressing feet over to take steps.  Ambulation/Gait General Gait Details: unable at this time, I would like to try [facility] walker next session.      ADL: ADL Overall ADL's : Needs assistance/impaired Eating/Feeding: NPO Grooming: Oral care, Wash/dry face, Moderate assistance, Sitting, Minimal assistance Grooming Details (indicate cue type and reason): pt able to hold wash cloth iwth L hand to wash face. pt needed (A) hand over hand to complete oral  care. pt; unable to sustain grasp on tooth brush even with red foam General ADL Comments: Pt completed sit<.stand from chair x2 during session and attempting to shift weight to step with alternating bil LE. pt able to shift weight with (A)> Pt reports putting weight on LLE due to feelign like R LE was going to buckle. Pt attempting to step with R LE and then L LE and bil LE buckled. pt fatigues and lack of awareness to fatigue. pt very motivated to return to movement. pt wallks daily for years and very emotional about currently situation.  Cognition: Cognition Overall Cognitive Status: Impaired/Different from baseline Orientation Level: Oriented  X4 Cognition Arousal/Alertness: Awake/alert Behavior During Therapy: Impulsive Overall Cognitive Status: Impaired/Different from baseline Area of Impairment: Safety/judgement Safety/Judgement: Decreased awareness of safety General Comments: Pt impulsive and quick to move throughout session.    Blood pressure 138/65, pulse 70, temperature 98.5 F (36.9 C), temperature source Oral, resp. rate 20, height 5\' 8"  (1.727 m), weight 70 kg (154 lb 6.4 oz), SpO2 98 %. Physical Exam  Vitals reviewed. Constitutional: He is oriented to person, place, and time. He appears well-developed and well-nourished.  HENT:  Head: Normocephalic.  Right Ear: External ear normal.  Left Ear: External ear normal.  Eyes: Conjunctivae and EOM are normal.  Neck:  Cervical collar in place  Cardiovascular: Normal rate and regular rhythm.   Respiratory: Effort normal and breath sounds normal. No respiratory distress.  GI: Soft. Bowel sounds are normal. He exhibits no distension.  Musculoskeletal: He exhibits no edema or tenderness.  Neurological: He is alert and oriented to person, place, and time.  Sensation diminished to light touch right hand and foot DTRs symmetric Motor: RUE: shoulder abduction, elbow flexion/extension 5/5, hand grip 3/5 LUE: shoulder abduction, elbow flexion/extension 5/5, hand grip 4+/5 LLE: Hip flexion, knee extension 4+/5, ankle dorsi/plantar flexion 5/5 RLE: Hip flexion 2+/5, knee extension 3/5, ankle dorsi/plantar flexion 5/5 Decreased FMC >right  Skin: Skin is warm and dry.  Surgical site is dressed with drain in place  Psychiatric: He has a normal mood and affect. His behavior is normal.    Results for orders placed or performed during the hospital encounter of 02/05/16 (from the past 24 hour(s))  Glucose, capillary     Status: Abnormal   Collection Time: 02/06/16  8:06 AM  Result Value Ref Range   Glucose-Capillary 197 (H) 65 - 99 mg/dL   Comment 1 Notify RN    Comment 2  Document in Chart   Glucose, capillary     Status: Abnormal   Collection Time: 02/06/16 12:44 PM  Result Value Ref Range   Glucose-Capillary 213 (H) 65 - 99 mg/dL   Comment 1 Notify RN    Comment 2 Document in Chart   Glucose, capillary     Status: Abnormal   Collection Time: 02/06/16  4:43 PM  Result Value Ref Range   Glucose-Capillary 205 (H) 65 - 99 mg/dL  Glucose, capillary     Status: Abnormal   Collection Time: 02/06/16 11:05 PM  Result Value Ref Range   Glucose-Capillary 210 (H) 65 - 99 mg/dL   Dg Cervical Spine 2-3 Views  Result Date: 02/05/2016 CLINICAL DATA:  Surgery at C4-C5. EXAM: CERVICAL SPINE - 2-3 VIEW COMPARISON:  Intraoperative fluoroscopic spot views 01/06/2016 FINDINGS: Two lateral views of the cervical spine demonstrate surgical instruments anterior to C4-C5. Images labeled #1 demonstrates anterior fusion plate at [id number] with no fixating screws. An interbody spacer is in place. Image  labeled #2 demonstrates placement of fixating screws. The patient is intubated. IMPRESSION: Lateral spot views of the cervical spine localizing to C4-C5. Electronically Signed   By: Jeb Levering M.D.   On: 02/05/2016 21:09   Mr Cervical Spine W Wo Contrast  Result Date: 02/05/2016 CLINICAL DATA:  Cervical spine fusion surgery July 31st, now with numbness and weakness in all extremities, worse over the past 2 days. Patient developed a shooting pain in the neck down the back and into the legs 1 week ago, which was temporarily improved with a steroid taper. EXAM: MRI CERVICAL SPINE WITHOUT AND WITH CONTRAST TECHNIQUE: Multiplanar and multiecho pulse sequences of the cervical spine, to include the craniocervical junction and cervicothoracic junction, were obtained according to standard protocol without and with intravenous contrast. CONTRAST:  78mL MULTIHANCE GADOBENATE DIMEGLUMINE 529 MG/ML IV SOLN COMPARISON:  Preoperative MRI 12/16/2015. Intraoperative spot radiograph 01/16/2016. FINDINGS:  Alignment: Anatomic. Vertebrae: Susceptibility artifact related to C4-5 ACDF with plate and screw fixation. Some edema and enhancement of the C4 and C5 vertebral bodies, not unexpected given recent surgery. Cord: Cord compression at the C4-5 level, related to a contained fluid collection projecting posteriorly from the interspace. No definite abnormal cord signal. Canal AP diameter approximately 7 mm. Posterior Fossa: Unremarkable Vertebral Arteries: Flow voids are equal bilaterally. Paraspinal tissues: No significant fluid collection in the anterior neck. Disc levels: The individual disc spaces were examined as follows: C2-3: BILATERAL facet arthropathy with ligamentum flavum hypertrophy and infolding. Osseous spurring, worse on the LEFT, with mild foraminal narrowing. C3-4: Moderate stenosis related to short pedicles, posterior element hypertrophy, and osseous ridging. Suspected anterior spurring contributes to functional arthrodesis. No definite foraminal narrowing. C4-5: Contained fluid collection projects posteriorly from the interspace. Approximate cross-sectional measurements are 20 x 9 x 8 mm. Interbody cage appears to remain centered within the C4-5 disc space. Small amount of T2 hyperintense fluid projects slightly anterior and rightward to the cage, see image 15 series 6. Posterior margin of the fluid collection demonstrates postcontrast enhancement. Mild effect on the cord and C5 nerve roots, greater on the LEFT. Ligamentum flavum infolding contributes to stenosis, estimated 7 mm AP diameter. C5-6:  Annular bulge.  No impingement. C6-7:  Facet degenerative change, annular bulge.  No impingement. C7-T1:  Unremarkable. IMPRESSION: Abnormal fluid collection projecting dorsally from the C4-5 interspace, status post C4-5 ACDF on 01/16/2016. Mild peripheral enhancement. Mild cord flattening and effacement of both C5 nerve roots. No definite abnormal cord signal. Postoperative seroma is favored. Abscess not  excluded. Mild edema in the C4 and C5 vertebral bodies, not necessarily unexpected given recent surgery. Consider noncontrast CT of the cervical spine to assess for hardware integrity and/or cage subsidence. Electronically Signed   By: Staci Righter M.D.   On: 02/05/2016 11:23    Assessment/Plan: Diagnosis:  Cervical spondylitic myelopathy Labs and images independently reviewed.  Records reviewed and summated above.     Respiratory: encourage early use of incentive spirometry as tolerated,     assisted cough and deep breathing techniques. Chest physiotherapy if no     contraindications. May consider use of abdominal binder for better     diaphragmatic excursion.      Skin: daily skin checks, turn q2 (care with the spine), PRAFO, continue use pressure relieving mattress      Cardiovascular: anticipate orthostasis when OOB. May use abdominal     binder, TEDs or ace wraps to BLE for this. If ineffective, consider salt     tabs, midodrine or  fludrocortisone.       Psych: psychology consult for adjustment to disability for pt and family     Pain Management:  control with oral medications if possible     Bladder:  Possible development of neurogenic bladder as when spasticity starts to develop. May confirm this with serial PVRs to r/o retention/atonic bladder. In/out clean catherization. Implement bladder program . Encourage self I&O cath training vs     indwelling foley if possible to improve mobility, reduce infection, and     increase safety     Bowel: Stress ulcer ppx..  Implement mechanical and chemical bowel program and care     training with scheduled suppository 30 min to 1 hour after meals to utilize     gastrocolic and colorectal reflexes.    1. Does the need for close, 24 hr/day medical supervision in concert with the patient's rehab needs make it unreasonable for this patient to be served in a less intensive setting? Yes 2. Co-Morbidities requiring supervision/potential complications: diabetes  mellitus (Monitor in accordance with exercise and adjust meds as necessary), CAD status post CABG (cont meds), hx of CVA, pain management (Biofeedback training with therapies to help reduce reliance on opiate pain medications, monitor pain control during therapies, and sedation at rest and titrate to maximum efficacy to ensure participation and gains in therapies), Dysphagia (cont SLP, advance diet as tolerated), leukocytosis (cont to monitor for signs and symptoms of infection, further workup if indicated), tachypnea (monitor RR and O2 Sats with increased physical exertion) 3. Due to bladder management, bowel management, safety, skin/wound care, disease management, pain management and patient education, does the patient require 24 hr/day rehab nursing? Yes 4. Does the patient require coordinated care of a physician, rehab nurse, PT (1-2 hrs/day, 5 days/week), OT (1-2 hrs/day, 5 days/week) and SLP (1-2 hrs/day, 5 days/week) to address physical and functional deficits in the context of the above medical diagnosis(es)? Yes Addressing deficits in the following areas: balance, endurance, locomotion, strength, transferring, bowel/bladder control, bathing, dressing, feeding, toileting and psychosocial support 5. Can the patient actively participate in an intensive therapy program of at least 3 hrs of therapy per day at least 5 days per week? Potentially 6. The potential for patient to make measurable gains while on inpatient rehab is excellent 7. Anticipated functional outcomes upon discharge from inpatient rehab are supervision and min assist  with PT, supervision and min assist with OT, independent and modified independent with SLP. 8. Estimated rehab length of stay to reach the above functional goals is: 16-19 days. 9. Does the patient have adequate social supports and living environment to accommodate these discharge functional goals? Potentially 10. Anticipated D/C setting: Home 11. Anticipated post D/C  treatments: HH therapy and Home excercise program 12. Overall Rehab/Functional Prognosis: good  RECOMMENDATIONS: This patient's condition is appropriate for continued rehabilitative care in the following setting: Likely CIR, however, will need to reevaluate after surgery tomorrow to determine deficits and potential functional prognosis. Patient has agreed to participate in recommended program. Yes Note that insurance prior authorization may be required for reimbursement for recommended care.  Comment: Rehab Admissions Coordinator to follow up.  Delice Lesch, MD 02/07/2016

## 2016-02-07 NOTE — Progress Notes (Signed)
Physical Therapy Treatment Patient Details Name: Gary Craig MRN: [id number] DOB: 02/26/1934 Today's Date: 02/07/2016    History of Present Illness 82 year old male s/p ACDF C4-5 (01/16/16) due to stenosis with myelopathy 3 weeks ago admitted with increased pain and weakness. Initially brought to [facility] [place] by the time he arrived at [facility] on 02/05/16. Diagnosed with hematoma at the level of his recent fusion. S/p exploration of cervical fusion with removal of the plate and bone graft and evacuation of epidural hematoma (also on 02/05/16).  Patient with other significant PMHx of stroke, CABG, DM, and CAD.    PT Comments    Pt is motivated to get better, but may have an unrealistic expectation of the speed of his recovery (for some reason he has three days to get better post op in his head).  I reinforced that he needs to focus on improving every day and staying positive and working hard.  He was able to walk with assistance and an EVA walker (as his grip strength does not yet allow for a regular walker at this time).  He continues to be an excellent CIR candidate.  PT will try to see him in the AM before his surgery.    Follow Up Recommendations  CIR     Equipment Recommendations  Rolling walker with 5" wheels;3in1 (PT) (may need bil platforms depending on grip return)    Recommendations for Other Services   NA     Precautions / Restrictions Precautions Precautions: Fall;Cervical Required Braces or Orthoses: Cervical Brace Cervical Brace: Hard collar (order for both hard and soft collar, in ASPEN collar on eval) Restrictions Weight Bearing Restrictions: No    Mobility  Bed Mobility Overal bed mobility: Needs Assistance Bed Mobility: Rolling;Sidelying to Sit Rolling: Mod assist Sidelying to sit: Mod assist       General bed mobility comments: Mod assist to help pt flex knees to get into correct positon to log roll to the left.  Assist needed from sidelying to both progress  legs off of EOB and to support trunk to come up on left elbow and then push up to fully upright sitting.   Transfers Overall transfer level: Needs assistance Equipment used:  (EVA walker) Transfers: Sit to/from Stand Sit to Stand: +2 physical assistance;Mod assist         General transfer comment: Two person mod assist to stabilize knees and support trunk to power up to standing.  Verbal cues for safety (safe hand placement, wait until therapists are ready).   Ambulation/Gait Ambulation/Gait assistance: +2 physical assistance;Mod assist Ambulation Distance (Feet): 30 Feet (15'x2 with seated rest break) Assistive device:  (EVA walker) Gait Pattern/deviations: Step-through pattern;Steppage;Scissoring;Decreased dorsiflexion - right;Decreased step length - right Gait velocity: decreased Gait velocity interpretation: Below normal speed for age/gender General Gait Details: Pt weaker on the right with decreased ability to stabilize at right knee during stance (needs manual assist to help with this) and decreased ability to step forward without steppage gait pattern.  Pt with decreased awareness of buckling due to poor sensation in bil legs, so he pushes to walk more even after legs have fatigued.  Third person used to follow with chair, increasing our safety and allowing further gait.           Balance Overall balance assessment: Needs assistance Sitting-balance support: Feet supported;Bilateral upper extremity supported Sitting balance-Leahy Scale: Poor Sitting balance - Comments: min to mod assist in sitting due to poor trunk control, reliance on arms for  balance.  Postural control: Posterior lean Standing balance support: Bilateral upper extremity supported Standing balance-Leahy Scale: Poor                      Cognition Arousal/Alertness: Awake/alert Behavior During Therapy: Impulsive Overall Cognitive Status: Impaired/Different from baseline Area of Impairment:  Safety/judgement;Memory     Memory: Decreased short-term memory   Safety/Judgement: Decreased awareness of safety     General Comments: Pt asking me the same question several times during session, quick to move, decreased ability to feel his legs and decreased ability to know when it is safe to continue walking or not.     Exercises Total Joint Exercises Towel Squeeze: AROM;Both;10 reps Long Arc Quad: AROM;Both;10 reps General Exercises - Lower Extremity Ankle Circles/Pumps: AROM;AAROM;Both;20 reps Quad Sets: AROM;Both;10 reps Heel Slides: AAROM;Both;10 reps Straight Leg Raises: AAROM;Both;10 reps Hip Flexion/Marching: AROM;Both;10 reps;Seated        Pertinent Vitals/Pain Pain Assessment: No/denies pain Pain Score: 0-No pain           PT Goals (current goals can now be found in the care plan section) Acute Rehab PT Goals Patient Stated Goal: to have full return of function Progress towards PT goals: Progressing toward goals    Frequency  Min 5X/week    PT Plan Current plan remains appropriate       End of Session Equipment Utilized During Treatment: Gait belt;Cervical collar Activity Tolerance: Patient tolerated treatment well Patient left: in chair;with call bell/phone within reach;with chair alarm set     Time: [id number] PT Time Calculation (min) (ACUTE ONLY): 36 min  Charges:  $Gait Training: 8-22 mins $Therapeutic Exercise: 8-22 mins                     Justin Buechner B. [place], [place], DPT [phone]   02/07/2016, 1:03 PM

## 2016-02-07 NOTE — H&P (Signed)
Physical Medicine and Rehabilitation Admission H&P    Chief complaint: Weakness  HPI: Gary Craig is a 82 y.o. right handed male with history of diabetes mellitus, CAD status post CABG, CVA maintained on  Plavix. Per chart review patient lives with wife 2 level home with bedroom on main floor. 3 steps to entry. Independent prior to admission and active prior to initial ACDF. 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 [name] and discharged to home 01/16/2016. Patient developed neck pain and numbness sensation in the hands with progressive quadriparesis. MRI of the cervical spine showed abnormal fluid collection projecting dorsally from the C4-5 interspace. Mild peripheral enhancement. Mild cord flattening and effacement of both C5 nerve roots. Mild edema in the C4 and C5 vertebral bodies. Underwent exploration of C4-5 fusion. Removal of the plate and the bone graft. Evacuation of epidural hematoma. Insertion of a new allograft 9 mm height plate 02/06/2016 per Dr. [name] followed by posterior cervical decompression 02/08/2016 per Dr. [name]. [facility] course pain management. Cervical collar was placed. Decadron protocol as indicated. Currently on a dysphagia #1 thin liquid diet. Physical therapy evaluation completed 02/06/2016 with recommendations of physical medicine rehabilitation consult  ROS Constitutional: Negative for chills and fever.  HENT: Negative for hearing loss.   Eyes: Negative for blurred vision and double vision.  Respiratory: Negative for cough and shortness of breath.   Cardiovascular: Negative for palpitations and leg swelling.  Gastrointestinal: Positive for constipation. Negative for nausea and vomiting.       GERD  Genitourinary: Positive for urgency. Negative for dysuria and hematuria.  Musculoskeletal: Positive for neck pain.  Skin: Negative for rash.  Neurological: Positive for sensory  change, focal weakness and weakness. Negative for seizures and headaches.       Numbness upper extremities  All other systems reviewed and are negative   Past Medical History:  Diagnosis Date  . CAD (coronary artery disease)   . Carotid stenosis   . Diabetes mellitus without complication ([place])   . GERD (gastroesophageal reflux disease)    zantac as needed  . High cholesterol   . History of kidney stones   . Hypothyroidism   . S/P CABG (coronary artery bypass graft)   . Skin cancer   . Stroke [facility]) 2012   tia   Past Surgical History:  Procedure Laterality Date  . ANTERIOR CERVICAL DECOMP/DISCECTOMY FUSION N/A 01/16/2016   Procedure: Cervical Four-Five Anterior cervical decompression/diskectomy/fusion;  Surgeon: Kary Kos, MD;  Location: [facility];  Service: Neurosurgery;  Laterality: N/A;  right side approach  . CERVICAL WOUND DEBRIDEMENT N/A 02/05/2016   Procedure: Exploration of Cervical four - five wound. Removal of cervical four - five instrumentation. Insertion of new cervical four - five instrumentation.;  Surgeon: Leeroy Cha, MD;  Location: [facility];  Service: Neurosurgery;  Laterality: N/A;  Exploration of Cervical four - five wound. Removal of cervical four - five instrumentation. Insertion of new cervical four - five instrumentatio  . CORONARY ARTERY BYPASS GRAFT  11/29/2004  . EAR BIOPSY     skin ca  . LITHOTRIPSY Right   . POSTERIOR CERVICAL FUSION/FORAMINOTOMY N/A 02/08/2016   Procedure: CERVICAL FOUR-FIVE  Decompressive Laminectomy and Lateral Mass Fixation;  Surgeon: Kary Kos, MD;  Location: [facility];  Service: Neurosurgery;  Laterality: N/A;  . TONSILLECTOMY     Family History  Problem Relation Age of Onset  . CAD Brother   .  Heart block Brother    Social History:  reports that he quit smoking about 33 years ago. His smoking use included Cigarettes, Pipe, and Cigars. He smoked 1.00 pack per day. He has never used smokeless tobacco. He reports that he  drinks alcohol. He reports that he does not use drugs. Allergies:  Allergies  Allergen Reactions  . No Known Allergies    Medications Prior to Admission  Medication Sig Dispense Refill  . atorvastatin (LIPITOR) 40 MG tablet Take 1 tablet by mouth at bedtime.     . Glucosamine-Chondroitin 500-400 MG CAPS Take 1 tablet by mouth every morning.     Marland Kitchen levothyroxine (SYNTHROID, LEVOTHROID) 112 MCG tablet Take 112 mcg by mouth daily before breakfast.     . lisinopril (PRINIVIL,ZESTRIL) 2.5 MG tablet Take 2.5 mg by mouth every morning.     . metFORMIN (GLUCOPHAGE-XR) 500 MG 24 hr tablet Take 500 mg by mouth daily with breakfast.    . Multiple Vitamin (MULTIVITAMIN WITH MINERALS) TABS tablet Take 1 tablet by mouth every morning.     Marland Kitchen omega-3 acid ethyl esters (LOVAZA) 1 g capsule Take 1 g by mouth 2 (two) times daily.    Marland Kitchen oxyCODONE-acetaminophen (PERCOCET/ROXICET) 5-325 MG tablet Take 1 tablet by mouth every 6 (six) hours as needed for pain.    . ranitidine (ZANTAC) 75 MG tablet Take 75 mg by mouth daily as needed for heartburn.       Home: Home Living Family/patient expects to be discharged to:: Private residence Living Arrangements: Spouse/significant other Available Help at Discharge: Family, Available 24 hours/day Type of Home: House Home Access: Stairs to enter [employer] of Steps: 3 Entrance Stairs-Rails: Right Home Layout: Two level, Able to live on main level with bedroom/bathroom (his office is upstairs) Alternate Level Stairs-Number of Steps: flight Bathroom Shower/Tub: Chiropodist: Standard Home Equipment: Grab bars - tub/shower, Cane - single point   Functional History: Prior Function Level of Independence: Independent Comments: walk 5-6 miles Monday through friday for 40 years and play golf twice a week.   Functional Status:  Mobility: Bed Mobility Overal bed mobility: Needs Assistance Bed Mobility: Rolling, Sidelying to Sit Rolling:  Mod assist Sidelying to sit: +2 for physical assistance, Mod assist, HOB elevated Supine to sit: Max assist, HOB elevated General bed mobility comments: Assist needed to help flex bil legs to prepare for log roll, reach for left railing and support legs and trunk to get to sitting from side lying.   Transfers Overall transfer level: Needs assistance Equipment used: 2 person hand held assist Transfer via Lift Equipment:  (EVA stedy) Transfers: Sit to/from Stand, [employer] Transfers Sit to Stand: +2 physical assistance, Min assist Stand pivot transfers: +2 physical assistance, Min assist General transfer comment: Two person min assist to support trunk during transition to stand from elevated bed.  Knees blocked for safety, but no significant buckling noted even when side stepping towards chair.  Pt needs cues for safe slow descent into recliner using arms to help control speed.  Recliner chair built up to help him get out of it later with RN staff.  Ambulation/Gait Ambulation/Gait assistance: Mod assist, +2 physical assistance, +2 safety/equipment Ambulation Distance (Feet): 50 Feet Assistive device:  (EVA walker) Gait Pattern/deviations: Step-through pattern General Gait Details: NT today, but will likely be ready to progress to gait tomorrow.  Gait velocity: decreased Gait velocity interpretation: Below normal speed for age/gender    ADL: ADL Overall ADL's : Needs assistance/impaired Eating/Feeding:  Set up, Bed level, With adaptive utensils Eating/Feeding Details (indicate cue type and reason): Set up with use of red foam for self feeding. Pt able to grip coffee cup to take sips through straw. Grooming: Wash/dry face, Minimal assistance, Sitting Grooming Details (indicate cue type and reason): required cues to use R hand and not allow L hand to complete the task Upper Body Dressing : Moderate assistance, Sitting Upper Body Dressing Details (indicate cue type and reason): to don  hospital gown Toilet Transfer: Moderate assistance, +2 for physical assistance, Stand-pivot, BSC Toilet Transfer Details (indicate cue type and reason): Simulated by stand pivot from EOB to chair Functional mobility during ADLs: Moderate assistance, +2 for physical assistance (for stand pivot only) General ADL Comments: Pt very motivated to participate in therapy today. Demonstrating bil UE exercises to OT this session. Encouraged continued ROM and independence with functional activities.  Cognition: Cognition Overall Cognitive Status: Impaired/Different from baseline Orientation Level: Oriented X4 Cognition Arousal/Alertness: Awake/alert Behavior During Therapy: WFL for tasks assessed/performed Overall Cognitive Status: Impaired/Different from baseline Area of Impairment: Memory, Awareness Memory: Decreased short-term memory Safety/Judgement: Decreased awareness of safety, Decreased awareness of deficits Awareness: Emergent General Comments: Pt needs reinforcement of previously discussed precautions and physical progression expectations (he has some slightly unrealistic expectations of how quickly he will recover).  Physical Exam: Blood pressure (!) 142/81, pulse 62, temperature 98.4 F (36.9 C), temperature source Oral, resp. rate 17, height 5\' 8"  (1.727 m), weight 70.9 kg (156 lb 4.9 oz), SpO2 99 %. Physical Exam Constitutional: He is oriented to person, place, and time. He appears well-developed and well-nourished.  HENT:  Head: Normocephalic.  Right Ear: External ear normal.  Left Ear: External ear normal.  Eyes: Conjunctivae and EOM are normal.  Neck:  Cervical collar in place  Cardiovascular: Normal rate and regular rhythm.  no murmurs or rubs Respiratory: Effort normal and breath sounds normal. No respiratory distress. No wheezes or rhonchi GI: Soft. Bowel sounds are normal. He exhibits no distension. Non tender. Musculoskeletal: He exhibits no edema or tenderness.    Neurological: He is alert and oriented to person, place, and time.  Sensation diminished to light touch right hand and foot but slight DTRs symmetric Motor: RUE: 4/5 deltoid, bicep, tricep, wrist and HI 3+. RLE 3+ to 4-/5 HF, KE and 3+ ADF/PF. LUE: 4/5 prox to distal and LLE 4/5 prox to distal Decreased FMC RUE and RLE.   Skin: Skin is warm and dry.  Surgical site is dressed with drain in place  Psychiatric: He has a normal mood and affect. His behavior is normal   Results for orders placed or performed during the hospital encounter of 02/05/16 (from the past 48 hour(s))  Glucose, capillary     Status: Abnormal   Collection Time: 02/08/16  9:29 PM  Result Value Ref Range   Glucose-Capillary 247 (H) 65 - 99 mg/dL  Glucose, capillary     Status: Abnormal   Collection Time: 02/09/16  8:41 AM  Result Value Ref Range   Glucose-Capillary 174 (H) 65 - 99 mg/dL   Comment 1 Notify RN    Comment 2 Document in Chart   Glucose, capillary     Status: Abnormal   Collection Time: 02/09/16 11:47 AM  Result Value Ref Range   Glucose-Capillary 192 (H) 65 - 99 mg/dL   Comment 1 Notify RN    Comment 2 Document in Chart   Glucose, capillary     Status: Abnormal   Collection Time: 02/09/16  4:34 PM  Result Value Ref Range   Glucose-Capillary 181 (H) 65 - 99 mg/dL   Comment 1 Notify RN    Comment 2 Document in Chart   Glucose, capillary     Status: Abnormal   Collection Time: 02/09/16  9:28 PM  Result Value Ref Range   Glucose-Capillary 196 (H) 65 - 99 mg/dL  Glucose, capillary     Status: Abnormal   Collection Time: 02/10/16  7:59 AM  Result Value Ref Range   Glucose-Capillary 186 (H) 65 - 99 mg/dL  Glucose, capillary     Status: Abnormal   Collection Time: 02/10/16 12:27 PM  Result Value Ref Range   Glucose-Capillary 158 (H) 65 - 99 mg/dL   No results found.     Medical Problem List and Plan: 1. 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 Status post ACDF 01/16/2016  with postoperative epidural hematoma status post exploration and fusion removal of plate and bone graft 02/08/2016 followed by posterior cervical decompression 02/08/2016. Cervical collar at all times  -admit to inpatient rehab 2.  DVT Prophylaxis/Anticoagulation: SCDs. check vascular study 3. Pain Management: Tylenol as needed.   -denies any pain at present even at op site 4. Diabetes mellitus. Hemoglobin A1c 6.4. Glucophage 500 mg daily. Check blood sugars before meals and at bedtime 5. Neuropsych: This patient is capable of making decisions on his own behalf. 6. Skin/Wound Care: Routine skin checks 7. Fluids/Electrolytes/Nutrition: Routine I&O's with follow-up chemistry 8. Dysphagia. Dysphagia #1 thin liquid diet  -follow up per SLP 9. Hypertension. Lisinopril 2.5 mg daily. Monitor with increased mobility 10. Hypothyroidism. Continue Synthroid. 11. Hyperlipidemia. Lipitor   Post Admission Physician Evaluation: 1. Functional deficits secondary  to tetraplegia related to cervical myelopathy. 2. Patient is admitted to receive collaborative, interdisciplinary care between the physiatrist, rehab nursing staff, and therapy team. 3. Patient's level of medical complexity and substantial therapy needs in context of that medical necessity cannot be provided at a lesser intensity of care such as a SNF. 4. Patient has experienced substantial functional loss from his/her baseline which was documented above under the "Functional History" and "Functional Status" headings.  Judging by the patient's diagnosis, physical exam, and functional history, the patient has potential for functional progress which will result in measurable gains while on inpatient rehab.  These gains will be of substantial and practical use upon discharge  in facilitating mobility and self-care at the household level. 5. Physiatrist will provide 24 hour management of medical needs as well as oversight of the therapy plan/treatment and  provide guidance as appropriate regarding the interaction of the two. 6. 24 hour rehab nursing will assist with bladder management, bowel management, safety, skin/wound care, disease management, medication administration, pain management and patient education  and help integrate therapy concepts, techniques,education, etc. 7. PT will assess and treat for/with: Lower extremity strength, range of motion, stamina, balance, functional mobility, safety, adaptive techniques and equipment, NMR, community reintegration, fall-risk assessment, education.   Goals are: mod I. 8. OT will assess and treat for/with: ADL's, functional mobility, safety, upper extremity strength, adaptive techniques and equipment, NMR, education, community reintegration.   Goals are: mod I. Therapy may proceed with showering this patient. 9. SLP will assess and treat for/with: swallowing.  Goals are: mod I/advance diet. 10. Case Management and Social Worker will assess and treat for psychological issues and discharge planning. 11. Team conference will be held weekly to assess progress toward goals and to determine barriers to discharge. 12. Patient will receive at  least 3 hours of therapy per day at least 5 days per week. 13. ELOS: 11-16 days       14. Prognosis:  excellent     Meredith Staggers, MD, [place] 02/10/2016  02/10/2016

## 2016-02-08 ENCOUNTER — Encounter (HOSPITAL_COMMUNITY)
Admission: AD | Disposition: A | Discharge status: Rehabilitation Facility | Payer: Self-pay | Source: Other Acute Inpatient Hospital | Attending: Neurosurgery

## 2016-02-08 ENCOUNTER — Inpatient Hospital Stay (HOSPITAL_COMMUNITY): Payer: Medicare Other | Admitting: Certified Registered Nurse Anesthetist

## 2016-02-08 ENCOUNTER — Inpatient Hospital Stay (HOSPITAL_COMMUNITY): Payer: Medicare Other

## 2016-02-08 ENCOUNTER — Encounter (HOSPITAL_COMMUNITY): Payer: Self-pay | Admitting: Certified Registered Nurse Anesthetist

## 2016-02-08 DIAGNOSIS — M4802 Spinal stenosis, cervical region: Secondary | ICD-10-CM | POA: Diagnosis present

## 2016-02-08 HISTORY — PX: POSTERIOR CERVICAL FUSION/FORAMINOTOMY: SHX5038

## 2016-02-08 LAB — GLUCOSE, CAPILLARY
GLUCOSE-CAPILLARY: 166 mg/dL — AB (ref 65–99)
Glucose-Capillary: 175 mg/dL — ABNORMAL HIGH (ref 65–99)
Glucose-Capillary: 179 mg/dL — ABNORMAL HIGH (ref 65–99)
Glucose-Capillary: 247 mg/dL — ABNORMAL HIGH (ref 65–99)

## 2016-02-08 SURGERY — POSTERIOR CERVICAL FUSION/FORAMINOTOMY LEVEL 1
Anesthesia: General | Site: Neck

## 2016-02-08 MED ORDER — ACETAMINOPHEN 650 MG RE SUPP: 650.0000 mg | RECTAL | Status: DC | PRN

## 2016-02-08 MED ORDER — CEFAZOLIN SODIUM-DEXTROSE 2-4 GM/100ML-% IV SOLN
INTRAVENOUS | Status: AC
  Filled 2016-02-08: qty 100

## 2016-02-08 MED ORDER — DOCUSATE SODIUM 100 MG PO CAPS
100.0000 mg | ORAL_CAPSULE | Freq: Two times a day (BID) | ORAL | Status: DC
  Administered 2016-02-08 – 2016-02-09 (×2): 100 mg via ORAL
  Filled 2016-02-08 (×2): qty 1

## 2016-02-08 MED ORDER — ACETAMINOPHEN 325 MG PO TABS: 650.0000 mg | ORAL_TABLET | ORAL | Status: DC | PRN

## 2016-02-08 MED ORDER — ROCURONIUM BROMIDE 10 MG/ML (PF) SYRINGE
PREFILLED_SYRINGE | INTRAVENOUS | Status: AC
  Filled 2016-02-08: qty 10

## 2016-02-08 MED ORDER — VANCOMYCIN HCL 1000 MG IV SOLR
INTRAVENOUS | Status: AC
  Filled 2016-02-08: qty 1000

## 2016-02-08 MED ORDER — ONDANSETRON HCL 4 MG/2ML IJ SOLN
INTRAMUSCULAR | Status: AC
  Filled 2016-02-08: qty 2

## 2016-02-08 MED ORDER — DEXAMETHASONE 4 MG PO TABS: 4.0000 mg | ORAL_TABLET | Freq: Four times a day (QID) | ORAL | Status: DC

## 2016-02-08 MED ORDER — CYCLOBENZAPRINE HCL 10 MG PO TABS: 10.0000 mg | ORAL_TABLET | Freq: Three times a day (TID) | ORAL | Status: DC | PRN

## 2016-02-08 MED ORDER — CEFAZOLIN SODIUM-DEXTROSE 2-3 GM-% IV SOLR
INTRAVENOUS | Status: DC | PRN
  Administered 2016-02-08: 2 g via INTRAVENOUS

## 2016-02-08 MED ORDER — HYDROMORPHONE HCL 1 MG/ML IJ SOLN
INTRAMUSCULAR | Status: AC
  Filled 2016-02-08: qty 1

## 2016-02-08 MED ORDER — CEFAZOLIN SODIUM-DEXTROSE 2-4 GM/100ML-% IV SOLN
2.0000 g | Freq: Three times a day (TID) | INTRAVENOUS | Status: AC
  Administered 2016-02-08 – 2016-02-10 (×6): 2 g via INTRAVENOUS
  Filled 2016-02-08 (×6): qty 100

## 2016-02-08 MED ORDER — HYDROMORPHONE HCL 1 MG/ML IJ SOLN: 0.5000 mg | INTRAMUSCULAR | Status: DC | PRN

## 2016-02-08 MED ORDER — PHENOL 1.4 % MT LIQD: 1.0000 | OROMUCOSAL | Status: DC | PRN

## 2016-02-08 MED ORDER — LIDOCAINE 2% (20 MG/ML) 5 ML SYRINGE
INTRAMUSCULAR | Status: AC
  Filled 2016-02-08: qty 5

## 2016-02-08 MED ORDER — MENTHOL 3 MG MT LOZG: 1.0000 | LOZENGE | OROMUCOSAL | Status: DC | PRN

## 2016-02-08 MED ORDER — PHENYLEPHRINE HCL 10 MG/ML IJ SOLN
INTRAMUSCULAR | Status: DC | PRN
  Administered 2016-02-08 (×2): 120 ug via INTRAVENOUS

## 2016-02-08 MED ORDER — BACITRACIN ZINC 500 UNIT/GM EX OINT
TOPICAL_OINTMENT | CUTANEOUS | Status: DC | PRN
  Administered 2016-02-08: 1 via TOPICAL

## 2016-02-08 MED ORDER — BUPIVACAINE HCL (PF) 0.25 % IJ SOLN
INTRAMUSCULAR | Status: DC | PRN
  Administered 2016-02-08: 5 mL

## 2016-02-08 MED ORDER — GLYCOPYRROLATE 0.2 MG/ML IV SOSY
PREFILLED_SYRINGE | INTRAVENOUS | Status: AC
  Filled 2016-02-08: qty 3

## 2016-02-08 MED ORDER — FAMOTIDINE 20 MG PO TABS
40.0000 mg | ORAL_TABLET | Freq: Two times a day (BID) | ORAL | Status: DC
  Administered 2016-02-08 – 2016-02-10 (×4): 40 mg via ORAL
  Filled 2016-02-08 (×4): qty 2

## 2016-02-08 MED ORDER — BACITRACIN 50000 UNITS IM SOLR
INTRAMUSCULAR | Status: DC | PRN
  Administered 2016-02-08: 500 mL

## 2016-02-08 MED ORDER — LIDOCAINE-EPINEPHRINE 1 %-1:100000 IJ SOLN
INTRAMUSCULAR | Status: DC | PRN
  Administered 2016-02-08: 5 mL

## 2016-02-08 MED ORDER — 0.9 % SODIUM CHLORIDE (POUR BTL) OPTIME
TOPICAL | Status: DC | PRN
  Administered 2016-02-08: 1000 mL

## 2016-02-08 MED ORDER — DEXAMETHASONE SODIUM PHOSPHATE 4 MG/ML IJ SOLN: 4.0000 mg | Freq: Four times a day (QID) | INTRAMUSCULAR | Status: DC

## 2016-02-08 MED ORDER — LACTATED RINGERS IV SOLN
INTRAVENOUS | Status: DC | PRN
  Administered 2016-02-08 (×2): via INTRAVENOUS

## 2016-02-08 MED ORDER — SODIUM CHLORIDE 0.9% FLUSH
3.0000 mL | Freq: Two times a day (BID) | INTRAVENOUS | Status: DC
  Administered 2016-02-08 – 2016-02-09 (×2): 3 mL via INTRAVENOUS

## 2016-02-08 MED ORDER — NEOSTIGMINE METHYLSULFATE 5 MG/5ML IV SOSY
PREFILLED_SYRINGE | INTRAVENOUS | Status: AC
  Filled 2016-02-08: qty 5

## 2016-02-08 MED ORDER — HYDROMORPHONE HCL 1 MG/ML IJ SOLN
0.2500 mg | INTRAMUSCULAR | Status: DC | PRN
  Administered 2016-02-08 (×2): 0.5 mg via INTRAVENOUS

## 2016-02-08 MED ORDER — PROMETHAZINE HCL 25 MG/ML IJ SOLN: 6.2500 mg | INTRAMUSCULAR | Status: DC | PRN

## 2016-02-08 MED ORDER — PROPOFOL 10 MG/ML IV BOLUS
INTRAVENOUS | Status: AC
  Filled 2016-02-08: qty 20

## 2016-02-08 MED ORDER — SODIUM CHLORIDE 0.9% FLUSH: 3.0000 mL | INTRAVENOUS | Status: DC | PRN

## 2016-02-08 MED ORDER — PHENYLEPHRINE HCL 10 MG/ML IJ SOLN
INTRAVENOUS | Status: DC | PRN
  Administered 2016-02-08: 20 ug/min via INTRAVENOUS

## 2016-02-08 MED ORDER — PROPOFOL 10 MG/ML IV BOLUS
INTRAVENOUS | Status: DC | PRN
  Administered 2016-02-08: 150 mg via INTRAVENOUS

## 2016-02-08 MED ORDER — SUGAMMADEX SODIUM 200 MG/2ML IV SOLN
INTRAVENOUS | Status: DC | PRN
  Administered 2016-02-08: 150 mg via INTRAVENOUS

## 2016-02-08 MED ORDER — FENTANYL CITRATE (PF) 100 MCG/2ML IJ SOLN
INTRAMUSCULAR | Status: AC
Start: 2016-02-08 — End: 2016-02-08
  Filled 2016-02-08: qty 4

## 2016-02-08 MED ORDER — ONDANSETRON HCL 4 MG/2ML IJ SOLN: 4.0000 mg | INTRAMUSCULAR | Status: DC | PRN

## 2016-02-08 MED ORDER — LIDOCAINE HCL (CARDIAC) 20 MG/ML IV SOLN
INTRAVENOUS | Status: DC | PRN
  Administered 2016-02-08: 80 mg via INTRAVENOUS

## 2016-02-08 MED ORDER — FENTANYL CITRATE (PF) 100 MCG/2ML IJ SOLN
INTRAMUSCULAR | Status: DC | PRN
  Administered 2016-02-08 (×2): 50 ug via INTRAVENOUS

## 2016-02-08 MED ORDER — ROCURONIUM BROMIDE 100 MG/10ML IV SOLN
INTRAVENOUS | Status: DC | PRN
  Administered 2016-02-08: 20 mg via INTRAVENOUS
  Administered 2016-02-08: 50 mg via INTRAVENOUS

## 2016-02-08 MED ORDER — OXYCODONE-ACETAMINOPHEN 5-325 MG PO TABS
1.0000 | ORAL_TABLET | ORAL | Status: DC | PRN
  Administered 2016-02-08 – 2016-02-09 (×2): 1 via ORAL
  Filled 2016-02-08 (×2): qty 1

## 2016-02-08 MED ORDER — DEXAMETHASONE SODIUM PHOSPHATE 10 MG/ML IJ SOLN
INTRAMUSCULAR | Status: DC | PRN
  Administered 2016-02-08: 10 mg via INTRAVENOUS

## 2016-02-08 MED ORDER — ARTIFICIAL TEARS OP OINT
TOPICAL_OINTMENT | OPHTHALMIC | Status: AC
  Filled 2016-02-08: qty 3.5

## 2016-02-08 MED ORDER — SENNOSIDES-DOCUSATE SODIUM 8.6-50 MG PO TABS: 1.0000 | ORAL_TABLET | Freq: Every evening | ORAL | Status: DC | PRN

## 2016-02-08 MED ORDER — SODIUM CHLORIDE 0.9 % IV SOLN: 250.0000 mL | INTRAVENOUS | Status: DC

## 2016-02-08 MED ORDER — THROMBIN 20000 UNITS EX SOLR
CUTANEOUS | Status: DC | PRN
  Administered 2016-02-08: 20 mL via TOPICAL

## 2016-02-08 SURGICAL SUPPLY — 65 items
BAG DECANTER FOR FLEXI CONT (MISCELLANEOUS) ×3 IMPLANT
BENZOIN TINCTURE PRP APPL 2/3 (GAUZE/BANDAGES/DRESSINGS) ×6 IMPLANT
BIT DRILL 3.5 SHORT (BIT) ×2 IMPLANT
BIT DRILL 3.5MM SHORT (BIT) ×1
BLADE CLIPPER SURG (BLADE) ×3 IMPLANT
BLADE SURG 11 STRL SS (BLADE) ×3 IMPLANT
BUR MATCHSTICK NEURO 3.0 LAGG (BURR) ×3 IMPLANT
CANISTER SUCT 3000ML PPV (MISCELLANEOUS) ×3 IMPLANT
CAP LOCKING (Cap) ×4 IMPLANT
CLOSURE WOUND 1/2 X4 (GAUZE/BANDAGES/DRESSINGS) ×1
DECANTER SPIKE VIAL GLASS SM (MISCELLANEOUS) ×3 IMPLANT
DRAPE C-ARM 42X72 X-RAY (DRAPES) IMPLANT
DRAPE LAPAROTOMY 100X72 PEDS (DRAPES) ×3 IMPLANT
DRAPE MICROSCOPE LEICA (MISCELLANEOUS) IMPLANT
DRAPE POUCH INSTRU U-SHP 10X18 (DRAPES) ×3 IMPLANT
DRAPE SURG 17X23 STRL (DRAPES) ×12 IMPLANT
DRSG OPSITE 4X5.5 SM (GAUZE/BANDAGES/DRESSINGS) ×3 IMPLANT
DRSG OPSITE POSTOP 4X6 (GAUZE/BANDAGES/DRESSINGS) ×3 IMPLANT
DURAPREP 26ML APPLICATOR (WOUND CARE) ×3 IMPLANT
ELECT REM PT RETURN 9FT ADLT (ELECTROSURGICAL) ×3
ELECTRODE REM PT RTRN 9FT ADLT (ELECTROSURGICAL) ×1 IMPLANT
EVACUATOR 1/8 PVC DRAIN (DRAIN) ×3 IMPLANT
GAUZE SPONGE 4X4 12PLY STRL (GAUZE/BANDAGES/DRESSINGS) ×3 IMPLANT
GAUZE SPONGE 4X4 16PLY XRAY LF (GAUZE/BANDAGES/DRESSINGS) IMPLANT
GLOVE BIO SURGEON STRL SZ8 (GLOVE) ×3 IMPLANT
GLOVE EXAM NITRILE LRG STRL (GLOVE) IMPLANT
GLOVE EXAM NITRILE MD LF STRL (GLOVE) IMPLANT
GLOVE EXAM NITRILE XL STR (GLOVE) IMPLANT
GLOVE EXAM NITRILE XS STR PU (GLOVE) IMPLANT
GLOVE INDICATOR 8.5 STRL (GLOVE) ×3 IMPLANT
GOWN STRL REUS W/ TWL LRG LVL3 (GOWN DISPOSABLE) IMPLANT
GOWN STRL REUS W/ TWL XL LVL3 (GOWN DISPOSABLE) ×1 IMPLANT
GOWN STRL REUS W/TWL 2XL LVL3 (GOWN DISPOSABLE) IMPLANT
GOWN STRL REUS W/TWL LRG LVL3 (GOWN DISPOSABLE)
GOWN STRL REUS W/TWL XL LVL3 (GOWN DISPOSABLE) ×2
IMPL QUARTEX 3.5X12MM (Neuro Prosthesis/Implant) ×4 IMPLANT
IMPLANT QUARTEX 3.5X12MM (Neuro Prosthesis/Implant) ×12 IMPLANT
KIT BASIN OR (CUSTOM PROCEDURE TRAY) ×3 IMPLANT
KIT ROOM TURNOVER OR (KITS) ×3 IMPLANT
LIQUID BAND (GAUZE/BANDAGES/DRESSINGS) ×3 IMPLANT
LOCKING CAP (Cap) ×12 IMPLANT
MARKER SKIN DUAL TIP RULER LAB (MISCELLANEOUS) ×3 IMPLANT
NEEDLE HYPO 25X1 1.5 SAFETY (NEEDLE) ×3 IMPLANT
NEEDLE SPNL 20GX3.5 QUINCKE YW (NEEDLE) ×3 IMPLANT
NS IRRIG 1000ML POUR BTL (IV SOLUTION) ×3 IMPLANT
PACK LAMINECTOMY NEURO (CUSTOM PROCEDURE TRAY) ×3 IMPLANT
PAD ARMBOARD 7.5X6 YLW CONV (MISCELLANEOUS) ×9 IMPLANT
PATTIES SURGICAL .5 X.5 (GAUZE/BANDAGES/DRESSINGS) IMPLANT
PIN MAYFIELD SKULL DISP (PIN) ×3 IMPLANT
PUTTY BONE DBX 2.5 MIS (Bone Implant) ×3 IMPLANT
ROD SPINE POST 3.5X80 (Rod) ×3 IMPLANT
RUBBERBAND STERILE (MISCELLANEOUS) IMPLANT
SLEEVE SURGEON STRL (DRAPES) ×3 IMPLANT
SPONGE LAP 4X18 X RAY DECT (DISPOSABLE) IMPLANT
SPONGE SURGIFOAM ABS GEL 100 (HEMOSTASIS) ×3 IMPLANT
STRIP CLOSURE SKIN 1/2X4 (GAUZE/BANDAGES/DRESSINGS) ×2 IMPLANT
SUT ETHILON 4 0 PS 2 18 (SUTURE) IMPLANT
SUT VIC AB 0 CT1 18XCR BRD8 (SUTURE) ×1 IMPLANT
SUT VIC AB 0 CT1 8-18 (SUTURE) ×2
SUT VIC AB 2-0 CT1 18 (SUTURE) ×3 IMPLANT
SUT VIC AB 4-0 PS2 27 (SUTURE) ×3 IMPLANT
TOWEL OR 17X24 6PK STRL BLUE (TOWEL DISPOSABLE) ×3 IMPLANT
TOWEL OR 17X26 10 PK STRL BLUE (TOWEL DISPOSABLE) ×3 IMPLANT
TRAY FOLEY W/METER SILVER 16FR (SET/KITS/TRAYS/PACK) IMPLANT
WATER STERILE IRR 1000ML POUR (IV SOLUTION) ×3 IMPLANT

## 2016-02-08 NOTE — Progress Notes (Signed)
Rehab admissions - Please see rehab consult done by Dr. Posey Pronto yesterday.  Noted patient scheduled for surgery today.  I will follow up tomorrow.  Likely will need inpatient rehab admission once medically ready.  Call me for questions.  [phone]

## 2016-02-08 NOTE — Progress Notes (Signed)
Patient ID: Gary Craig, male   DOB: [birth year], 82 y.o.   MRN: [id number] I've extensively gone over the risks and benefits of posterior cervical decompressive laminectomy and fusion at C4-5 with the patient as well as perioperative course expectations of outcome and alternatives of surgery he understands and agrees to proceed forward.

## 2016-02-08 NOTE — Anesthesia Postprocedure Evaluation (Signed)
Anesthesia Post Note  Patient: Gary Craig  Procedure(s) Performed: Procedure(s) (LRB): CERVICAL FOUR-FIVE  Decompressive Laminectomy and Lateral Mass Fixation (N/A)  Patient location during evaluation: PACU Anesthesia Type: General Level of consciousness: sedated and patient cooperative Pain management: pain level controlled Vital Signs Assessment: post-procedure vital signs reviewed and stable Respiratory status: spontaneous breathing Cardiovascular status: stable Anesthetic complications: no    Last Vitals:  Vitals:   02/08/16 2030 02/08/16 2100  BP: (!) 177/77 (!) 152/71  Pulse: 71 63  Resp: 17 14  Temp:      Last Pain:  Vitals:   02/08/16 2118  TempSrc:   PainSc: [place]

## 2016-02-08 NOTE — Progress Notes (Signed)
Occupational Therapy Treatment Patient Details Name: Gary Craig MRN: [id number] DOB: 1933/10/13 Today's Date: 02/08/2016    History of present illness 82 year old male s/p ACDF C4-5 (01/16/16) due to stenosis with myelopathy 3 weeks ago admitted with increased pain and weakness. Initially brought to [facility], [place] by the time he arrived at [facility] on 02/05/16. Diagnosed with hematoma at the level of his recent fusion. S/p exploration of cervical fusion with removal of the plate and bone graft and evacuation of epidural hematoma (also on 02/05/16).  Patient with other significant PMHx of stroke, CABG, DM, and CAD.   OT comments  Pt very eager to progress with therapy and very motivated to know when next session will occur. Pt encouraged to take time to relax and rest after surgery and not spend long periods of time in chair. Pt needed verbal cues from wife during session to focus attention. Wife states "they are trying to tell you just stop and listen to them. "    Follow Up Recommendations  CIR;Supervision/Assistance - 24 hour    Equipment Recommendations  3 in 1 bedside comode;Wheelchair (measurements OT);Wheelchair cushion (measurements OT)    Recommendations for Other Services Rehab consult    Precautions / Restrictions Precautions Precautions: Fall;Cervical Precaution Comments: reviewed cervical precautions Required Braces or Orthoses: Cervical Brace Cervical Brace: Hard collar Restrictions Weight Bearing Restrictions: No       Mobility Bed Mobility Overal bed mobility: Needs Assistance Bed Mobility: Rolling;Sidelying to Sit Rolling: Min assist Sidelying to sit: Mod assist Supine to sit: Max assist;HOB elevated     General bed mobility comments: minimal use of hands to assist with rolling, was able to push up on elbow, modA for turnk elevation  Transfers Overall transfer level: Needs assistance   Transfers: Sit to/from Stand Sit to Stand: Mod assist;+2 physical  assistance;+2 safety/equipment         General transfer comment: pt able to power up into standing but up able to sustain due to R LE weakness and shifting to the L side. pt attempting to advance to gait and needed cues to just stand     Balance Overall balance assessment: Needs assistance Sitting-balance support: Bilateral upper extremity supported Sitting balance-Leahy Scale: Poor Sitting balance - Comments: unable to find midline and maintain   Standing balance support: Bilateral upper extremity supported Standing balance-Leahy Scale: Poor Standing balance comment: requires bilat forearm support                   ADL Overall ADL's : Needs assistance/impaired Eating/Feeding: NPO Eating/Feeding Details (indicate cue type and reason): due to surgery today  Grooming: Wash/dry face;Minimal assistance;Sitting Grooming Details (indicate cue type and reason): required cues to use R hand and not allow L hand to complete the task                   Toilet Transfer Details (indicate cue type and reason): simulated OOB to chair see below         Functional mobility during ADLs: +2 for physical assistance;+2 for safety/equipment (total +3 EVA walker with (A) to block R LE )        Vision                     Perception     Praxis      Cognition   Behavior During Therapy: Impulsive Overall Cognitive Status: Impaired/Different from baseline Area of Impairment: Awareness;Safety/judgement  Safety/Judgement: Decreased awareness of deficits;Decreased awareness of safety Awareness: Emergent   General Comments: max v/c's to slow down and wait for therapist prior to moving, pt with decreased safety awaress and deficits     Extremity/Trunk Assessment               Exercises Other Exercises Other Exercises: digit flexion extension, abduction/ adduction and wrist extension and thumb circumduction   Shoulder Instructions       General  Comments      Pertinent Vitals/ Pain       Pain Assessment: No/denies pain Pain Score: 0-No pain  Home Living                                          Prior Functioning/Environment              Frequency Min 3X/week     Progress Toward Goals  OT Goals(current goals can now be found in the care plan section)  Progress towards OT goals: Progressing toward goals  Acute Rehab OT Goals Patient Stated Goal: to have full return of function OT Goal Formulation: With patient Time For Goal Achievement: 02/20/16 Potential to Achieve Goals: Good ADL Goals Pt Will Transfer to Toilet: with min assist;bedside commode;stand pivot transfer Additional ADL Goal #1: Pt will complete bed moblity mod (A) with bed rails as needed Additional ADL Goal #2: Pt will complete oral care at min (A) level  Additional ADL Goal #3: Pt will complete R and L UE HEP with MIN (A) from staff   Plan Discharge plan remains appropriate    Co-evaluation      Reason for Co-Treatment: Complexity of the patient's impairments (multi-system involvement);Necessary to address cognition/behavior during functional activity;For patient/therapist safety PT goals addressed during session: Mobility/safety with mobility        End of Session Equipment Utilized During Treatment: Cervical collar;Gait belt   Activity Tolerance Patient tolerated treatment well   Patient Left in chair;with call bell/phone within reach   Nurse Communication Mobility status;Precautions        Time: 1031-1105 OT Time Calculation (min): 34 min  Charges: OT General Charges $OT Visit: 1 Procedure OT Treatments $Therapeutic Activity: 8-22 mins  Parke Poisson B 02/08/2016, 1:33 PM  Jeri Modena   OTR/L Pager: [phone] Office: [phone] .

## 2016-02-08 NOTE — Progress Notes (Signed)
Patient ID: Gary Craig, male   DOB: [birth year], 82 y.o.   MRN: [id number] Patient feeling better increased dexterity mobility of his hands and fingers still has some weakness in his hands and right foot.  Strength 5 out of 5 proximally. Extremities 4 out of 5 hand intrinsics  2 OR today for posterior cervical decompression and fusion.

## 2016-02-08 NOTE — Transfer of Care (Signed)
Immediate Anesthesia Transfer of Care Note  Patient: Gary Craig  Procedure(s) Performed: Procedure(s): CERVICAL FOUR-FIVE  Decompressive Laminectomy and Lateral Mass Fixation (N/A)  Patient Location: PACU  Anesthesia Type:General  Level of Consciousness: awake, alert , oriented and patient cooperative  Airway & Oxygen Therapy: Patient Spontanous Breathing and Patient connected to nasal cannula oxygen  Post-op Assessment: Report given to RN and Post -op Vital signs reviewed and stable moving all extremities but right side is weaker than left; dr [name] at bs and aware  Post vital signs: Reviewed and stable  Last Vitals:  Vitals:   02/08/16 0500 02/08/16 0849  BP: (!) 154/85 (!) 152/69  Pulse: 80 60  Resp: 18   Temp: 36.6 C 36.6 C    Last Pain:  Vitals:   02/08/16 0849  TempSrc: Oral  PainSc: 0-No pain      Patients Stated Pain Goal: 2 ([id number])  Complications: No apparent anesthesia complications

## 2016-02-08 NOTE — Progress Notes (Signed)
Physical Therapy Treatment Patient Details Name: Gary Craig MRN: [id number] DOB: Apr 10, 1934 Today's Date: 02/08/2016    History of Present Illness 82 year old male s/p ACDF C4-5 (01/16/16) due to stenosis with myelopathy 3 weeks ago admitted with increased pain and weakness. Initially brought to [facility] [place] by the time he arrived at [facility] on 02/05/16. Diagnosed with hematoma at the level of his recent fusion. S/p exploration of cervical fusion with removal of the plate and bone graft and evacuation of epidural hematoma (also on 02/05/16).  Patient with other significant PMHx of stroke, CABG, DM, and CAD.    PT Comments    Pt con't to remain very motivated to return to PLOF, pt was planning on going to surgery today for posterior fusion. Pt tolerated 50' of ambulation but required modAx3 for optimal gait pattern and posture and walker control. PT to re-assess patient after surgery as appropriate/able. As of now pt remains an excellent candidate for CIR upon d/c.  Follow Up Recommendations  CIR     Equipment Recommendations  Rolling walker with 5" wheels;3in1 (PT)    Recommendations for Other Services Rehab consult     Precautions / Restrictions Precautions Precautions: Fall;Cervical Precaution Comments: reviewed cervical precautions Required Braces or Orthoses: Cervical Brace Cervical Brace: Hard collar Restrictions Weight Bearing Restrictions: No    Mobility  Bed Mobility Overal bed mobility: Needs Assistance Bed Mobility: Rolling;Sidelying to Sit Rolling: Min assist Sidelying to sit: Mod assist Supine to sit: Max assist;HOB elevated     General bed mobility comments: minimal use of hands to assist with rolling, was able to push up on elbow, modA for turnk elevation  Transfers Overall transfer level: Needs assistance   Transfers: Sit to/from Stand Sit to Stand: Mod assist;+2 physical assistance;+2 safety/equipment         General transfer comment: pt able to  power up into standing but up able to sustain due to R LE weakness and shifting to the L side. pt attempting to advance to gait and needed cues to just stand   Ambulation/Gait Ambulation/Gait assistance: Mod assist;+2 physical assistance;+2 safety/equipment Ambulation Distance (Feet): 50 Feet   Gait Pattern/deviations: Step-through pattern Gait velocity: decreased Gait velocity interpretation: Below normal speed for age/gender General Gait Details: 1 person on hips to provide proximal stability, 1 person with chair follow, 1 person to assist with EVA walker, and PT at R LE to provide tacticle cuing for optimal stepping pattern. pt with ataxic gait pattern, with R LE crossing over L LE, narrow base of support   Stairs            Wheelchair Mobility    Modified Rankin (Stroke Patients Only)       Balance Overall balance assessment: Needs assistance Sitting-balance support: Bilateral upper extremity supported Sitting balance-Leahy Scale: Poor Sitting balance - Comments: unable to find midline and maintain   Standing balance support: Bilateral upper extremity supported Standing balance-Leahy Scale: Poor Standing balance comment: requires bilat forearm support                    Cognition Arousal/Alertness: Awake/alert Behavior During Therapy: Impulsive Overall Cognitive Status: Impaired/Different from baseline Area of Impairment: Awareness;Safety/judgement         Safety/Judgement: Decreased awareness of deficits;Decreased awareness of safety Awareness: Emergent   General Comments: max v/c's to slow down and wait for therapist prior to moving, pt with decreased safety awaress and deficits     Exercises      General  Comments        Pertinent Vitals/Pain Pain Assessment: No/denies pain Pain Score: 0-No pain    Home Living                      Prior Function            PT Goals (current goals can now be found in the care plan section)  Progress towards PT goals: Progressing toward goals    Frequency  Min 5X/week    PT Plan Current plan remains appropriate    Co-evaluation PT/OT/SLP Co-Evaluation/Treatment: Yes Reason for Co-Treatment: Complexity of the patient's impairments (multi-system involvement);Necessary to address cognition/behavior during functional activity;For patient/therapist safety PT goals addressed during session: Mobility/safety with mobility       End of Session Equipment Utilized During Treatment: Gait belt;Cervical collar Activity Tolerance: Patient tolerated treatment well Patient left: in chair;with call bell/phone within reach;with chair alarm set (with OT)     Time: [id number] PT Time Calculation (min) (ACUTE ONLY): 26 min  Charges:  $Gait Training: 8-22 mins                    G Codes:      Kingsley Callander 02/08/2016, 1:21 PM   Kittie Plater, PT, DPT Pager #: [phone] Office #: [phone]

## 2016-02-08 NOTE — Op Note (Signed)
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  EBL: Minimal  History of present illness: Patient is a very pleasant 82 year old woman who approximately 3 weeks ago underwent anterior cervical discectomy fusion at C4-5 and was doing very well was followed and seen for his first postoperative appointment was making good progress started developing some worsening neck pain shortly thereafter was placed on a steroid pack to which she got significantly better but towards the tail end of the steroids and neck pain started getting worse. He is experiencing a little bit numbness in his left side of his neck and shoulder went to the emergency room. In the emergency room apparently he was getting ready for an MRI scan no putting an IV in his arm and adjusted the head of his bed rapidly and experienced immediate sudden onset of neck pain and rapid quadraparesis. MRI scan showed an epidural hematoma and stenosis from the fluid collection behind the bone graft. Patient was transferred [facility] emergently taken to the OR underwent anterior cervical fusion revision for evacuation of hematoma. Postoperatively from this patient noted significant improvement significant increase strength in upper and lower extremities had some residual weakness in his hand intrinsics but was making progress. But due to his known posterior compressive disease, I recommended posterior cervical decompression to allow a spinal cord even more room during the healing process. So therefore we took the patient back to the OR for posterior decompression and fusion. I  extensively went over the risks and benefits of this procedure the patient as well as perioperative course expectations of outcome and alternatives of surgery and he understood and agreed to proceed forward.  Operative procedure: Patient was brought in the or was induced on general anesthesia positioned prone in pins with a collar in place. After the patient was locked in place in pins a collar was removed and the backside of his neck was prepped and draped in routine sterile fashion after infiltration of 10 mL lidocaine with epi a midline incision was made and Bovie light cautery was used to 10 subcutaneous tissues and subperiosteal dissections care lamina of C4 and C5 bilaterally. Interoperative x-ray identified the appropriate level. The right-sided C4-5 facet was noted markedly diastased with ankylosis at the 34 facet and on the left at 45. So pilot holes were drilled in the inferomedial quadrant and lateral mass holes were drilled projecting out the superolateral quadrant. These also probed tapped near cortex and 12 mm screws were placed. All screws excellent purchase. At this point I removed the spinous process at C4 sent decompression was begun there was a large spur coming off the left side of the facet joint causing severe compression of the thecal sac and spinal cord on that side this was removed very carefully with a 1 mm Kerrison punch marching along the gutter. At the end of the decompression there was no further stenosis. Posterior fluoroscopy confirmed good position of the implants and adequate decompression. Then attached the rods and tightened down the nuts placed a medium Hemovac drain prior to attaching the rods I aggressively decorticated lateral masses and facet joints and packed the locally harvested autograft mixed with DBX in the facet joint and along the lateral  masses. Then placed a Hemovac drain and closed with layers with after Vicryl running 4 subcuticular Dermabond benzo and  Steri-Strips and sterile dressing. At the end of case all needle counts and sponge counts were correct.

## 2016-02-08 NOTE — Anesthesia Procedure Notes (Signed)
Procedure Name: Intubation Date/Time: 02/08/2016 2:54 PM Performed by: Willeen Cass P Pre-anesthesia Checklist: Patient identified, Emergency Drugs available, Suction available, Patient being monitored and Timeout performed Patient Re-evaluated:Patient Re-evaluated prior to inductionOxygen Delivery Method: Circle system utilized Preoxygenation: Pre-oxygenation with 100% oxygen Intubation Type: IV induction and Rapid sequence Laryngoscope size: Large Adult Glidescope. Grade View: Grade I Tube type: Oral Tube size: 7.0 mm Number of attempts: 1 Airway Equipment and Method: Video-laryngoscopy and Rigid stylet Placement Confirmation: ETT inserted through vocal cords under direct vision,  positive ETCO2 and breath sounds checked- equal and bilateral Secured at: 23 cm Tube secured with: Tape Dental Injury: Teeth and Oropharynx as per pre-operative assessment  Difficulty Due To: Difficulty was anticipated, Difficult Airway- due to cervical collar and Difficult Airway-  due to neck instability

## 2016-02-08 NOTE — Care Management Note (Signed)
Case Management Note  Patient Details  Name: DHRUVAN SATTERLY MRN: [id number] Date of Birth: September 20, 1933  Subjective/Objective:   Pt underwent: Exploration of Cervical four - five wound. Removal of cervical four - five instrumentation. Insertion of new cervical four - five instrumentation. Pt to have PCDF today. He is from home with his spouse.                 Action/Plan: OR today and then recommendations are for CIR. CM following for discharge disposition.   Expected Discharge Date:                  Expected Discharge Plan:     In-House Referral:     Discharge planning Services     Post Acute Care Choice:    Choice offered to:     DME Arranged:    DME Agency:     HH Arranged:    HH Agency:     Status of Service:  In process, will continue to follow  If discussed at Long Length of Stay Meetings, dates discussed:    Additional Comments:  Pollie Friar, RN 02/08/2016, 11:30 AM

## 2016-02-08 NOTE — Anesthesia Preprocedure Evaluation (Addendum)
Anesthesia Evaluation  Patient identified by MRN, date of birth, ID band Patient awake    Reviewed: Allergy & Precautions, H&P , NPO status , Patient's Chart, lab work & pertinent test results  Airway Mallampati: II  TM Distance: >3 FB Neck ROM: Limited    Dental no notable dental hx. (+) Teeth Intact, Dental Advisory Given   Pulmonary former smoker,    Pulmonary exam normal        Cardiovascular Exercise Tolerance: Good hypertension, Pt. on medications + CAD, + CABG and + Peripheral Vascular Disease  Normal cardiovascular exam Rhythm:Regular Rate:Normal     Neuro/Psych 2016 carotid dopplers are ok CVA negative psych ROS   GI/Hepatic Neg liver ROS, GERD  Medicated and Controlled,  Endo/Other  negative endocrine ROSdiabetes, Type 2, Oral Hypoglycemic AgentsHypothyroidism   Renal/GU negative Renal ROS  negative genitourinary   Musculoskeletal  (+) Arthritis , Osteoarthritis,    Abdominal Normal abdominal exam  (+)   Peds negative pediatric ROS (+)  Hematology negative hematology ROS (+)   Anesthesia Other Findings   Reproductive/Obstetrics negative OB ROS                            Anesthesia Physical Anesthesia Plan  ASA: III  Anesthesia Plan: General   Post-op Pain Management:    Induction: Intravenous  Airway Management Planned: Video Laryngoscope Planned and Oral ETT  Additional Equipment:   Intra-op Plan:   Post-operative Plan: Extubation in OR and Possible Post-op intubation/ventilation  Informed Consent: I have reviewed the patients History and Physical, chart, labs and discussed the procedure including the risks, benefits and alternatives for the proposed anesthesia with the patient or authorized representative who has indicated his/her understanding and acceptance.   Dental advisory given  Plan Discussed with: CRNA  Anesthesia Plan Comments:        Anesthesia  Quick Evaluation

## 2016-02-09 ENCOUNTER — Encounter (HOSPITAL_COMMUNITY): Payer: Self-pay | Admitting: Neurosurgery

## 2016-02-09 LAB — GLUCOSE, CAPILLARY
GLUCOSE-CAPILLARY: 181 mg/dL — AB (ref 65–99)
Glucose-Capillary: 174 mg/dL — ABNORMAL HIGH (ref 65–99)
Glucose-Capillary: 192 mg/dL — ABNORMAL HIGH (ref 65–99)
Glucose-Capillary: 196 mg/dL — ABNORMAL HIGH (ref 65–99)

## 2016-02-09 MED ORDER — DOCUSATE SODIUM 50 MG/5ML PO LIQD
100.0000 mg | Freq: Two times a day (BID) | ORAL | Status: DC
  Administered 2016-02-09 – 2016-02-10 (×2): 100 mg via ORAL
  Filled 2016-02-09 (×2): qty 10

## 2016-02-09 NOTE — Progress Notes (Signed)
Inpatient Diabetes Program Recommendations  AACE/ADA: New Consensus Statement on Inpatient Glycemic Control (2015)  Target Ranges:  Prepandial:   less than 140 mg/dL      Peak postprandial:   less than 180 mg/dL (1-2 hours)      Critically ill patients:  140 - 180 mg/dL   Results for Gary Craig, Gary Craig (MRN [id number]) as of 02/09/2016 08:28  Ref. Range 02/08/2016 06:26 02/08/2016 11:45 02/08/2016 17:16 02/08/2016 21:29  Glucose-Capillary Latest Ref Range: 65 - 99 mg/dL 175 (H) 179 (H) 166 (H) 247 (H)    Home DM Meds: Metformin 500 mg daily  Current Insulin Orders: Metformin 500 mg daily      Novolog Sensitive Correction Scale/ SSI (0-9 units) TID AC + HS       MD- Please consider increasing Novolog SSI to Moderate scale (0-15 units) TID AC + HS (currently ordered as Sensitive scale 0-9 units) while patient receiving Decadron      --Will follow patient during hospitalization--  Wyn Quaker RN, MSN, CDE Diabetes Coordinator Inpatient Glycemic Control Team Team Pager: [phone] (8a-5p)

## 2016-02-09 NOTE — Progress Notes (Signed)
Subjective: Patient reports Significant improvement in upper extremities pain numbness and tingling as well as strength.  Objective: Vital signs in last 24 hours: Temp:  [97.3 F (36.3 C)-98.1 F (36.7 C)] 97.5 F (36.4 C) (08/24 0400) Pulse Rate:  [50-77] 50 (08/24 0600) Resp:  [10-32] 11 (08/24 0600) BP: (130-183)/(66-91) 165/66 (08/24 0600) SpO2:  [96 %-100 %] 100 % (08/24 0600) Weight:  [70.9 kg (156 lb 4.9 oz)] 70.9 kg (156 lb 4.9 oz) (08/24 0500)  Intake/Output from previous day: 08/23 0701 - 08/24 0700 In: 2115 [P.O.:240; I.V.:1675; IV Piggyback:200] Out: [id number] [Urine:1500; Drains:89; Blood:100] Intake/Output this shift: No intake/output data recorded.  44+ strength and hand intrinsics 4+ strength lower extremities incisions clean dry and intact  Lab Results: No results for input(s): WBC, HGB, HCT, PLT in the last 72 hours. BMET No results for input(s): NA, K, CL, CO2, GLUCOSE, BUN, CREATININE, CALCIUM in the last 72 hours.  Studies/Results: Dg Cervical Spine 2 Or 3 Views  Result Date: 02/08/2016 CLINICAL DATA:  C4-5 decompression and laminectomy with lateral mass fixation. EXAM: DG C-ARM 61-120 MIN; CERVICAL SPINE - 2-3 VIEW COMPARISON:  01/16/2016 FINDINGS: Examination demonstrates normal vertebral body alignment and heights. There is mild spondylosis present. Anterior fusion hardware at the C3-4 level is intact and unchanged. Intervertebral spacer at the C3-4 disc space unchanged. There has been placement of posterior fusion hardware at the C3-4 level intact. IMPRESSION: Placement of posterior fusion hardware at the C3-4 level intact. Existing anterior fusion hardware at the C3-4 level intact. Electronically Signed   By: Marin Olp M.D.   On: 02/08/2016 16:53   Dg C-arm 1-60 Min  Result Date: 02/08/2016 CLINICAL DATA:  C4-5 decompression and laminectomy with lateral mass fixation. EXAM: DG C-ARM 61-120 MIN; CERVICAL SPINE - 2-3 VIEW COMPARISON:  01/16/2016 FINDINGS:  Examination demonstrates normal vertebral body alignment and heights. There is mild spondylosis present. Anterior fusion hardware at the C3-4 level is intact and unchanged. Intervertebral spacer at the C3-4 disc space unchanged. There has been placement of posterior fusion hardware at the C3-4 level intact. IMPRESSION: Placement of posterior fusion hardware at the C3-4 level intact. Existing anterior fusion hardware at the C3-4 level intact. Electronically Signed   By: Marin Olp M.D.   On: 02/08/2016 16:53    Assessment/Plan: Postoperative day 1 posterior cervical decompression fusion doing very well with significant significant improvement in numbness tingling pain in his hands and legs. We'll can mobilize with physical and occupational therapy today consider transfer the floor later today.  LOS: 4 days     Yissel Habermehl P 02/09/2016, 7:05 AM

## 2016-02-09 NOTE — Progress Notes (Signed)
Rehab admissions - I met with patient, wife and his daughter, Tye [place], at the bedside.  They are in agreement to inpatient rehab admission once patient is medically ready.  I gave them rehab booklets and answered questions about CIR.  I anticipate bed availability either tomorrow or Saturday depending on medical readiness.  Call me for questions.  #[phone]

## 2016-02-09 NOTE — Care Management Important Message (Signed)
Important Message  Patient Details  Name: Gary Craig MRN: [id number] Date of Birth: Sep 03, 1933   Medicare Important Message Given:  Other (see comment)    [place], Mckenzey Parcell Abena 02/09/2016, 11:30 AM

## 2016-02-09 NOTE — Progress Notes (Signed)
Speech Language Pathology Treatment: Dysphagia  Patient Details Name: Gary Craig MRN: [id number] DOB: [birth year] Today's Date: 02/09/2016 Time: 0900-0919 SLP Time Calculation (min) (ACUTE ONLY): 19 min  Assessment / Plan / Recommendation Clinical Impression  Skilled treatment focused on skilled observation with po intake. Patient able to utilize strategies for small bites/sips and intermittent hard cough to facilitate airway protection with min verbal cues. RN in room providing meds. One capsule unable to be crushed. Skilled observation revealed increased difficulty clearing pill pharyngeally as characterized by multiple strong swallows and increasing throat clearing. Instructed RN to switch to either crushable pill form or liquid for all meds. Although no anterior, recent surgery has likely facilitated increased edema. Overall function appears consistent with most recent MBS. Will continue current diet as patient appears to be tolerating with no temp spikes and clear lung sounds. Will f/u.    HPI HPI: 82 year old male s/p ACDF C4-5 due to stenosis with myelopathy 3 weeks ago admitted with increased pain and weakness. Initially brought to [facility] [place] by the time he arrived at [facility]. Diagnosed with hematoma at the level of his recent fusion. S/p exploration of cervical fusion with removal of the plate and bone graft and evacuation of epidural hematoma on admission. Now s/p posterior cervical decompressive laminectomy C4-5 8/23.       SLP Plan  Continue with current plan of care     Recommendations  Diet recommendations: Dysphagia 1 (puree);Thin liquid Liquids provided via: Cup;Straw Medication Administration: Crushed with puree Supervision: Staff to assist with self feeding;Intermittent supervision to cue for compensatory strategies Compensations: Slow rate;Small sips/bites;Hard cough after swallow Postural Changes and/or Swallow Maneuvers: Seated upright 90 degrees             Oral Care Recommendations: Oral care BID Follow up Recommendations: Inpatient Rehab Plan: Continue with current plan of care     [place], [place] [phone]   Gabriel Rainwater Meryl 02/09/2016, 9:31 AM

## 2016-02-09 NOTE — Progress Notes (Signed)
Physical Therapy Treatment/Re-evaluation Patient Details Name: Gary Craig MRN: [id number] DOB: 1934/01/23 Today's Date: 02/09/2016    History of Present Illness 82 year old male s/p ACDF C4-5 (01/16/16) due to stenosis with myelopathy 3 weeks ago admitted with increased pain and weakness. Initially brought to [facility] [place] by the time he arrived at [facility] on 02/05/16. Diagnosed with hematoma at the level of his recent fusion. S/p exploration of cervical fusion with removal of the plate and bone graft and evacuation of epidural hematoma (also on 02/05/16).  Patient with other significant PMHx of stroke, CABG, DM, and CAD.  S/p posterior cervical decompression and fusion on 02/08/16.     PT Comments    Pt is POD #1 s/p posterior decompression and fusion and was able to stand with more stability and less buckling over bil legs today.  Right leg continues to be weaker than left leg at 3+/5 ankle DF, PF, 3-/5 knee extension, and left leg with 4/5 ankle PF/DF, 4/5 knee ext.  Pt eager to get back up on his feet and walking, will bring EVA walker again next session and he may be close to being able to progress to normal RW as his grip strength is improving.  I continue to recommend CIR level therapies at discharge.    Follow Up Recommendations  CIR     Equipment Recommendations  Rolling walker with 5" wheels;3in1 (PT)    Recommendations for Other Services   NA     Precautions / Restrictions Precautions Precautions: Fall;Cervical Precaution Comments: reviewed log roll, limited lifting with bil uppers Required Braces or Orthoses: Cervical Brace Cervical Brace: Hard collar    Mobility  Bed Mobility Overal bed mobility: Needs Assistance Bed Mobility: Rolling;Sidelying to Sit Rolling: Mod assist Sidelying to sit: +2 for physical assistance;Mod assist;HOB elevated       General bed mobility comments: Assist needed to help flex bil legs to prepare for log roll, reach for left railing and  support legs and trunk to get to sitting from side lying.    Transfers Overall transfer level: Needs assistance Equipment used: 2 person hand held assist Transfers: Sit to/from [facility] Sit to Stand: +2 physical assistance;Min assist Stand pivot transfers: +2 physical assistance;Min assist       General transfer comment: Two person min assist to support trunk during transition to stand from elevated bed.  Knees blocked for safety, but no significant buckling noted even when side stepping towards chair.  Pt needs cues for safe slow descent into recliner using arms to help control speed.  Recliner chair built up to help him get out of it later with RN staff.   Ambulation/Gait             General Gait Details: NT today, but will likely be ready to progress to gait tomorrow.           Balance Overall balance assessment: Needs assistance Sitting-balance support: Feet supported;Bilateral upper extremity supported Sitting balance-Leahy Scale: Poor Sitting balance - Comments: min guard to supervision at times.  posterior preference.  Did better once scooted out with feet firmly planted on floor.   Worked on sitting posture with head over shoulders.  Practiced shoulder shrugs, scap retractions  Postural control: Posterior lean Standing balance support: Bilateral upper extremity supported Standing balance-Leahy Scale: Poor Standing balance comment: needs external support to stand, but less than in previous sessions.  Worked on upright standing posture without extending trunk backwards (sway back).  Cognition Arousal/Alertness: Awake/alert Behavior During Therapy: WFL for tasks assessed/performed Overall Cognitive Status: Impaired/Different from baseline Area of Impairment: Memory;Awareness     Memory: Decreased short-term memory   Safety/Judgement: Decreased awareness of safety;Decreased awareness of deficits Awareness: Emergent    General Comments: Pt needs reinforcement of previously discussed precautions and physical progression expectations (he has some slightly unrealistic expectations of how quickly he will recover).    Exercises Total Joint Exercises Ankle Circles/Pumps: AROM;Both;20 reps Hip ABduction/ADduction: AROM;Both;10 reps Long Arc Quad: AROM;Both;20 reps General Exercises - Lower Extremity Hip Flexion/Marching: AROM;Both;10 reps;Seated        Pertinent Vitals/Pain Pain Assessment: Faces Faces Pain Scale: Hurts even more Pain Location: posterior neck Pain Descriptors / Indicators: Grimacing;Guarding Pain Intervention(s): Limited activity within patient's tolerance;Monitored during session;Repositioned           PT Goals (current goals can now be found in the care plan section) Acute Rehab PT Goals PT Goal Formulation: With patient/family Time For Goal Achievement: 02/23/16 Potential to Achieve Goals: Good Progress towards PT goals: Progressing toward goals    Frequency  Min 5X/week    PT Plan Current plan remains appropriate    Co-evaluation PT/OT/SLP Co-Evaluation/Treatment: Yes Reason for Co-Treatment: Complexity of the patient's impairments (multi-system involvement);For patient/therapist safety PT goals addressed during session: Mobility/safety with mobility;Balance;Strengthening/ROM       End of Session Equipment Utilized During Treatment: Gait belt;Cervical collar Activity Tolerance: Patient tolerated treatment well Patient left: in chair;with call bell/phone within reach;with chair alarm set     Time: [id number] PT Time Calculation (min) (ACUTE ONLY): 26 min  Charges:    1 re-eval                    Pricella Gaugh B. [place], [place], DPT [phone]   02/09/2016, 11:06 AM

## 2016-02-09 NOTE — Progress Notes (Addendum)
Occupational Therapy Treatment/Re-Evaluation Patient Details Name: Gary Craig MRN: [id number] DOB: [birth year] Today's Date: 02/09/2016    History of present illness 82 year old male s/p ACDF C4-5 (01/16/16) due to stenosis with myelopathy 3 weeks ago admitted with increased pain and weakness. Initially brought to [facility] [place] by the time he arrived at [facility] on 02/05/16. Diagnosed with hematoma at the level of his recent fusion. S/p exploration of cervical fusion with removal of the plate and bone graft and evacuation of epidural hematoma (also on 02/05/16).  Patient with other significant PMHx of stroke, CABG, DM, and CAD.  S/p posterior cervical decompression and fusion on 02/08/16.    OT comments  Pt now s/p posterior decompression and fusion. Today pt was able to perform basic transfer with mod assist +2. Pt demonstrating improvements in UE ROM and strength; able to self feed with use of red foam. Continue to recommend CIR level therapies for follow up to maximize independence and safety with ADL and functional mobility. Will continue to follow acutely.   Follow Up Recommendations  CIR;Supervision/Assistance - 24 hour    Equipment Recommendations  3 in 1 bedside comode;Wheelchair (measurements OT);Wheelchair cushion (measurements OT)    Recommendations for Other Services Rehab consult    Precautions / Restrictions Precautions Precautions: Fall;Cervical Precaution Comments: reviewed log roll, limited lifting with bil uppers Required Braces or Orthoses: Cervical Brace Cervical Brace: Hard collar Restrictions Weight Bearing Restrictions: No       Mobility Bed Mobility Overal bed mobility: Needs Assistance Bed Mobility: Rolling;Sidelying to Sit Rolling: Mod assist Sidelying to sit: +2 for physical assistance;Mod assist;HOB elevated       General bed mobility comments: Assist needed to help flex bil legs to prepare for log roll, reach for left railing and support legs and  trunk to get to sitting from side lying.    Transfers Overall transfer level: Needs assistance Equipment used: 2 person hand held assist Transfers: Sit to/from [facility] Sit to Stand: +2 physical assistance;Min assist Stand pivot transfers: +2 physical assistance;Min assist       General transfer comment: Two person min assist to support trunk during transition to stand from elevated bed.  Knees blocked for safety, but no significant buckling noted even when side stepping towards chair.  Pt needs cues for safe slow descent into recliner using arms to help control speed.  Recliner chair built up to help him get out of it later with RN staff.     Balance Overall balance assessment: Needs assistance Sitting-balance support: Feet supported;Bilateral upper extremity supported Sitting balance-Leahy Scale: Poor Sitting balance - Comments: min guard to supervision at times.  posterior preference.  Did better once scooted out with feet firmly planted on floor.   Worked on sitting posture with head over shoulders.  Practiced shoulder shrugs, scap retractions  Postural control: Posterior lean Standing balance support: Bilateral upper extremity supported Standing balance-Leahy Scale: Poor Standing balance comment: needs external support to stand, but less than in previous sessions.  Worked on upright standing posture without extending trunk backwards (sway back).                     ADL Overall ADL's : Needs assistance/impaired Eating/Feeding: Set up;Bed level;With adaptive utensils Eating/Feeding Details (indicate cue type and reason): Set up with use of red foam for self feeding. Pt able to grip coffee cup to take sips through straw.             Upper  Body Dressing : Moderate assistance;Sitting Upper Body Dressing Details (indicate cue type and reason): to don hospital gown     Toilet Transfer: Moderate assistance;+2 for physical assistance;Stand-pivot;BSC Toilet  Transfer Details (indicate cue type and reason): Simulated by stand pivot from EOB to chair         Functional mobility during ADLs: Moderate assistance;+2 for physical assistance (for stand pivot only) General ADL Comments: Pt very motivated to participate in therapy today. Demonstrating bil UE exercises to OT this session. Encouraged continued ROM and independence with functional activities.      Vision                     Perception     Praxis      Cognition   Behavior During Therapy: [facility] for tasks assessed/performed Overall Cognitive Status: Impaired/Different from baseline Area of Impairment: Memory;Awareness     Memory: Decreased short-term memory    Safety/Judgement: Decreased awareness of safety;Decreased awareness of deficits Awareness: Emergent   General Comments: Pt needs reinforcement of previously discussed precautions and physical progression expectations (he has some slightly unrealistic expectations of how quickly he will recover).    Extremity/Trunk Assessment               Exercises    Shoulder Instructions       General Comments      Pertinent Vitals/ Pain       Pain Assessment: Faces Faces Pain Scale: Hurts even more Pain Location: posterior neck Pain Descriptors / Indicators: Grimacing;Guarding Pain Intervention(s): Limited activity within patient's tolerance;Monitored during session;Repositioned  Home Living                                          Prior Functioning/Environment              Frequency Min 3X/week     Progress Toward Goals  OT Goals(current goals can now be found in the care plan section)  Progress towards OT goals: Progressing toward goals  Acute Rehab OT Goals Patient Stated Goal: to have full return of function OT Goal Formulation: With patient  Plan Discharge plan remains appropriate    Co-evaluation    PT/OT/SLP Co-Evaluation/Treatment: Yes Reason for Co-Treatment:  Complexity of the patient's impairments (multi-system involvement);For patient/therapist safety PT goals addressed during session: Mobility/safety with mobility;Balance;Strengthening/ROM OT goals addressed during session: ADL's and self-care;Other (comment) (mobility)      End of Session Equipment Utilized During Treatment: Gait belt;Cervical collar   Activity Tolerance Patient tolerated treatment well   Patient Left in chair;with call bell/phone within reach;with chair alarm set   Nurse Communication Mobility status        Time: 1009-1030 OT Time Calculation (min): 21 min  Charges: OT General Charges $OT Visit: 1 Procedure OT Evaluation $OT Re-eval: 1 Procedure  Binnie Kand M.S., OTR/L Pager: [phone]  02/09/2016, 11:35 AM

## 2016-02-09 NOTE — PMR Pre-admission (Signed)
PMR Admission Coordinator Pre-Admission Assessment  Patient: Gary Craig is an 82 y.o., male MRN: [id number] DOB: 15-Apr-1934 Height: 5\' 8"  (172.7 cm) Weight: 70.9 kg (156 lb 4.9 oz)              Insurance Information HMO: No   PPO:       PCP:       IPA:       80/20:       OTHER:   PRIMARY:  Medicare A/B      Policy#: [id number]      Subscriber: Jacquiline Doe CM Name:        Phone#:       Fax#:   Pre-Cert#:        Employer:  Retired Benefits:  Phone #:       Name: Checked in [place]. Date: 02/16/01     Deduct: $1316      Out of Pocket Max: none      Life Max: unlimited CIR: 100%      SNF: 100 days Outpatient: 80%     Co-Pay: 20% Home Health: 100%      Co-Pay: none DME: 80%     Co-Pay: 20% Providers: patient's choice  SECONDARY: Tricare      Policy#: [id number]      Subscriber: Jacquiline Doe CM Name:        Phone#:       Fax#:   Pre-Cert#:        Employer: Retired Benefits:  Phone #: [phone]     Name:   Eff. Date:       Deduct:        Out of Pocket Max:        Life Max:   CIR:        SNF:   Outpatient:       Co-Pay:   Home Health:        Co-Pay:   DME:       Co-Pay:    Emergency Contact Information Contact Information    Name Relation Home Work [place] [phone]  [phone]   Reder,Cathy Daughter   [phone]     Current Medical History  Patient Admitting Diagnosis: Cervical spondylitic myelopathy  History of Present Illness:  A 82 y.o.right handed malewith history of diabetes mellitus, CAD status post CABG, CVA maintained on Plavix. Per chart review patient lives with wife 2 level home with bedroom on main floor. 3 steps to entry. Independent prior to admission and active prior to initial ACDF. 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 [name] and discharged to home 01/16/2016. Patient developed neck pain and numbness sensation in the hands with  progressive quadriparesis. MRI of the cervical spine showed abnormal fluid collection projecting dorsally from the C4-5 interspace. Mild peripheral enhancement. Mild cord flattening and effacement of both C5 nerve roots. Mild edema in the C4 and C5 vertebral bodies. Underwent exploration of C4-5 fusion. Removal of the plate and the bone graft. Evacuation of epidural hematoma. Insertion of a new allograft 9 mm height plate 02/06/2016 per Dr. [name] followed by posterior cervical decompression 02/08/2016 per Dr. [name]. [facility] course pain management. Cervical collar was placed. Decadron protocol as indicated. Currently on a dysphagia #1 thin liquid diet. Physical therapy evaluation completed 02/06/2016 with recommendations of physical medicine rehabilitation consult.  Past Medical History  Past Medical History:  Diagnosis Date  . CAD (coronary artery disease)   .  Carotid stenosis   . Diabetes mellitus without complication ([place])   . GERD (gastroesophageal reflux disease)    zantac as needed  . High cholesterol   . History of kidney stones   . Hypothyroidism   . S/P CABG (coronary artery bypass graft)   . Skin cancer   . Stroke [facility]) 2012   tia    Family History  family history includes CAD in his brother; Heart block in his brother.  Prior Rehab/Hospitalizations: Recent surgery 01/16/16 and more surgery 02/05/16  Has the patient had major surgery during 100 days prior to admission? Yes.  Had cervical neck surgery 01/16/16.  Current Medications   Current Facility-Administered Medications:  .  0.9 %  sodium chloride infusion, 250 mL, Intravenous, Continuous, Leeroy Cha, MD .  0.9 %  sodium chloride infusion, , Intravenous, Continuous, Leeroy Cha, MD, Stopped at 02/09/16 1000 .  0.9 %  sodium chloride infusion, 250 mL, Intravenous, Continuous, Kary Kos, MD .  acetaminophen (TYLENOL) tablet 650 mg, 650 mg, Oral, Q4H PRN **OR** acetaminophen (TYLENOL) suppository 650 mg, 650 mg,  Rectal, Q4H PRN, Leeroy Cha, MD .  atorvastatin (LIPITOR) tablet 40 mg, 40 mg, Oral, QHS, Leeroy Cha, MD, 40 mg at 02/09/16 2100 .  bisacodyl (DULCOLAX) suppository 10 mg, 10 mg, Rectal, Daily PRN, Leeroy Cha, MD, 10 mg at 02/07/16 1546 .  cyclobenzaprine (FLEXERIL) tablet 10 mg, 10 mg, Oral, TID PRN, Kary Kos, MD .  dexamethasone (DECADRON) injection 4 mg, 4 mg, Intravenous, Q6H, 4 mg at 02/10/16 1254 **OR** dexamethasone (DECADRON) tablet 4 mg, 4 mg, Oral, Q6H, Leeroy Cha, MD, 4 mg at 02/07/16 2317 .  docusate (COLACE) 50 MG/5ML liquid 100 mg, 100 mg, Oral, BID, Kary Kos, MD, 100 mg at 02/10/16 0926 .  famotidine (PEPCID) tablet 40 mg, 40 mg, Oral, BID, Kary Kos, MD, 40 mg at 02/10/16 0925 .  HYDROmorphone (DILAUDID) injection 0.5-1 mg, 0.5-1 mg, Intravenous, Q2H PRN, Kary Kos, MD .  insulin aspart (novoLOG) injection 0-5 Units, 0-5 Units, Subcutaneous, QHS, Leeroy Cha, MD, 2 Units at 02/08/16 2131 .  insulin aspart (novoLOG) injection 0-9 Units, 0-9 Units, Subcutaneous, TID WC, Leeroy Cha, MD, 2 Units at 02/10/16 1253 .  levothyroxine (SYNTHROID, LEVOTHROID) tablet 112 mcg, 112 mcg, Oral, QAC breakfast, Leeroy Cha, MD, 112 mcg at 02/10/16 [phone] .  lisinopril (PRINIVIL,ZESTRIL) tablet 2.5 mg, 2.5 mg, Oral, BH-q7a, Leeroy Cha, MD, 2.5 mg at 02/10/16 [phone] .  menthol-cetylpyridinium (CEPACOL) lozenge 3 mg, 1 lozenge, Oral, PRN **OR** phenol (CHLORASEPTIC) mouth spray 1 spray, 1 spray, Mouth/Throat, PRN, Leeroy Cha, MD .  metFORMIN (GLUCOPHAGE-XR) 24 hr tablet 500 mg, 500 mg, Oral, Q breakfast, Leeroy Cha, MD, 500 mg at 02/10/16 0925 .  morphine 2 MG/ML injection 1-4 mg, 1-4 mg, Intravenous, Q3H PRN, Leeroy Cha, MD, 2 mg at 02/09/16 2100 .  ondansetron (ZOFRAN) injection 4 mg, 4 mg, Intravenous, Q4H PRN, Leeroy Cha, MD, 4 mg at 02/08/16 1639 .  oxyCODONE-acetaminophen (PERCOCET/ROXICET) 5-325 MG per tablet 1-2 tablet, 1-2 tablet, Oral, Q4H PRN, Kary Kos, MD, 1 tablet at 02/09/16 0631 .  senna-docusate (Senokot-S) tablet 1 tablet, 1 tablet, Oral, QHS PRN, Kary Kos, MD .  sodium chloride flush (NS) 0.9 % injection 3 mL, 3 mL, Intravenous, Q12H, Leeroy Cha, MD, 3 mL at 02/09/16 2200 .  sodium chloride flush (NS) 0.9 % injection 3 mL, 3 mL, Intravenous, PRN, Leeroy Cha, MD .  sodium chloride flush (NS) 0.9 % injection 3 mL, 3 mL, Intravenous, Q12H,  Kary Kos, MD, 3 mL at 02/09/16 2119 .  sodium chloride flush (NS) 0.9 % injection 3 mL, 3 mL, Intravenous, PRN, Kary Kos, MD  Patients Current Diet: DIET - DYS 1 Room service appropriate? Yes; Fluid consistency: Thin  Precautions / Restrictions Precautions Precautions: Fall, Cervical Precaution Comments: reviewed log roll, limited lifting with bil uppers Cervical Brace: Hard collar Restrictions Weight Bearing Restrictions: No   Has the patient had 2 or more falls or a fall with injury in the past year?Yes.  Has had 3 falls.  Hit is forehead and had to go to hospital ED for treatment.  Prior Activity Level Community (5-7x/wk): Went out daily.  Played golf, walked, worked in his garden.  Was driving.  Home Assistive Devices / Equipment Home Assistive Devices/Equipment: Eyeglasses, CBG Meter Home Equipment: Grab bars - tub/shower, Cane - single point  Prior Device Use: Indicate devices/aids used by the patient prior to current illness, exacerbation or injury? None  Prior Functional Level Prior Function Level of Independence: Independent Comments: walk 5-6 miles Monday through friday for 40 years and play golf twice a week.   Self Care: Did the patient need help bathing, dressing, using the toilet or eating?  Independent  Indoor Mobility: Did the patient need assistance with walking from room to room (with or without device)? Independent  Stairs: Did the patient need assistance with internal or external stairs (with or without device)? Independent  Functional Cognition: Did  the patient need help planning regular tasks such as shopping or remembering to take medications? Independent  Current Functional Level Cognition  Overall Cognitive Status: Impaired/Different from baseline Orientation Level: Oriented X4 Safety/Judgement: Decreased awareness of safety, Decreased awareness of deficits General Comments: Pt needs reinforcement of previously discussed precautions and physical progression expectations (he has some slightly unrealistic expectations of how quickly he will recover).    Extremity Assessment (includes Sensation/Coordination)  Upper Extremity Assessment: Defer to OT evaluation RUE Deficits / Details: brunstrom II grasp, shoulder flexion 90 degrees, wrist extension present but limited due to IV location for testing. pt with decr digit extension RUE Sensation: decreased light touch RUE Coordination: decreased fine motor, decreased gross motor LUE Deficits / Details: brunstrom III grasp pt able to hold objects against gravity. pt shoulder flexion 90 degrees and able to sustain against gravity wrist extension 3 out 5 LUE Sensation: decreased light touch LUE Coordination: decreased fine motor, decreased gross motor  Lower Extremity Assessment: RLE deficits/detail, LLE deficits/detail RLE Deficits / Details: right leg is weaker than left leg.  Right ankle 2+/5, right knee 3-/5, right hip 2+/5 RLE Sensation: decreased light touch RLE Coordination: decreased fine motor, decreased gross motor LLE Deficits / Details: left leg stronger than right leg, ankle 3/5, knee 3-/5, hip 3-/5 LLE Sensation: decreased light touch LLE Coordination: decreased fine motor, decreased gross motor    ADLs  Overall ADL's : Needs assistance/impaired Eating/Feeding: Set up, Bed level, With adaptive utensils Eating/Feeding Details (indicate cue type and reason): Set up with use of red foam for self feeding. Pt able to grip coffee cup to take sips through straw. Grooming: Wash/dry  face, Minimal assistance, Sitting Grooming Details (indicate cue type and reason): required cues to use R hand and not allow L hand to complete the task Upper Body Dressing : Moderate assistance, Sitting Upper Body Dressing Details (indicate cue type and reason): to don hospital gown Toilet Transfer: Moderate assistance, +2 for physical assistance, Stand-pivot, BSC Toilet Transfer Details (indicate cue type and reason): Simulated by  stand pivot from EOB to chair Functional mobility during ADLs: Moderate assistance, +2 for physical assistance (for stand pivot only) General ADL Comments: Pt very motivated to participate in therapy today. Demonstrating bil UE exercises to OT this session. Encouraged continued ROM and independence with functional activities.    Mobility  Overal bed mobility: Needs Assistance Bed Mobility: Rolling, Sidelying to Sit Rolling: Mod assist Sidelying to sit: +2 for physical assistance, Mod assist, HOB elevated Supine to sit: Max assist, HOB elevated General bed mobility comments: Assist needed to help flex bil legs to prepare for log roll, reach for left railing and support legs and trunk to get to sitting from side lying.      Transfers  Overall transfer level: Needs assistance Equipment used: 2 person hand held assist Transfer via Lift Equipment:  (EVA stedy) Transfers: Sit to/from Stand, [employer] Transfers Sit to Stand: +2 physical assistance, Min assist Stand pivot transfers: +2 physical assistance, Min assist General transfer comment: Two person min assist to support trunk during transition to stand from elevated bed.  Knees blocked for safety, but no significant buckling noted even when side stepping towards chair.  Pt needs cues for safe slow descent into recliner using arms to help control speed.  Recliner chair built up to help him get out of it later with RN staff.     Ambulation / Gait / Stairs / Wheelchair Mobility  Ambulation/Gait Ambulation/Gait  assistance: Mod assist, +2 physical assistance, +2 safety/equipment Ambulation Distance (Feet): 50 Feet Assistive device:  (EVA walker) Gait Pattern/deviations: Step-through pattern General Gait Details: NT today, but will likely be ready to progress to gait tomorrow.  Gait velocity: decreased Gait velocity interpretation: Below normal speed for age/gender    Posture / Balance Dynamic Sitting Balance Sitting balance - Comments: min guard to supervision at times.  posterior preference.  Did better once scooted out with feet firmly planted on floor.   Worked on sitting posture with head over shoulders.  Practiced shoulder shrugs, scap retractions  Balance Overall balance assessment: Needs assistance Sitting-balance support: Feet supported, Bilateral upper extremity supported Sitting balance-Leahy Scale: Poor Sitting balance - Comments: min guard to supervision at times.  posterior preference.  Did better once scooted out with feet firmly planted on floor.   Worked on sitting posture with head over shoulders.  Practiced shoulder shrugs, scap retractions  Postural control: Posterior lean Standing balance support: Bilateral upper extremity supported Standing balance-Leahy Scale: Poor Standing balance comment: needs external support to stand, but less than in previous sessions.  Worked on upright standing posture without extending trunk backwards (sway back).      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  Voiding without incontinence. Diabetic mgmt Yes, on metformin at home orally, but on insulin in hospital    Previous Home Environment Living  Arrangements: Spouse/significant other Available Help at Discharge: Family, Available 24 hours/day Type of Home: House Home Layout: Two level, Able to live on main level  with bedroom/bathroom (his office is upstairs) Alternate Level Stairs-Number of Steps: flight Home Access: Stairs to enter Entrance Stairs-Rails: Right Entrance Stairs-Number of Steps: 3 Bathroom Shower/Tub: Chiropodist: Standard Home Care Services: No  Discharge Living Setting Plans for Discharge Living Setting: Patient's home, House, Lives with (comment) (Lives with wife.) Type of Home at Discharge: House Discharge Home Layout: Two level, Able to live on main level with bedroom/bathroom Alternate Level Stairs-Number of Steps: Flight Discharge Home Access: Stairs to enter [employer] of Steps: 3 steps at deck entry and 4 steps at garage entry Does the patient have any problems obtaining your medications?: No  Social/Family/Support Systems Patient Roles: Spouse, Parent (Has a wife and 2 daughters.) Contact Information: Kristan Nilsson - wife Anticipated Caregiver: wife Anticipated Caregiver's Contact Information: [name] - wife - (h) [phone] Ability/Limitations of Caregiver: Wife can assist. Caregiver Availability: 24/7 Discharge Plan Discussed with Primary Caregiver: Yes Is Caregiver In Agreement with Plan?: Yes Does Caregiver/Family have Issues with Lodging/Transportation while Pt is in Rehab?: No  Goals/Additional Needs Patient/Family Goal for Rehab: PT/OT supervision and min assist, ST mod I and Independent goals Expected length of stay: 16-19 days Cultural Considerations: Attends [employer] Dietary Needs: Dys 1, thin liquids Equipment Needs: TBD Pt/Family Agrees to Admission and willing to participate: Yes Program Orientation Provided & Reviewed with Pt/Caregiver Including Roles  & Responsibilities: Yes  Decrease burden of Care through IP rehab admission:  N/A  Possible need for SNF placement upon discharge: Not planned  Patient Condition: This patient's medical and functional status has changed since the consult dated: 02/07/16 in which the Rehabilitation Physician determined and documented that the patient's condition is appropriate for intensive rehabilitative care in an inpatient rehabilitation facility. See "History of Present Illness" (above) for medical update. Functional changes are: Currently requiring min assist for transfers. Patient's medical and functional status update has been discussed with the Rehabilitation physician and patient remains appropriate for inpatient rehabilitation. Will admit to inpatient rehab today.  Preadmission Screen Completed By:  Retta Diones, 02/10/2016 2:35 PM ______________________________________________________________________   Discussed status with Dr.  Naaman Plummer on 02/10/16 at 1425 and received telephone approval for admission today.  Admission Coordinator:  Retta Diones, time1425/Date08/25/17

## 2016-02-10 ENCOUNTER — Inpatient Hospital Stay (HOSPITAL_COMMUNITY)
Admission: RE | Admit: 2016-02-10 | Discharge: 2016-02-24 | DRG: 052 | Disposition: A | Discharge status: Home / Self Care | Payer: Medicare Other | Source: Intra-hospital | Attending: Physical Medicine & Rehabilitation | Admitting: Physical Medicine & Rehabilitation

## 2016-02-10 DIAGNOSIS — I2510 Atherosclerotic heart disease of native coronary artery without angina pectoris: Secondary | ICD-10-CM | POA: Diagnosis present

## 2016-02-10 DIAGNOSIS — Z87891 Personal history of nicotine dependence: Secondary | ICD-10-CM

## 2016-02-10 DIAGNOSIS — Z79899 Other long term (current) drug therapy: Secondary | ICD-10-CM | POA: Diagnosis not present

## 2016-02-10 DIAGNOSIS — I10 Essential (primary) hypertension: Secondary | ICD-10-CM | POA: Diagnosis present

## 2016-02-10 DIAGNOSIS — R1310 Dysphagia, unspecified: Secondary | ICD-10-CM | POA: Diagnosis present

## 2016-02-10 DIAGNOSIS — G959 Disease of spinal cord, unspecified: Secondary | ICD-10-CM | POA: Diagnosis present

## 2016-02-10 DIAGNOSIS — Z8673 Personal history of transient ischemic attack (TIA), and cerebral infarction without residual deficits: Secondary | ICD-10-CM

## 2016-02-10 DIAGNOSIS — M79661 Pain in right lower leg: Secondary | ICD-10-CM | POA: Diagnosis not present

## 2016-02-10 DIAGNOSIS — E039 Hypothyroidism, unspecified: Secondary | ICD-10-CM | POA: Diagnosis present

## 2016-02-10 DIAGNOSIS — E785 Hyperlipidemia, unspecified: Secondary | ICD-10-CM | POA: Diagnosis present

## 2016-02-10 DIAGNOSIS — N390 Urinary tract infection, site not specified: Secondary | ICD-10-CM | POA: Diagnosis not present

## 2016-02-10 DIAGNOSIS — Z951 Presence of aortocoronary bypass graft: Secondary | ICD-10-CM

## 2016-02-10 DIAGNOSIS — M4712 Other spondylosis with myelopathy, cervical region: Secondary | ICD-10-CM | POA: Diagnosis not present

## 2016-02-10 DIAGNOSIS — Z23 Encounter for immunization: Secondary | ICD-10-CM

## 2016-02-10 DIAGNOSIS — Z981 Arthrodesis status: Secondary | ICD-10-CM | POA: Diagnosis not present

## 2016-02-10 DIAGNOSIS — K219 Gastro-esophageal reflux disease without esophagitis: Secondary | ICD-10-CM | POA: Diagnosis present

## 2016-02-10 DIAGNOSIS — G8250 Quadriplegia, unspecified: Principal | ICD-10-CM | POA: Diagnosis present

## 2016-02-10 DIAGNOSIS — E119 Type 2 diabetes mellitus without complications: Secondary | ICD-10-CM | POA: Diagnosis not present

## 2016-02-10 DIAGNOSIS — Z7984 Long term (current) use of oral hypoglycemic drugs: Secondary | ICD-10-CM | POA: Diagnosis not present

## 2016-02-10 DIAGNOSIS — R4587 Impulsiveness: Secondary | ICD-10-CM | POA: Diagnosis not present

## 2016-02-10 DIAGNOSIS — E1142 Type 2 diabetes mellitus with diabetic polyneuropathy: Secondary | ICD-10-CM | POA: Diagnosis present

## 2016-02-10 DIAGNOSIS — M79662 Pain in left lower leg: Secondary | ICD-10-CM | POA: Diagnosis not present

## 2016-02-10 DIAGNOSIS — B9689 Other specified bacterial agents as the cause of diseases classified elsewhere: Secondary | ICD-10-CM | POA: Diagnosis not present

## 2016-02-10 LAB — GLUCOSE, CAPILLARY
GLUCOSE-CAPILLARY: 158 mg/dL — AB (ref 65–99)
GLUCOSE-CAPILLARY: 158 mg/dL — AB (ref 65–99)
Glucose-Capillary: 186 mg/dL — ABNORMAL HIGH (ref 65–99)
Glucose-Capillary: 248 mg/dL — ABNORMAL HIGH (ref 65–99)

## 2016-02-10 MED ORDER — FAMOTIDINE 40 MG PO TABS
40.0000 mg | ORAL_TABLET | Freq: Two times a day (BID) | ORAL | Status: DC
  Administered 2016-02-10 – 2016-02-24 (×28): 40 mg via ORAL
  Filled 2016-02-10 (×28): qty 1

## 2016-02-10 MED ORDER — ONDANSETRON HCL 4 MG PO TABS: 4.0000 mg | ORAL_TABLET | Freq: Four times a day (QID) | ORAL | Status: DC | PRN

## 2016-02-10 MED ORDER — INSULIN ASPART 100 UNIT/ML ~~LOC~~ SOLN
0.0000 [IU] | Freq: Every day | SUBCUTANEOUS | Status: DC
  Administered 2016-02-10: 2 [IU] via SUBCUTANEOUS
  Administered 2016-02-11: 3 [IU] via SUBCUTANEOUS
  Administered 2016-02-12 – 2016-02-14 (×3): 2 [IU] via SUBCUTANEOUS
  Administered 2016-02-15 – 2016-02-16 (×2): 3 [IU] via SUBCUTANEOUS
  Administered 2016-02-17 – 2016-02-18 (×2): 2 [IU] via SUBCUTANEOUS
  Administered 2016-02-19: 4 [IU] via SUBCUTANEOUS
  Administered 2016-02-20 – 2016-02-21 (×2): 2 [IU] via SUBCUTANEOUS

## 2016-02-10 MED ORDER — ACETAMINOPHEN 325 MG PO TABS
650.0000 mg | ORAL_TABLET | ORAL | Status: DC | PRN
  Administered 2016-02-13 – 2016-02-21 (×4): 650 mg via ORAL
  Filled 2016-02-10 (×4): qty 2

## 2016-02-10 MED ORDER — LISINOPRIL 2.5 MG PO TABS
2.5000 mg | ORAL_TABLET | ORAL | Status: DC
  Administered 2016-02-11: 2.5 mg via ORAL
  Filled 2016-02-10: qty 1

## 2016-02-10 MED ORDER — ACETAMINOPHEN 650 MG RE SUPP: 650.0000 mg | RECTAL | Status: DC | PRN

## 2016-02-10 MED ORDER — SORBITOL 70 % SOLN: 30.0000 mL | Freq: Every day | Status: DC | PRN

## 2016-02-10 MED ORDER — PNEUMOCOCCAL VAC POLYVALENT 25 MCG/0.5ML IJ INJ
0.5000 mL | INJECTION | INTRAMUSCULAR | Status: AC
  Administered 2016-02-11: 0.5 mL via INTRAMUSCULAR
  Filled 2016-02-10: qty 0.5

## 2016-02-10 MED ORDER — DOCUSATE SODIUM 50 MG/5ML PO LIQD
100.0000 mg | Freq: Two times a day (BID) | ORAL | Status: DC
  Administered 2016-02-11: 100 mg via ORAL
  Filled 2016-02-10 (×3): qty 10

## 2016-02-10 MED ORDER — LEVOTHYROXINE SODIUM 112 MCG PO TABS
112.0000 ug | ORAL_TABLET | Freq: Every day | ORAL | Status: DC
  Administered 2016-02-11 – 2016-02-24 (×14): 112 ug via ORAL
  Filled 2016-02-10 (×14): qty 1

## 2016-02-10 MED ORDER — DEXAMETHASONE SODIUM PHOSPHATE 4 MG/ML IJ SOLN
4.0000 mg | Freq: Four times a day (QID) | INTRAMUSCULAR | Status: DC
  Administered 2016-02-11: 4 mg via INTRAVENOUS
  Filled 2016-02-10 (×6): qty 1

## 2016-02-10 MED ORDER — OXYCODONE-ACETAMINOPHEN 5-325 MG PO TABS
1.0000 | ORAL_TABLET | ORAL | Status: DC | PRN
  Administered 2016-02-12 – 2016-02-17 (×5): 1 via ORAL
  Filled 2016-02-10 (×2): qty 1
  Filled 2016-02-10: qty 2
  Filled 2016-02-10 (×3): qty 1

## 2016-02-10 MED ORDER — BISACODYL 10 MG RE SUPP: 10.0000 mg | Freq: Every day | RECTAL | Status: DC | PRN

## 2016-02-10 MED ORDER — METFORMIN HCL ER 500 MG PO TB24
500.0000 mg | ORAL_TABLET | Freq: Every day | ORAL | Status: DC
  Filled 2016-02-10: qty 1

## 2016-02-10 MED ORDER — DEXAMETHASONE 4 MG PO TABS
4.0000 mg | ORAL_TABLET | Freq: Four times a day (QID) | ORAL | Status: DC
  Administered 2016-02-10 – 2016-02-13 (×10): 4 mg via ORAL
  Filled 2016-02-10 (×10): qty 1

## 2016-02-10 MED ORDER — CYCLOBENZAPRINE HCL 10 MG PO TABS: 10.0000 mg | ORAL_TABLET | Freq: Three times a day (TID) | ORAL | Status: DC | PRN

## 2016-02-10 MED ORDER — ATORVASTATIN CALCIUM 40 MG PO TABS
40.0000 mg | ORAL_TABLET | Freq: Every day | ORAL | Status: DC
  Administered 2016-02-10 – 2016-02-23 (×14): 40 mg via ORAL
  Filled 2016-02-10 (×14): qty 1

## 2016-02-10 MED ORDER — ONDANSETRON HCL 4 MG/2ML IJ SOLN: 4.0000 mg | Freq: Four times a day (QID) | INTRAMUSCULAR | Status: DC | PRN

## 2016-02-10 NOTE — Progress Notes (Signed)
Pt admitted to rehab with family at bedside. RN reviewed rehab booklet, safety plan and therapy schedule with pt and family. Call bell within reach, family at bedside.

## 2016-02-10 NOTE — Care Management Note (Signed)
Case Management Note  Patient Details  Name: Gary Craig MRN: [id number] Date of Birth: [birth year]  Subjective/Objective:    Pt medically stable for dc today.                  Action/Plan: Plan dc to [facility] later today.    Expected Discharge Date:   02/10/16               Expected Discharge Plan:  [place]  In-House Referral:     Discharge planning Services  CM Consult  Post Acute Care Choice:    Choice offered to:     DME Arranged:    DME Agency:     HH Arranged:    HH Agency:     Status of Service:  Completed, signed off  If discussed at [employer], dates discussed:    Additional Comments:  Reinaldo Raddle, RN, BSN  Trauma/Neuro ICU Case Manager [phone]

## 2016-02-10 NOTE — H&P (View-Only) (Signed)
Physical Medicine and Rehabilitation Admission H&P    Chief complaint: Weakness  HPI: Gary Craig is a 82 y.o. right handed male with history of diabetes mellitus, CAD status post CABG, CVA maintained on  Plavix. Per chart review patient lives with wife 2 level home with bedroom on main floor. 3 steps to entry. Independent prior to admission and active prior to initial ACDF. 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 [name] and discharged to home 01/16/2016. Patient developed neck pain and numbness sensation in the hands with progressive quadriparesis. MRI of the cervical spine showed abnormal fluid collection projecting dorsally from the C4-5 interspace. Mild peripheral enhancement. Mild cord flattening and effacement of both C5 nerve roots. Mild edema in the C4 and C5 vertebral bodies. Underwent exploration of C4-5 fusion. Removal of the plate and the bone graft. Evacuation of epidural hematoma. Insertion of a new allograft 9 mm height plate 02/06/2016 per Dr. [name] followed by posterior cervical decompression 02/08/2016 per Dr. [name]. [facility] course pain management. Cervical collar was placed. Decadron protocol as indicated. Currently on a dysphagia #1 thin liquid diet. Physical therapy evaluation completed 02/06/2016 with recommendations of physical medicine rehabilitation consult  ROS Constitutional: Negative for chills and fever.  HENT: Negative for hearing loss.   Eyes: Negative for blurred vision and double vision.  Respiratory: Negative for cough and shortness of breath.   Cardiovascular: Negative for palpitations and leg swelling.  Gastrointestinal: Positive for constipation. Negative for nausea and vomiting.       GERD  Genitourinary: Positive for urgency. Negative for dysuria and hematuria.  Musculoskeletal: Positive for neck pain.  Skin: Negative for rash.  Neurological: Positive for sensory  change, focal weakness and weakness. Negative for seizures and headaches.       Numbness upper extremities  All other systems reviewed and are negative   Past Medical History:  Diagnosis Date  . CAD (coronary artery disease)   . Carotid stenosis   . Diabetes mellitus without complication (Craig)   . GERD (gastroesophageal reflux disease)    zantac as needed  . High cholesterol   . History of kidney stones   . Hypothyroidism   . S/P CABG (coronary artery bypass graft)   . Skin cancer   . Stroke [facility]) 2012   tia   Past Surgical History:  Procedure Laterality Date  . ANTERIOR CERVICAL DECOMP/DISCECTOMY FUSION N/A 01/16/2016   Procedure: Cervical Four-Five Anterior cervical decompression/diskectomy/fusion;  Surgeon: Kary Kos, MD;  Location: [place] NEURO ORS;  Service: Neurosurgery;  Laterality: N/A;  right side approach  . CERVICAL WOUND DEBRIDEMENT N/A 02/05/2016   Procedure: Exploration of Cervical four - five wound. Removal of cervical four - five instrumentation. Insertion of new cervical four - five instrumentation.;  Surgeon: Leeroy Cha, MD;  Location: [facility] NEURO ORS;  Service: Neurosurgery;  Laterality: N/A;  Exploration of Cervical four - five wound. Removal of cervical four - five instrumentation. Insertion of new cervical four - five instrumentatio  . CORONARY ARTERY BYPASS GRAFT  11/29/2004  . EAR BIOPSY     skin ca  . LITHOTRIPSY Right   . POSTERIOR CERVICAL FUSION/FORAMINOTOMY N/A 02/08/2016   Procedure: CERVICAL FOUR-FIVE  Decompressive Laminectomy and Lateral Mass Fixation;  Surgeon: Kary Kos, MD;  Location: [place] NEURO ORS;  Service: Neurosurgery;  Laterality: N/A;  . TONSILLECTOMY     Family History  Problem Relation Age of Onset  . CAD Brother   .  Heart block Brother    Social History:  reports that he quit smoking about 33 years ago. His smoking use included Cigarettes, Pipe, and Cigars. He smoked 1.00 pack per day. He has never used smokeless tobacco. He reports that he  drinks alcohol. He reports that he does not use drugs. Allergies:  Allergies  Allergen Reactions  . No Known Allergies    Medications Prior to Admission  Medication Sig Dispense Refill  . atorvastatin (LIPITOR) 40 MG tablet Take 1 tablet by mouth at bedtime.     . Glucosamine-Chondroitin 500-400 MG CAPS Take 1 tablet by mouth every morning.     Marland Kitchen levothyroxine (SYNTHROID, LEVOTHROID) 112 MCG tablet Take 112 mcg by mouth daily before breakfast.     . lisinopril (PRINIVIL,ZESTRIL) 2.5 MG tablet Take 2.5 mg by mouth every morning.     . metFORMIN (GLUCOPHAGE-XR) 500 MG 24 hr tablet Take 500 mg by mouth daily with breakfast.    . Multiple Vitamin (MULTIVITAMIN WITH MINERALS) TABS tablet Take 1 tablet by mouth every morning.     Marland Kitchen omega-3 acid ethyl esters (LOVAZA) 1 g capsule Take 1 g by mouth 2 (two) times daily.    Marland Kitchen oxyCODONE-acetaminophen (PERCOCET/ROXICET) 5-325 MG tablet Take 1 tablet by mouth every 6 (six) hours as needed for pain.    . ranitidine (ZANTAC) 75 MG tablet Take 75 mg by mouth daily as needed for heartburn.       Home: Home Living Family/patient expects to be discharged to:: Private residence Living Arrangements: Spouse/significant other Available Help at Discharge: Family, Available 24 hours/day Type of Home: House Home Access: Stairs to enter [employer] of Steps: 3 Entrance Stairs-Rails: Right Home Layout: Two level, Able to live on main level with bedroom/bathroom (his office is upstairs) Alternate Level Stairs-Number of Steps: flight Bathroom Shower/Tub: Chiropodist: Standard Home Equipment: Grab bars - tub/shower, Cane - single point   Functional History: Prior Function Level of Independence: Independent Comments: walk 5-6 miles Monday through friday for 40 years and play golf twice a week.   Functional Status:  Mobility: Bed Mobility Overal bed mobility: Needs Assistance Bed Mobility: Rolling, Sidelying to Sit Rolling:  Mod assist Sidelying to sit: +2 for physical assistance, Mod assist, HOB elevated Supine to sit: Max assist, HOB elevated General bed mobility comments: Assist needed to help flex bil legs to prepare for log roll, reach for left railing and support legs and trunk to get to sitting from side lying.   Transfers Overall transfer level: Needs assistance Equipment used: 2 person hand held assist Transfer via Lift Equipment:  (EVA stedy) Transfers: Sit to/from Stand, [employer] Transfers Sit to Stand: +2 physical assistance, Min assist Stand pivot transfers: +2 physical assistance, Min assist General transfer comment: Two person min assist to support trunk during transition to stand from elevated bed.  Knees blocked for safety, but no significant buckling noted even when side stepping towards chair.  Pt needs cues for safe slow descent into recliner using arms to help control speed.  Recliner chair built up to help him get out of it later with RN staff.  Ambulation/Gait Ambulation/Gait assistance: Mod assist, +2 physical assistance, +2 safety/equipment Ambulation Distance (Feet): 50 Feet Assistive device:  (EVA walker) Gait Pattern/deviations: Step-through pattern General Gait Details: NT today, but will likely be ready to progress to gait tomorrow.  Gait velocity: decreased Gait velocity interpretation: Below normal speed for age/gender    ADL: ADL Overall ADL's : Needs assistance/impaired Eating/Feeding:  Set up, Bed level, With adaptive utensils Eating/Feeding Details (indicate cue type and reason): Set up with use of red foam for self feeding. Pt able to grip coffee cup to take sips through straw. Grooming: Wash/dry face, Minimal assistance, Sitting Grooming Details (indicate cue type and reason): required cues to use R hand and not allow L hand to complete the task Upper Body Dressing : Moderate assistance, Sitting Upper Body Dressing Details (indicate cue type and reason): to don  hospital gown Toilet Transfer: Moderate assistance, +2 for physical assistance, Stand-pivot, BSC Toilet Transfer Details (indicate cue type and reason): Simulated by stand pivot from EOB to chair Functional mobility during ADLs: Moderate assistance, +2 for physical assistance (for stand pivot only) General ADL Comments: Pt very motivated to participate in therapy today. Demonstrating bil UE exercises to OT this session. Encouraged continued ROM and independence with functional activities.  Cognition: Cognition Overall Cognitive Status: Impaired/Different from baseline Orientation Level: Oriented X4 Cognition Arousal/Alertness: Awake/alert Behavior During Therapy: WFL for tasks assessed/performed Overall Cognitive Status: Impaired/Different from baseline Area of Impairment: Memory, Awareness Memory: Decreased short-term memory Safety/Judgement: Decreased awareness of safety, Decreased awareness of deficits Awareness: Emergent General Comments: Pt needs reinforcement of previously discussed precautions and physical progression expectations (he has some slightly unrealistic expectations of how quickly he will recover).  Physical Exam: Blood pressure (!) 142/81, pulse 62, temperature 98.4 F (36.9 C), temperature source Oral, resp. rate 17, height 5\' 8"  (1.727 m), weight 70.9 kg (156 lb 4.9 oz), SpO2 99 %. Physical Exam Constitutional: He is oriented to person, place, and time. He appears well-developed and well-nourished.  HENT:  Head: Normocephalic.  Right Ear: External ear normal.  Left Ear: External ear normal.  Eyes: Conjunctivae and EOM are normal.  Neck:  Cervical collar in place  Cardiovascular: Normal rate and regular rhythm.  no murmurs or rubs Respiratory: Effort normal and breath sounds normal. No respiratory distress. No wheezes or rhonchi GI: Soft. Bowel sounds are normal. He exhibits no distension. Non tender. Musculoskeletal: He exhibits no edema or tenderness.    Neurological: He is alert and oriented to person, place, and time.  Sensation diminished to light touch right hand and foot but slight DTRs symmetric Motor: RUE: 4/5 deltoid, bicep, tricep, wrist and HI 3+. RLE 3+ to 4-/5 HF, KE and 3+ ADF/PF. LUE: 4/5 prox to distal and LLE 4/5 prox to distal Decreased FMC RUE and RLE.   Skin: Skin is warm and dry.  Surgical site is dressed with drain in place  Psychiatric: He has a normal mood and affect. His behavior is normal   Results for orders placed or performed during the hospital encounter of 02/05/16 (from the past 48 hour(s))  Glucose, capillary     Status: Abnormal   Collection Time: 02/08/16  9:29 PM  Result Value Ref Range   Glucose-Capillary 247 (H) 65 - 99 mg/dL  Glucose, capillary     Status: Abnormal   Collection Time: 02/09/16  8:41 AM  Result Value Ref Range   Glucose-Capillary 174 (H) 65 - 99 mg/dL   Comment 1 Notify RN    Comment 2 Document in Chart   Glucose, capillary     Status: Abnormal   Collection Time: 02/09/16 11:47 AM  Result Value Ref Range   Glucose-Capillary 192 (H) 65 - 99 mg/dL   Comment 1 Notify RN    Comment 2 Document in Chart   Glucose, capillary     Status: Abnormal   Collection Time: 02/09/16  4:34 PM  Result Value Ref Range   Glucose-Capillary 181 (H) 65 - 99 mg/dL   Comment 1 Notify RN    Comment 2 Document in Chart   Glucose, capillary     Status: Abnormal   Collection Time: 02/09/16  9:28 PM  Result Value Ref Range   Glucose-Capillary 196 (H) 65 - 99 mg/dL  Glucose, capillary     Status: Abnormal   Collection Time: 02/10/16  7:59 AM  Result Value Ref Range   Glucose-Capillary 186 (H) 65 - 99 mg/dL  Glucose, capillary     Status: Abnormal   Collection Time: 02/10/16 12:27 PM  Result Value Ref Range   Glucose-Capillary 158 (H) 65 - 99 mg/dL   No results found.     Medical Problem List and Plan: 1. 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 Status post ACDF 01/16/2016  with postoperative epidural hematoma status post exploration and fusion removal of plate and bone graft 02/08/2016 followed by posterior cervical decompression 02/08/2016. Cervical collar at all times  -admit to inpatient rehab 2.  DVT Prophylaxis/Anticoagulation: SCDs. check vascular study 3. Pain Management: Tylenol as needed.   -denies any pain at present even at op site 4. Diabetes mellitus. Hemoglobin A1c 6.4. Glucophage 500 mg daily. Check blood sugars before meals and at bedtime 5. Neuropsych: This patient is capable of making decisions on his own behalf. 6. Skin/Wound Care: Routine skin checks 7. Fluids/Electrolytes/Nutrition: Routine I&O's with follow-up chemistry 8. Dysphagia. Dysphagia #1 thin liquid diet  -follow up per SLP 9. Hypertension. Lisinopril 2.5 mg daily. Monitor with increased mobility 10. Hypothyroidism. Continue Synthroid. 11. Hyperlipidemia. Lipitor   Post Admission Physician Evaluation: 1. Functional deficits secondary  to tetraplegia related to cervical myelopathy. 2. Patient is admitted to receive collaborative, interdisciplinary care between the physiatrist, rehab nursing staff, and therapy team. 3. Patient's level of medical complexity and substantial therapy needs in context of that medical necessity cannot be provided at a lesser intensity of care such as a SNF. 4. Patient has experienced substantial functional loss from his/her baseline which was documented above under the "Functional History" and "Functional Status" headings.  Judging by the patient's diagnosis, physical exam, and functional history, the patient has potential for functional progress which will result in measurable gains while on inpatient rehab.  These gains will be of substantial and practical use upon discharge  in facilitating mobility and self-care at the household level. 5. Physiatrist will provide 24 hour management of medical needs as well as oversight of the therapy plan/treatment and  provide guidance as appropriate regarding the interaction of the two. 6. 24 hour rehab nursing will assist with bladder management, bowel management, safety, skin/wound care, disease management, medication administration, pain management and patient education  and help integrate therapy concepts, techniques,education, etc. 7. PT will assess and treat for/with: Lower extremity strength, range of motion, stamina, balance, functional mobility, safety, adaptive techniques and equipment, NMR, community reintegration, fall-risk assessment, education.   Goals are: mod I. 8. OT will assess and treat for/with: ADL's, functional mobility, safety, upper extremity strength, adaptive techniques and equipment, NMR, education, community reintegration.   Goals are: mod I. Therapy may proceed with showering this patient. 9. SLP will assess and treat for/with: swallowing.  Goals are: mod I/advance diet. 10. Case Management and Social Worker will assess and treat for psychological issues and discharge planning. 11. Team conference will be held weekly to assess progress toward goals and to determine barriers to discharge. 12. Patient will receive at  least 3 hours of therapy per day at least 5 days per week. 13. ELOS: 11-16 days       14. Prognosis:  excellent     Meredith Staggers, MD, [facility] 02/10/2016  02/10/2016

## 2016-02-10 NOTE — Progress Notes (Signed)
Ankit Lorie Phenix, MD Physician Signed Physical Medicine and Rehabilitation  Consult Note Date of Service: 02/07/2016 5:46 AM  Related encounter: Admission (Discharged) from 02/05/2016 in [facility]     Expand All Collapse All   [] Hide copied text [] Hover for attribution information      Physical Medicine and Rehabilitation Consult Reason for Consult: Quadriparesis Referring Physician: Dr. [name]   HPI: Gary Craig is a 82 y.o. right handed male with history of diabetes mellitus, CAD status post CABG, CVA maintained on  Plavix. Per chart review patient lives with wife 2 level home with bedroom on main floor. 3 steps to entry. Independent prior to admission and active prior to initial ACDF. 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 [name] and discharged to home 01/16/2016. Patient developed neck pain and numbness sensation in the hands with progressive quadriparesis. MRI of the cervical spine showed abnormal fluid collection projecting dorsally from the C4-5 interspace. Mild peripheral enhancement. Mild cord flattening and effacement of both C5 nerve roots. Mild edema in the C4 and C5 vertebral bodies. Underwent exploration of C4-5 fusion. Removal of the plate and the bone graft. Evacuation of epidural hematoma. Insertion of a new allograft 9 mm height plate 02/06/2016 per Dr. [name]. Hospital course pain management. Cervical collar was placed. Decadron protocol as indicated. Currently on a dysphagia #1 thin liquid diet. Physical therapy evaluation completed 02/06/2016 with recommendations of physical medicine rehabilitation consult.   Review of Systems  Constitutional: Negative for chills and fever.  HENT: Negative for hearing loss.   Eyes: Negative for blurred vision and double vision.  Respiratory: Negative for cough and shortness of breath.   Cardiovascular:  Negative for palpitations and leg swelling.  Gastrointestinal: Positive for constipation. Negative for nausea and vomiting.       GERD  Genitourinary: Positive for urgency. Negative for dysuria and hematuria.  Musculoskeletal: Positive for neck pain.  Skin: Negative for rash.  Neurological: Positive for sensory change, focal weakness and weakness. Negative for seizures and headaches.       Numbness upper extremities  All other systems reviewed and are negative.      Past Medical History:  Diagnosis Date  . CAD (coronary artery disease)   . Carotid stenosis   . Diabetes mellitus without complication ([place])   . GERD (gastroesophageal reflux disease)    zantac as needed  . High cholesterol   . History of kidney stones   . Hypothyroidism   . S/P CABG (coronary artery bypass graft)   . Skin cancer   . Stroke [facility]) 2012   tia        Past Surgical History:  Procedure Laterality Date  . ANTERIOR CERVICAL DECOMP/DISCECTOMY FUSION N/A 01/16/2016   Procedure: Cervical Four-Five Anterior cervical decompression/diskectomy/fusion;  Surgeon: Kary Kos, MD;  Location: [facility];  Service: Neurosurgery;  Laterality: N/A;  right side approach  . CERVICAL WOUND DEBRIDEMENT N/A 02/05/2016   Procedure: Exploration of Cervical four - five wound. Removal of cervical four - five instrumentation. Insertion of new cervical four - five instrumentation.;  Surgeon: Leeroy Cha, MD;  Location: [facility];  Service: Neurosurgery;  Laterality: N/A;  Exploration of Cervical four - five wound. Removal of cervical four - five instrumentation. Insertion of new cervical four - five instrumentatio  . CORONARY ARTERY BYPASS GRAFT  11/29/2004  . EAR BIOPSY     skin ca  .  LITHOTRIPSY Right   . TONSILLECTOMY          Family History  Problem Relation Age of Onset  . CAD Brother   . Heart block Brother    Social History:  reports that he quit smoking about 33 years ago. His smoking  use included Cigarettes, Pipe, and Cigars. He smoked 1.00 pack per day. He has never used smokeless tobacco. He reports that he drinks alcohol. He reports that he does not use drugs. Allergies:      Allergies  Allergen Reactions  . No Known Allergies          Medications Prior to Admission  Medication Sig Dispense Refill  . atorvastatin (LIPITOR) 40 MG tablet Take 1 tablet by mouth at bedtime.     . clopidogrel (PLAVIX) 75 MG tablet Take 1 tablet (75 mg total) by mouth daily. (Patient taking differently: Take 75 mg by mouth every morning. ) 30 tablet 0  . Glucosamine-Chondroitin 500-400 MG CAPS Take 1 tablet by mouth every morning.     Marland Kitchen levothyroxine (SYNTHROID, LEVOTHROID) 112 MCG tablet Take 112 mcg by mouth daily before breakfast.     . lisinopril (PRINIVIL,ZESTRIL) 2.5 MG tablet Take 2.5 mg by mouth every morning.     . metFORMIN (GLUCOPHAGE-XR) 500 MG 24 hr tablet Take 500 mg by mouth daily with breakfast.    . Multiple Vitamin (MULTIVITAMIN WITH MINERALS) TABS tablet Take 1 tablet by mouth every morning.     Marland Kitchen omega-3 acid ethyl esters (LOVAZA) 1 g capsule Take 1 g by mouth 2 (two) times daily.    Marland Kitchen oxyCODONE-acetaminophen (PERCOCET/ROXICET) 5-325 MG tablet Take 1 tablet by mouth every 6 (six) hours as needed for pain.    . ranitidine (ZANTAC) 75 MG tablet Take 75 mg by mouth daily as needed for heartburn.       Home: Home Living Family/patient expects to be discharged to:: Private residence Living Arrangements: Spouse/significant other Available Help at Discharge: Family, Available 24 hours/day Type of Home: House Home Access: Stairs to enter [employer] of Steps: 3 Entrance Stairs-Rails: Right Home Layout: Two level, Able to live on main level with bedroom/bathroom (his office is upstairs) Alternate Level Stairs-Number of Steps: flight Bathroom Shower/Tub: Chiropodist: Standard Home Equipment: Grab bars - tub/shower,  Cane - single point  Functional History: Prior Function Level of Independence: Independent Comments: walk 5-6 miles Monday through friday for 40 years and play golf twice a week.  Functional Status:  Mobility: Bed Mobility Overal bed mobility: Needs Assistance Bed Mobility: Rolling, Sidelying to Sit Rolling: +2 for safety/equipment, Mod assist Sidelying to sit: +2 for physical assistance, Mod assist General bed mobility comments: Two person mod assist to help pt flex knees and support trunk to preform log roll to left side.  Assist needed to progress bil legs over EOB (especially right leg), and to support trunk to transition up to left elbow and then all the way up to sitting EOB.   Transfers Overall transfer level: Needs assistance Equipment used: None Transfers: Sit to/from Stand Sit to Stand: +2 physical assistance, Mod assist General transfer comment: Pt needed support at trunk to power up over weak and buckling knees.  Pt able to lock knees out and stand with better knee, hip and trunk extension with mod two person assist.  Pt was able to take a few shuffling steps over to the recliner chair with the support and guarding of buckling knees bil.  Pt with difficulty moving  and progressing feet over to take steps.  Ambulation/Gait General Gait Details: unable at this time, I would like to try [facility] walker next session.      ADL: ADL Overall ADL's : Needs assistance/impaired Eating/Feeding: NPO Grooming: Oral care, Wash/dry face, Moderate assistance, Sitting, Minimal assistance Grooming Details (indicate cue type and reason): pt able to hold wash cloth iwth L hand to wash face. pt needed (A) hand over hand to complete oral care. pt; unable to sustain grasp on tooth brush even with red foam General ADL Comments: Pt completed sit<.stand from chair x2 during session and attempting to shift weight to step with alternating bil LE. pt able to shift weight with (A)> Pt reports putting weight on  LLE due to feelign like R LE was going to buckle. Pt attempting to step with R LE and then L LE and bil LE buckled. pt fatigues and lack of awareness to fatigue. pt very motivated to return to movement. pt wallks daily for years and very emotional about currently situation.  Cognition: Cognition Overall Cognitive Status: Impaired/Different from baseline Orientation Level: Oriented X4 Cognition Arousal/Alertness: Awake/alert Behavior During Therapy: Impulsive Overall Cognitive Status: Impaired/Different from baseline Area of Impairment: Safety/judgement Safety/Judgement: Decreased awareness of safety General Comments: Pt impulsive and quick to move throughout session.    Blood pressure 138/65, pulse 70, temperature 98.5 F (36.9 C), temperature source Oral, resp. rate 20, height 5\' 8"  (1.727 m), weight 70 kg (154 lb 6.4 oz), SpO2 98 %. Physical Exam  Vitals reviewed. Constitutional: He is oriented to person, place, and time. He appears well-developed and well-nourished.  HENT:  Head: Normocephalic.  Right Ear: External ear normal.  Left Ear: External ear normal.  Eyes: Conjunctivae and EOM are normal.  Neck:  Cervical collar in place  Cardiovascular: Normal rate and regular rhythm.   Respiratory: Effort normal and breath sounds normal. No respiratory distress.  GI: Soft. Bowel sounds are normal. He exhibits no distension.  Musculoskeletal: He exhibits no edema or tenderness.  Neurological: He is alert and oriented to person, place, and time.  Sensation diminished to light touch right hand and foot DTRs symmetric Motor: RUE: shoulder abduction, elbow flexion/extension 5/5, hand grip 3/5 LUE: shoulder abduction, elbow flexion/extension 5/5, hand grip 4+/5 LLE: Hip flexion, knee extension 4+/5, ankle dorsi/plantar flexion 5/5 RLE: Hip flexion 2+/5, knee extension 3/5, ankle dorsi/plantar flexion 5/5 Decreased FMC >right  Skin: Skin is warm and dry.  Surgical site is dressed with  drain in place  Psychiatric: He has a normal mood and affect. His behavior is normal.    Lab Results Last 24 Hours       Results for orders placed or performed during the hospital encounter of 02/05/16 (from the past 24 hour(s))  Glucose, capillary     Status: Abnormal   Collection Time: 02/06/16  8:06 AM  Result Value Ref Range   Glucose-Capillary 197 (H) 65 - 99 mg/dL   Comment 1 Notify RN    Comment 2 Document in Chart   Glucose, capillary     Status: Abnormal   Collection Time: 02/06/16 12:44 PM  Result Value Ref Range   Glucose-Capillary 213 (H) 65 - 99 mg/dL   Comment 1 Notify RN    Comment 2 Document in Chart   Glucose, capillary     Status: Abnormal   Collection Time: 02/06/16  4:43 PM  Result Value Ref Range   Glucose-Capillary 205 (H) 65 - 99 mg/dL  Glucose, capillary  Status: Abnormal   Collection Time: 02/06/16 11:05 PM  Result Value Ref Range   Glucose-Capillary 210 (H) 65 - 99 mg/dL      Imaging Results (Last 48 hours)  Dg Cervical Spine 2-3 Views  Result Date: 02/05/2016 CLINICAL DATA:  Surgery at C4-C5. EXAM: CERVICAL SPINE - 2-3 VIEW COMPARISON:  Intraoperative fluoroscopic spot views 01/06/2016 FINDINGS: Two lateral views of the cervical spine demonstrate surgical instruments anterior to C4-C5. Images labeled #1 demonstrates anterior fusion plate at [id number] with no fixating screws. An interbody spacer is in place. Image labeled #2 demonstrates placement of fixating screws. The patient is intubated. IMPRESSION: Lateral spot views of the cervical spine localizing to C4-C5. Electronically Signed   By: Jeb Levering M.D.   On: 02/05/2016 21:09   Mr Cervical Spine W Wo Contrast  Result Date: 02/05/2016 CLINICAL DATA:  Cervical spine fusion surgery July 31st, now with numbness and weakness in all extremities, worse over the past 2 days. Patient developed a shooting pain in the neck down the back and into the legs 1 week ago, which was  temporarily improved with a steroid taper. EXAM: MRI CERVICAL SPINE WITHOUT AND WITH CONTRAST TECHNIQUE: Multiplanar and multiecho pulse sequences of the cervical spine, to include the craniocervical junction and cervicothoracic junction, were obtained according to standard protocol without and with intravenous contrast. CONTRAST:  3mL MULTIHANCE GADOBENATE DIMEGLUMINE 529 MG/ML IV SOLN COMPARISON:  Preoperative MRI 12/16/2015. Intraoperative spot radiograph 01/16/2016. FINDINGS: Alignment: Anatomic. Vertebrae: Susceptibility artifact related to C4-5 ACDF with plate and screw fixation. Some edema and enhancement of the C4 and C5 vertebral bodies, not unexpected given recent surgery. Cord: Cord compression at the C4-5 level, related to a contained fluid collection projecting posteriorly from the interspace. No definite abnormal cord signal. Canal AP diameter approximately 7 mm. Posterior Fossa: Unremarkable Vertebral Arteries: Flow voids are equal bilaterally. Paraspinal tissues: No significant fluid collection in the anterior neck. Disc levels: The individual disc spaces were examined as follows: C2-3: BILATERAL facet arthropathy with ligamentum flavum hypertrophy and infolding. Osseous spurring, worse on the LEFT, with mild foraminal narrowing. C3-4: Moderate stenosis related to short pedicles, posterior element hypertrophy, and osseous ridging. Suspected anterior spurring contributes to functional arthrodesis. No definite foraminal narrowing. C4-5: Contained fluid collection projects posteriorly from the interspace. Approximate cross-sectional measurements are 20 x 9 x 8 mm. Interbody cage appears to remain centered within the C4-5 disc space. Small amount of T2 hyperintense fluid projects slightly anterior and rightward to the cage, see image 15 series 6. Posterior margin of the fluid collection demonstrates postcontrast enhancement. Mild effect on the cord and C5 nerve roots, greater on the LEFT. Ligamentum  flavum infolding contributes to stenosis, estimated 7 mm AP diameter. C5-6:  Annular bulge.  No impingement. C6-7:  Facet degenerative change, annular bulge.  No impingement. C7-T1:  Unremarkable. IMPRESSION: Abnormal fluid collection projecting dorsally from the C4-5 interspace, status post C4-5 ACDF on 01/16/2016. Mild peripheral enhancement. Mild cord flattening and effacement of both C5 nerve roots. No definite abnormal cord signal. Postoperative seroma is favored. Abscess not excluded. Mild edema in the C4 and C5 vertebral bodies, not necessarily unexpected given recent surgery. Consider noncontrast CT of the cervical spine to assess for hardware integrity and/or cage subsidence. Electronically Signed   By: Staci Righter M.D.   On: 02/05/2016 11:23     Assessment/Plan: Diagnosis:  Cervical spondylitic myelopathy Labs and images independently reviewed.  Records reviewed and summated above.     Respiratory: encourage early  use of incentive spirometry as tolerated,     assisted cough and deep breathing techniques. Chest physiotherapy if no     contraindications. May consider use of abdominal binder for better     diaphragmatic excursion.      Skin: daily skin checks, turn q2 (care with the spine), PRAFO, continue use pressure relieving mattress      Cardiovascular: anticipate orthostasis when OOB. May use abdominal     binder, TEDs or ace wraps to BLE for this. If ineffective, consider salt     tabs, midodrine or fludrocortisone.       Psych: psychology consult for adjustment to disability for pt and family     Pain Management:  control with oral medications if possible     Bladder:  Possible development of neurogenic bladder as when spasticity starts to develop. May confirm this with serial PVRs to r/o retention/atonic bladder. In/out clean catherization. Implement bladder program . Encourage self I&O cath training vs     indwelling foley if possible to improve mobility, reduce infection, and      increase safety     Bowel: Stress ulcer ppx..  Implement mechanical and chemical bowel program and care     training with scheduled suppository 30 min to 1 hour after meals to utilize     gastrocolic and colorectal reflexes.    1. Does the need for close, 24 hr/day medical supervision in concert with the patient's rehab needs make it unreasonable for this patient to be served in a less intensive setting? Yes 2. Co-Morbidities requiring supervision/potential complications: diabetes mellitus (Monitor in accordance with exercise and adjust meds as necessary), CAD status post CABG (cont meds), hx of CVA, pain management (Biofeedback training with therapies to help reduce reliance on opiate pain medications, monitor pain control during therapies, and sedation at rest and titrate to maximum efficacy to ensure participation and gains in therapies), Dysphagia (cont SLP, advance diet as tolerated), leukocytosis (cont to monitor for signs and symptoms of infection, further workup if indicated), tachypnea (monitor RR and O2 Sats with increased physical exertion) 3. Due to bladder management, bowel management, safety, skin/wound care, disease management, pain management and patient education, does the patient require 24 hr/day rehab nursing? Yes 4. Does the patient require coordinated care of a physician, rehab nurse, PT (1-2 hrs/day, 5 days/week), OT (1-2 hrs/day, 5 days/week) and SLP (1-2 hrs/day, 5 days/week) to address physical and functional deficits in the context of the above medical diagnosis(es)? Yes Addressing deficits in the following areas: balance, endurance, locomotion, strength, transferring, bowel/bladder control, bathing, dressing, feeding, toileting and psychosocial support 5. Can the patient actively participate in an intensive therapy program of at least 3 hrs of therapy per day at least 5 days per week? Potentially 6. The potential for patient to make measurable gains while on inpatient rehab is  excellent 7. Anticipated functional outcomes upon discharge from inpatient rehab are supervision and min assist  with PT, supervision and min assist with OT, independent and modified independent with SLP. 8. Estimated rehab length of stay to reach the above functional goals is: 16-19 days. 9. Does the patient have adequate social supports and living environment to accommodate these discharge functional goals? Potentially 10. Anticipated D/C setting: Home 11. Anticipated post D/C treatments: HH therapy and Home excercise program 12. Overall Rehab/Functional Prognosis: good  RECOMMENDATIONS: This patient's condition is appropriate for continued rehabilitative care in the following setting: Likely CIR, however, will need to reevaluate after surgery tomorrow to determine  deficits and potential functional prognosis. Patient has agreed to participate in recommended program. Yes Note that insurance prior authorization may be required for reimbursement for recommended care.  Comment: Rehab Admissions Coordinator to follow up.  Delice Lesch, MD 02/07/2016    Revision History                   Routing History

## 2016-02-10 NOTE — Progress Notes (Signed)
Retta Diones, RN Rehab Admission Coordinator Signed Physical Medicine and Rehabilitation  PMR Pre-admission Date of Service: 02/09/2016 1:38 PM  Related encounter: Admission (Discharged) from 02/05/2016 in [facility]       [] Hide copied text PMR Admission Coordinator Pre-Admission Assessment  Patient: Gary Craig is an 82 y.o., male MRN: [id number] DOB: [birth year] Height: 5\' 8"  (172.7 cm) Weight: 70.9 kg (156 lb 4.9 oz)                                                                                                                                                                                                                                                                          Insurance Information HMO: No   PPO:       PCP:       IPA:       80/20:       OTHER:   PRIMARY:  Medicare A/B      Policy#: [id number]      Subscriber: Jacquiline Doe CM Name:        Phone#:       Fax#:   Pre-Cert#:        Employer:  Retired Benefits:  Phone #:       Name: Checked in [place]. Date: 02/16/01     Deduct: $1316      Out of Pocket Max: none      Life Max: unlimited CIR: 100%      SNF: 100 days Outpatient: 80%     Co-Pay: 20% Home Health: 100%      Co-Pay: none DME: 80%     Co-Pay: 20% Providers: patient's choice  SECONDARY: Tricare      Policy#: [id number]      Subscriber: Jacquiline Doe CM Name:        Phone#:       Fax#:   Pre-Cert#:        Employer: Retired Benefits:  Phone #: [phone]     Name:   Eff. Date:       Deduct:        Out of Pocket Max:        Life Max:   CIR:  SNF:   Outpatient:       Co-Pay:   Home Health:        Co-Pay:   DME:       Co-Pay:    Emergency Contact Information        Contact Information    Name Relation Home Work Huetter [phone]  [phone]   Reder,Cathy Daughter   [phone]     Current Medical History  Patient Admitting Diagnosis: Cervical spondylitic  myelopathy  History of Present Illness:  A 82 y.o.right handed malewith history of diabetes mellitus, CAD status post CABG, CVA maintained on Plavix. Per chart review patient lives with wife 2 level home with bedroom on main floor. 3 steps to entry. Independent prior to admission and active prior to initial ACDF. 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 [name] and discharged to home 01/16/2016. Patient developed neck pain and numbness sensation in the hands with progressive quadriparesis. MRI of the cervical spine showed abnormal fluid collection projecting dorsally from the C4-5 interspace. Mild peripheral enhancement. Mild cord flattening and effacement of both C5 nerve roots. Mild edema in the C4 and C5 vertebral bodies. Underwent exploration of C4-5 fusion. Removal of the plate and the bone graft. Evacuation of epidural hematoma. Insertion of a new allograft 9 mm height plate 02/06/2016 per Dr. Alexander Mt by posterior cervical decompression 02/08/2016 per Dr. [name]. [facility] course pain management. Cervical collar was placed. Decadron protocol as indicated. Currently on a dysphagia #1 thin liquid diet. Physical therapy evaluation completed 02/06/2016 with recommendations of physical medicine rehabilitation consult.  Past Medical History      Past Medical History:  Diagnosis Date  . CAD (coronary artery disease)   . Carotid stenosis   . Diabetes mellitus without complication ([place])   . GERD (gastroesophageal reflux disease)    zantac as needed  . High cholesterol   . History of kidney stones   . Hypothyroidism   . S/P CABG (coronary artery bypass graft)   . Skin cancer   . Stroke [facility]) 2012   tia    Family History  family history includes CAD in his brother; Heart block in his brother.  Prior Rehab/Hospitalizations: Recent surgery 01/16/16 and more surgery 02/05/16  Has the patient  had major surgery during 100 days prior to admission? Yes.  Had cervical neck surgery 01/16/16.  Current Medications   Current Facility-Administered Medications:  .  0.9 %  sodium chloride infusion, 250 mL, Intravenous, Continuous, Leeroy Cha, MD .  0.9 %  sodium chloride infusion, , Intravenous, Continuous, Leeroy Cha, MD, Stopped at 02/09/16 1000 .  0.9 %  sodium chloride infusion, 250 mL, Intravenous, Continuous, Kary Kos, MD .  acetaminophen (TYLENOL) tablet 650 mg, 650 mg, Oral, Q4H PRN **OR** acetaminophen (TYLENOL) suppository 650 mg, 650 mg, Rectal, Q4H PRN, Leeroy Cha, MD .  atorvastatin (LIPITOR) tablet 40 mg, 40 mg, Oral, QHS, Leeroy Cha, MD, 40 mg at 02/09/16 2100 .  bisacodyl (DULCOLAX) suppository 10 mg, 10 mg, Rectal, Daily PRN, Leeroy Cha, MD, 10 mg at 02/07/16 1546 .  cyclobenzaprine (FLEXERIL) tablet 10 mg, 10 mg, Oral, TID PRN, Kary Kos, MD .  dexamethasone (DECADRON) injection 4 mg, 4 mg, Intravenous, Q6H, 4 mg at 02/10/16 1254 **OR** dexamethasone (DECADRON) tablet 4 mg, 4 mg, Oral, Q6H, Leeroy Cha, MD, 4 mg at 02/07/16 2317 .  docusate (COLACE) 50 MG/5ML liquid 100 mg, 100 mg, Oral, BID, [name]  [name], MD, 100 mg at 02/10/16 0926 .  famotidine (PEPCID) tablet 40 mg, 40 mg, Oral, BID, Kary Kos, MD, 40 mg at 02/10/16 0925 .  HYDROmorphone (DILAUDID) injection 0.5-1 mg, 0.5-1 mg, Intravenous, Q2H PRN, Kary Kos, MD .  insulin aspart (novoLOG) injection 0-5 Units, 0-5 Units, Subcutaneous, QHS, Leeroy Cha, MD, 2 Units at 02/08/16 2131 .  insulin aspart (novoLOG) injection 0-9 Units, 0-9 Units, Subcutaneous, TID WC, Leeroy Cha, MD, 2 Units at 02/10/16 1253 .  levothyroxine (SYNTHROID, LEVOTHROID) tablet 112 mcg, 112 mcg, Oral, QAC breakfast, Leeroy Cha, MD, 112 mcg at 02/10/16 [phone] .  lisinopril (PRINIVIL,ZESTRIL) tablet 2.5 mg, 2.5 mg, Oral, BH-q7a, Leeroy Cha, MD, 2.5 mg at 02/10/16 [phone] .  menthol-cetylpyridinium (CEPACOL) lozenge 3 mg,  1 lozenge, Oral, PRN **OR** phenol (CHLORASEPTIC) mouth spray 1 spray, 1 spray, Mouth/Throat, PRN, Leeroy Cha, MD .  metFORMIN (GLUCOPHAGE-XR) 24 hr tablet 500 mg, 500 mg, Oral, Q breakfast, Leeroy Cha, MD, 500 mg at 02/10/16 0925 .  morphine 2 MG/ML injection 1-4 mg, 1-4 mg, Intravenous, Q3H PRN, Leeroy Cha, MD, 2 mg at 02/09/16 2100 .  ondansetron (ZOFRAN) injection 4 mg, 4 mg, Intravenous, Q4H PRN, Leeroy Cha, MD, 4 mg at 02/08/16 1639 .  oxyCODONE-acetaminophen (PERCOCET/ROXICET) 5-325 MG per tablet 1-2 tablet, 1-2 tablet, Oral, Q4H PRN, Kary Kos, MD, 1 tablet at 02/09/16 0631 .  senna-docusate (Senokot-S) tablet 1 tablet, 1 tablet, Oral, QHS PRN, Kary Kos, MD .  sodium chloride flush (NS) 0.9 % injection 3 mL, 3 mL, Intravenous, Q12H, Leeroy Cha, MD, 3 mL at 02/09/16 2200 .  sodium chloride flush (NS) 0.9 % injection 3 mL, 3 mL, Intravenous, PRN, Leeroy Cha, MD .  sodium chloride flush (NS) 0.9 % injection 3 mL, 3 mL, Intravenous, Q12H, Kary Kos, MD, 3 mL at 02/09/16 2119 .  sodium chloride flush (NS) 0.9 % injection 3 mL, 3 mL, Intravenous, PRN, Kary Kos, MD  Patients Current Diet: DIET - DYS 1 Room service appropriate? Yes; Fluid consistency: Thin  Precautions / Restrictions Precautions Precautions: Fall, Cervical Precaution Comments: reviewed log roll, limited lifting with bil uppers Cervical Brace: Hard collar Restrictions Weight Bearing Restrictions: No   Has the patient had 2 or more falls or a fall with injury in the past year?Yes.  Has had 3 falls.  Hit is forehead and had to go to hospital ED for treatment.  Prior Activity Level Community (5-7x/wk): Went out daily.  Played golf, walked, worked in his garden.  Was driving.  Home Assistive Devices / Equipment Home Assistive Devices/Equipment: Eyeglasses, CBG Meter Home Equipment: Grab bars - tub/shower, Cane - single point  Prior Device Use: Indicate devices/aids used by the patient prior to  current illness, exacerbation or injury? None  Prior Functional Level Prior Function Level of Independence: Independent Comments: walk 5-6 miles Monday through friday for 40 years and play golf twice a week.   Self Care: Did the patient need help bathing, dressing, using the toilet or eating?  Independent  Indoor Mobility: Did the patient need assistance with walking from room to room (with or without device)? Independent  Stairs: Did the patient need assistance with internal or external stairs (with or without device)? Independent  Functional Cognition: Did the patient need help planning regular tasks such as shopping or remembering to take medications? Independent  Current Functional Level Cognition Overall Cognitive Status: Impaired/Different from baseline Orientation Level: Oriented X4 Safety/Judgement: Decreased awareness of safety, Decreased awareness of deficits General Comments: Pt needs reinforcement  of previously discussed precautions and physical progression expectations (he has some slightly unrealistic expectations of how quickly he will recover).    Extremity Assessment (includes Sensation/Coordination) Upper Extremity Assessment: Defer to OT evaluation RUE Deficits / Details: brunstrom II grasp, shoulder flexion 90 degrees, wrist extension present but limited due to IV location for testing. pt with decr digit extension RUE Sensation: decreased light touch RUE Coordination: decreased fine motor, decreased gross motor LUE Deficits / Details: brunstrom III grasp pt able to hold objects against gravity. pt shoulder flexion 90 degrees and able to sustain against gravity wrist extension 3 out 5 LUE Sensation: decreased light touch LUE Coordination: decreased fine motor, decreased gross motor  Lower Extremity Assessment: RLE deficits/detail, LLE deficits/detail RLE Deficits / Details: right leg is weaker than left leg.  Right ankle 2+/5, right knee 3-/5, right hip  2+/5 RLE Sensation: decreased light touch RLE Coordination: decreased fine motor, decreased gross motor LLE Deficits / Details: left leg stronger than right leg, ankle 3/5, knee 3-/5, hip 3-/5 LLE Sensation: decreased light touch LLE Coordination: decreased fine motor, decreased gross motor   ADLs Overall ADL's : Needs assistance/impaired Eating/Feeding: Set up, Bed level, With adaptive utensils Eating/Feeding Details (indicate cue type and reason): Set up with use of red foam for self feeding. Pt able to grip coffee cup to take sips through straw. Grooming: Wash/dry face, Minimal assistance, Sitting Grooming Details (indicate cue type and reason): required cues to use R hand and not allow L hand to complete the task Upper Body Dressing : Moderate assistance, Sitting Upper Body Dressing Details (indicate cue type and reason): to don hospital gown Toilet Transfer: Moderate assistance, +2 for physical assistance, Stand-pivot, BSC Toilet Transfer Details (indicate cue type and reason): Simulated by stand pivot from EOB to chair Functional mobility during ADLs: Moderate assistance, +2 for physical assistance (for stand pivot only) General ADL Comments: Pt very motivated to participate in therapy today. Demonstrating bil UE exercises to OT this session. Encouraged continued ROM and independence with functional activities.   Mobility Overal bed mobility: Needs Assistance Bed Mobility: Rolling, Sidelying to Sit Rolling: Mod assist Sidelying to sit: +2 for physical assistance, Mod assist, HOB elevated Supine to sit: Max assist, HOB elevated General bed mobility comments: Assist needed to help flex bil legs to prepare for log roll, reach for left railing and support legs and trunk to get to sitting from side lying.     Transfers Overall transfer level: Needs assistance Equipment used: 2 person hand held assist Transfer via Lift Equipment:  (EVA stedy) Transfers: Sit to/from Stand, [employer]  Transfers Sit to Stand: +2 physical assistance, Min assist Stand pivot transfers: +2 physical assistance, Min assist General transfer comment: Two person min assist to support trunk during transition to stand from elevated bed.  Knees blocked for safety, but no significant buckling noted even when side stepping towards chair.  Pt needs cues for safe slow descent into recliner using arms to help control speed.  Recliner chair built up to help him get out of it later with RN staff.    Ambulation / Gait / Stairs / Wheelchair Mobility Ambulation/Gait Ambulation/Gait assistance: Mod assist, +2 physical assistance, +2 safety/equipment Ambulation Distance (Feet): 50 Feet Assistive device:  (EVA walker) Gait Pattern/deviations: Step-through pattern General Gait Details: NT today, but will likely be ready to progress to gait tomorrow.  Gait velocity: decreased Gait velocity interpretation: Below normal speed for age/gender   Posture / Balance Dynamic Sitting Balance Sitting balance -  Comments: min guard to supervision at times.  posterior preference.  Did better once scooted out with feet firmly planted on floor.   Worked on sitting posture with head over shoulders.  Practiced shoulder shrugs, scap retractions  Balance Overall balance assessment: Needs assistance Sitting-balance support: Feet supported, Bilateral upper extremity supported Sitting balance-Leahy Scale: Poor Sitting balance - Comments: min guard to supervision at times.  posterior preference.  Did better once scooted out with feet firmly planted on floor.   Worked on sitting posture with head over shoulders.  Practiced shoulder shrugs, scap retractions  Postural control: Posterior lean Standing balance support: Bilateral upper extremity supported Standing balance-Leahy Scale: Poor Standing balance comment: needs external support to stand, but less than in previous sessions.  Worked on upright standing posture without extending trunk  backwards (sway back).     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  Voiding without incontinence. Diabetic mgmt Yes, on metformin at home orally, but on insulin in hospital   Previous Home Environment Living Arrangements: Spouse/significant other Available Help at Discharge: Family, Available 24 hours/day Type of Home: House Home Layout: Two level, Able to live on main level with bedroom/bathroom (his office is upstairs) Alternate Level Stairs-Number of Steps: flight Home Access: Stairs to enter Entrance Stairs-Rails: Right Entrance Stairs-Number of Steps: 3 Bathroom Shower/Tub: Chiropodist: [place]: No  Discharge Living Setting Plans for Discharge Living Setting: Patient's home, House, Lives with (comment) (Lives with wife.) Type of Home at Discharge: House Discharge Home Layout: Two level, Able to live on main level with bedroom/bathroom Alternate Level Stairs-Number of Steps: Flight Discharge Home Access: Stairs to enter Technical brewer of Steps: 3 steps at deck entry and 4 steps at garage entry Does the patient have any problems obtaining your medications?: No  Social/Family/Support Systems Patient Roles: Spouse, Parent (Has a wife and 2 daughters.) Contact Information: Izyan Magstadt - wife Anticipated Caregiver: wife Anticipated Caregiver's Contact Information: [name] - wife - (h) [phone] Ability/Limitations of Caregiver: Wife can assist. Caregiver Availability: 24/7 Discharge Plan Discussed with Primary Caregiver: Yes Is Caregiver In Agreement with Plan?: Yes Does  Caregiver/Family have Issues with Lodging/Transportation while Pt is in Rehab?: No  Goals/Additional Needs Patient/Family Goal for Rehab: PT/OT supervision and min assist, ST mod I and Independent goals Expected length of stay: 16-19 days Cultural Considerations: Attends [employer] Dietary Needs: Dys 1, thin liquids Equipment Needs: TBD Pt/Family Agrees to Admission and willing to participate: Yes Program Orientation Provided & Reviewed with Pt/Caregiver Including Roles  & Responsibilities: Yes  Decrease burden of Care through IP rehab admission: N/A  Possible need for SNF placement upon discharge: Not planned  Patient Condition: This patient's medical and functional status has changed since the consult dated: 02/07/16 in which the Rehabilitation Physician determined and documented that the patient's condition is appropriate for intensive rehabilitative care in an inpatient rehabilitation facility. See "History of Present Illness" (above) for medical update. Functional changes  are: Currently requiring min assist for transfers. Patient's medical and functional status update has been discussed with the Rehabilitation physician and patient remains appropriate for inpatient rehabilitation. Will admit to inpatient rehab today.  Preadmission Screen Completed By:  Retta Diones, 02/10/2016 2:35 PM ______________________________________________________________________   Discussed status with Dr.  Naaman Plummer on 02/10/16 at 1425 and received telephone approval for admission today.  Admission Coordinator:  Retta Diones, time1425/Date08/25/17       Cosigned by: Meredith Staggers, MD at 02/10/2016 2:48 PM  Revision History

## 2016-02-10 NOTE — Discharge Instructions (Signed)
No lifting no bending no twisting °

## 2016-02-10 NOTE — Discharge Summary (Signed)
  Physician Discharge Summary  Patient ID: Gary Craig MRN: [id number] DOB/AGE: [birth year] 82 y.o.  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 Patient was stabilized in the ICU was transferred to floor and subsequent excepting for rehabilitation.  Consults: Significant Diagnostic Studies: Treatments: Posterior cervical decompression fusion C4-5. Anterior cervical decompression fusion C4-5. Discharge Exam: Blood pressure 129/69, pulse 66, temperature 98 F (36.7 C), temperature source Oral, resp. rate 17, height 5\' 8"  (1.727 m), weight 70.9 kg (156 lb 4.9 oz), SpO2 100 %. Strength 4+ out of 5 intrinsics otherwise 5 out of 5 incisions clean dry and intact  Disposition: To nursing facility inpatient rehabilitation     Medication List    STOP taking these medications   clopidogrel 75 MG tablet Commonly known as:  PLAVIX     TAKE these medications   atorvastatin 40 MG tablet Commonly known as:  LIPITOR Take 1 tablet by mouth at bedtime.   Glucosamine-Chondroitin 500-400 MG Caps Take 1 tablet by mouth every morning.   levothyroxine 112 MCG tablet Commonly known as:  SYNTHROID, LEVOTHROID Take 112 mcg by mouth daily before breakfast.   lisinopril 2.5 MG tablet Commonly known as:  PRINIVIL,ZESTRIL Take 2.5 mg by mouth every morning.   metFORMIN  500 MG 24 hr tablet Commonly known as:  GLUCOPHAGE-XR Take 500 mg by mouth daily with breakfast.   multivitamin with minerals Tabs tablet Take 1 tablet by mouth every morning.   omega-3 acid ethyl esters 1 g capsule Commonly known as:  LOVAZA Take 1 g by mouth 2 (two) times daily.   oxyCODONE-acetaminophen 5-325 MG tablet Commonly known as:  PERCOCET/ROXICET Take 1 tablet by mouth every 6 (six) hours as needed for pain.   ranitidine 75 MG tablet Commonly known as:  ZANTAC Take 75 mg by mouth daily as needed for heartburn.      Follow-up Information    Bearl Talarico P, MD Follow up in 3 day(s).   Specialty:  Neurosurgery Contact information: [address] [place] [address] [phone]        Elaina Hoops, MD .   Specialty:  Neurosurgery Contact information: [address] [place] [address] [phone]           Signed: Elaina Hoops 02/10/2016, 11:21 AM

## 2016-02-10 NOTE — Interval H&P Note (Signed)
Gary Craig was admitted today to Inpatient Rehabilitation with the diagnosis of cervical myelopathy.  The patient's history has been reviewed, patient examined, and there is no change in status.  Patient continues to be appropriate for intensive inpatient rehabilitation.  I have reviewed the patient's chart and labs.  Questions were answered to the patient's satisfaction. The PAPE has been reviewed and assessment remains appropriate.  Gary Craig T 02/10/2016, 5:49 PM

## 2016-02-10 NOTE — Progress Notes (Signed)
Rehab admissions - I met with patient and I have clearance from Dr. [name] for acute inpatient rehab admission for today.  Bed available and will admit to inpatient rehab today.  Call me for questions.  #[phone]

## 2016-02-10 NOTE — Progress Notes (Signed)
Patient ID: Gary Craig, male   DOB: 1934/01/03, 82 y.o.   MRN: [id number] [place] doing great significant improvement upper extremities strength as well as improved lower extremities strength.  4+ out of 5 and intrinsics 5 out of 5 otherwise incisions clean dry and intact  Discontinued his anterior J-P drain left a Hemovac in until just prior to transfer to rehabilitation. Patient is doing well is stable enough for transfer rehabilitation whenever bed becomes available.

## 2016-02-10 NOTE — Progress Notes (Signed)
Orthopedic Tech Progress Note Patient Details:  Gary Craig [birth year] [id number]  Ortho Devices Type of Ortho Device: Soft collar Ortho Device/Splint Location: neck Ortho Device/Splint Interventions: Application   Yuritzi Kamp 02/10/2016, 11:52 AM

## 2016-02-11 ENCOUNTER — Inpatient Hospital Stay (HOSPITAL_COMMUNITY): Payer: Medicare Other

## 2016-02-11 ENCOUNTER — Inpatient Hospital Stay (HOSPITAL_COMMUNITY): Payer: Medicare Other | Admitting: Physical Therapy

## 2016-02-11 ENCOUNTER — Inpatient Hospital Stay (HOSPITAL_COMMUNITY): Payer: Medicare Other | Admitting: Speech Pathology

## 2016-02-11 DIAGNOSIS — E119 Type 2 diabetes mellitus without complications: Secondary | ICD-10-CM

## 2016-02-11 DIAGNOSIS — I10 Essential (primary) hypertension: Secondary | ICD-10-CM

## 2016-02-11 DIAGNOSIS — G8250 Quadriplegia, unspecified: Principal | ICD-10-CM

## 2016-02-11 DIAGNOSIS — G959 Disease of spinal cord, unspecified: Secondary | ICD-10-CM

## 2016-02-11 DIAGNOSIS — R1310 Dysphagia, unspecified: Secondary | ICD-10-CM

## 2016-02-11 LAB — GLUCOSE, CAPILLARY
GLUCOSE-CAPILLARY: 186 mg/dL — AB (ref 65–99)
GLUCOSE-CAPILLARY: 285 mg/dL — AB (ref 65–99)
Glucose-Capillary: 272 mg/dL — ABNORMAL HIGH (ref 65–99)

## 2016-02-11 MED ORDER — LISINOPRIL 5 MG PO TABS
5.0000 mg | ORAL_TABLET | Freq: Every day | ORAL | Status: DC
Start: 2016-02-12 — End: 2016-02-24
  Administered 2016-02-12 – 2016-02-24 (×13): 5 mg via ORAL
  Filled 2016-02-11 (×13): qty 1

## 2016-02-11 MED ORDER — METFORMIN HCL 500 MG PO TABS
500.0000 mg | ORAL_TABLET | Freq: Two times a day (BID) | ORAL | Status: DC
  Administered 2016-02-11 (×2): 500 mg via ORAL
  Filled 2016-02-11 (×3): qty 1

## 2016-02-11 NOTE — Evaluation (Signed)
Speech Language Pathology Assessment and Plan  Patient Details  Name: Gary Craig MRN: [id number] Date of Birth: 07-Sep-1933  SLP Diagnosis: Dysphagia  Rehab Potential: Good ELOS: 10-14 days     Today's Date: 02/11/2016 SLP Individual Time: 0902-1000 SLP Individual Time Calculation (min): 58 min    Problem List:  Patient Active Problem List   Diagnosis Date Noted  . Essential hypertension 02/10/2016  . Spinal stenosis, cervical region 02/08/2016  . Surgery, other elective   . Spondylosis, cervical, with myelopathy   . Diabetes mellitus type 2 in nonobese (HCC)   . Coronary artery disease involving native coronary artery of native heart without angina pectoris   . History of CVA (cerebrovascular accident)   . Post-operative pain   . Dysphagia   . Leukocytosis   . Tachypnea   . Quadriparesis ([place]) 02/05/2016  . Myelopathy, spondylogenic, cervical 01/16/2016  . CVA (cerebral infarction) 05/31/2015   Past Medical History:  Past Medical History:  Diagnosis Date  . CAD (coronary artery disease)   . Carotid stenosis   . Diabetes mellitus without complication ([place])   . GERD (gastroesophageal reflux disease)    zantac as needed  . High cholesterol   . History of kidney stones   . Hypothyroidism   . S/P CABG (coronary artery bypass graft)   . Skin cancer   . Stroke [facility]) 2012   tia   Past Surgical History:  Past Surgical History:  Procedure Laterality Date  . ANTERIOR CERVICAL DECOMP/DISCECTOMY FUSION N/A 01/16/2016   Procedure: Cervical Four-Five Anterior cervical decompression/diskectomy/fusion;  Surgeon: Kary Kos, MD;  Location: [facility];  Service: Neurosurgery;  Laterality: N/A;  right side approach  . CERVICAL WOUND DEBRIDEMENT N/A 02/05/2016   Procedure: Exploration of Cervical four - five wound. Removal of cervical four - five instrumentation. Insertion of new cervical four - five instrumentation.;  Surgeon: Leeroy Cha, MD;  Location: [facility];   Service: Neurosurgery;  Laterality: N/A;  Exploration of Cervical four - five wound. Removal of cervical four - five instrumentation. Insertion of new cervical four - five instrumentatio  . CORONARY ARTERY BYPASS GRAFT  11/29/2004  . EAR BIOPSY     skin ca  . LITHOTRIPSY Right   . POSTERIOR CERVICAL FUSION/FORAMINOTOMY N/A 02/08/2016   Procedure: CERVICAL FOUR-FIVE  Decompressive Laminectomy and Lateral Mass Fixation;  Surgeon: Kary Kos, MD;  Location: [facility];  Service: Neurosurgery;  Laterality: N/A;  . TONSILLECTOMY      Assessment / Plan / Recommendation Clinical Impression  Gary Craig a 82 y.o.right handed malewith history of diabetes mellitus, CAD status post CABG, CVA maintained on Plavix. Per chart review patient lives with wife 2 level home with bedroom on main floor. 3 steps to entry. Independent prior to admission and active prior to initial ACDF. 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 [name] and discharged to home 01/16/2016. Patient developed neck pain and numbness sensation in the hands with progressive quadriparesis. MRI of the cervical spine showed abnormal fluid collection projecting dorsally from the C4-5 interspace. Mild peripheral enhancement. Mild cord flattening and effacement of both C5 nerve roots. Mild edema in the C4 and C5 vertebral bodies. Underwent exploration of C4-5 fusion. Removal of the plate and the bone graft. Evacuation of epidural hematoma. Insertion of a new allograft 9 mm height plate 02/06/2016 per Dr. [name] followed by posterior cervical decompression 02/08/2016 per Dr. [name]. [facility] course pain management. Cervical  collar was placed. Decadron protocol as indicated. Currently on a dysphagia #1 thin liquid diet. Physical therapy evaluation completed 02/06/2016 with recommendations of physical medicine rehabilitation consult.  Pt admitted to CIR on 02/10/2016.  SLP  evaluation completed on 02/10/2016 with the following results:  Pt presents with s/s of a reversible post-surgical dysphagia which appears improved in comparison to reports of most recent MBS. Pt utilized extra swallows and hard cough every 2-3 bites/sips with mod I and demonstrated no overt s/s of aspiration with solids or liquids.  Pt also reports sensation of decreased edema since last ST visit which is likely contributing to his improved clinical presentation.  Pt has lost a considerable amount of weight after multiple surgical procedures and reports that he finds the pureed diet very difficult to consume due to taste preference and limited variety (PO intake of <50% of meals).  Given that pt is cognitively intact and is able to sense penetration to prevent aspiration; recommend that pt's diet be upgraded to dys 3 solids and thin liquids with intermittent supervision for use of swallowing precautions. Pt would benefit from skilled ST while inpatient in order to maximize functional independence for swallowing function and reduce burden of care prior to discharge.  ST follow up recommendations to be determined pending progress made while inpatient.    Skilled Therapeutic Interventions          Bedside swallowing evaluation completed with results and recommendations reviewed with patient.  SLP provided pt with skilled education regarding advanced textures.  Handouts were provided to maximize carryover in the home environment.  All questions were answered to pt's satisfaction at this time.   Pt in agreement with recommendations for diet advancement and reported that if he should have difficulty tolerating advanced solids that he should stop and tell nursing immediately.  If pt can not tolerate solids, recommend initiating a full liquids diet versus resuming purees given pt's preference.  Pt left in care of RN.      SLP Assessment  Patient will need skilled Speech Lanaguage Pathology Services during CIR  admission    Recommendations  SLP Diet Recommendations: Dysphagia 3 (Mech soft);Thin Liquid Administration via: Cup;Straw Medication Administration: Whole meds with puree Supervision: Intermittent supervision to cue for compensatory strategies Compensations: Multiple dry swallows after each bite/sip Postural Changes and/or Swallow Maneuvers: Seated upright 90 degrees Oral Care Recommendations: Oral care BID Patient destination: Home Follow up Recommendations: Other (comment) (TBD pending progress made while inpatient) Equipment Recommended: None recommended by SLP    SLP Frequency 3 to 5 out of 7 days   SLP Duration  SLP Intensity  SLP Treatment/Interventions 10-14 days   Minumum of 1-2 x/day, 30 to 90 minutes  Dysphagia/aspiration precaution training;Patient/family education    Pain Pain Assessment Pain Assessment: No/denies pain  Prior Functioning Cognitive/Linguistic Baseline: Within functional limits Type of Home: House  Lives With: Spouse Available Help at Discharge: Family;Available 24 hours/day Education: AA Business Vocation: Retired  Function:  Eating Eating   Modified Consistency Diet: Yes Eating Assist Level: Swallowing techniques: self managed;Set up assist for   Eating Set Up Assist For: Opening containers       Cognition Comprehension Comprehension assist level: Follows complex conversation/direction with extra time/assistive device  Expression   Expression assist level: Expresses complex ideas: With no assist  Social Interaction Social Interaction assist level: Interacts appropriately with others with medication or extra time (anti-anxiety, antidepressant).  Problem Solving Problem solving assist level: Solves complex problems: With extra time  Memory  Memory assist level: More than reasonable amount of time   Short Term Goals: Week 1: SLP Short Term Goal 1 (Week 1): Pt will consume dys 3 textures and thin liquids with mod I use of swallowing  precautions and minimal overt s/s of aspiration.  SLP Short Term Goal 2 (Week 1): Pt will consume therapeutic trials of regular textures with mod I use of swallowing precautions and minimal overt s/s of aspiration over 3 consecutive session prior to advancement.    Refer to Care Plan for Long Term Goals  Recommendations for other services: None  Discharge Criteria: Patient will be discharged from SLP if patient refuses treatment 3 consecutive times without medical reason, if treatment goals not met, if there is a change in medical status, if patient makes no progress towards goals or if patient is discharged from hospital.  The above assessment, treatment plan, treatment alternatives and goals were discussed and mutually agreed upon: by patient  Emilio Math 02/11/2016, 12:37 PM

## 2016-02-11 NOTE — Progress Notes (Signed)
[place] PHYSICAL MEDICINE & REHABILITATION     PROGRESS NOTE  Subjective/Complaints:  Pt seen laying in bed this AM.  He slept well overnight. He is ready to begin his day of therapies.  ROS: Denies CP, SOB, N/V/D.  Objective: Vital Signs: Blood pressure (!) 144/70, pulse 77, temperature 97.8 F (36.6 C), temperature source Oral, resp. rate 18, height 5\' 8"  (1.727 m), weight 67.7 kg (149 lb 4.8 oz), SpO2 98 %. No results found. No results for input(s): WBC, HGB, HCT, PLT in the last 72 hours. No results for input(s): NA, K, CL, GLUCOSE, BUN, CREATININE, CALCIUM in the last 72 hours.  Invalid input(s): CO CBG (last 3)   Recent Labs  02/10/16 1624 02/10/16 2139 02/11/16 0651  GLUCAP 158* 248* 186*    Wt Readings from Last 3 Encounters:  02/10/16 67.7 kg (149 lb 4.8 oz)  02/09/16 70.9 kg (156 lb 4.9 oz)  02/05/16 72.6 kg (160 lb)    Physical Exam:  BP (!) 144/70 (BP Location: Right Arm)   Pulse 77   Temp 97.8 F (36.6 C) (Oral)   Resp 18   Ht 5\' 8"  (1.727 m)   Wt 67.7 kg (149 lb 4.8 oz)   SpO2 98%   BMI 22.70 kg/m  Constitutional: He appears well-developedand well-nourished. NAD.  HENT: Normocephalic.  Right Ear: External earnormal.  Left Ear: External earnormal.  Eyes: Conjunctivaeand EOMare normal.  Neck: Cervical collar in place Cardiovascular: Normal rateand regular rhythm. no murmurs or rubs Respiratory: Effort normaland breath sounds normal. No respiratory distress. No wheezes or rhonchi GI: Soft. Bowel sounds are normal. He exhibits no distension. Non tender. Musculoskeletal: He exhibits no edemaor tenderness.  Neurological: He is alertand oriented.  Motor: RUE: 4+/5 deltoid, bicep, tricep, wrist and HI 4/5.  LUE: 4+/5 proximal to distal RLE 3-/5 HF, KE and 4/5 ADF/PF.  LUE: 3+/5 HF, KE, 4+/5 prox to distal Decreased FMC RUE and RLE.  Skin: Skin is warmand dry.  Surgical site is dressed with drain in place Psychiatric: He has a normal  mood and affect. His behavior is normal   Assessment/Plan: 1. 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 Physiatrist is providing close team supervision and 24 hour management of active medical problems listed below. Physiatrist and rehab team continue to assess barriers to discharge/monitor patient progress toward functional and medical goals.  Function:  Bathing Bathing position      Bathing parts      Bathing assist        Upper Body Dressing/Undressing Upper body dressing                    Upper body assist        Lower Body Dressing/Undressing Lower body dressing                                  Lower body assist        Toileting Toileting          Toileting assist     Transfers Chair/bed transfer             Locomotion Ambulation           Wheelchair          Cognition Comprehension Comprehension assist level: Understands complex 90% of the time/cues 10% of the time  Expression Expression assist level: Expresses complex 90% of the time/cues < 10% of the time  Social Interaction Social Interaction assist level: Interacts appropriately with others with medication or extra time (anti-anxiety, antidepressant).  Problem Solving Problem solving assist level: Solves basic problems with no assist  Memory Memory assist level: More than reasonable amount of time     Medical Problem List and Plan: 1. 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 Status post ACDF 01/16/2016 with postoperative epidural hematoma status post exploration and fusion removal of plate and bone graft 02/08/2016 followed by posterior cervical decompression 02/08/2016. Cervical collar at all times  Begin CIR 2.  DVT Prophylaxis/Anticoagulation: SCDs.   Vascular studies pending 3. Pain Management: Tylenol as needed.                         -denies any pain at present even at op site 4. Diabetes mellitus. Hemoglobin A1c 6.4. Glucophage 500 mg daily. Check blood sugars before meals and at bedtime 5. Neuropsych: This patient is capable of making decisions on his own behalf. 6. Skin/Wound Care: Routine skin checks 7. Fluids/Electrolytes/Nutrition: Routine I&O's with follow-up chemistry 8. Dysphagia. Dysphagia #1 thin liquid diet                       -follow up per SLP 9. Hypertension.   Lisinopril 2.5 mg daily increased to 5mg  on 8/26  Monitor with increased mobility 10. Hypothyroidism. Continue Synthroid. 11. Hyperlipidemia. Lipitor   LOS (Days) 1 A FACE TO FACE EVALUATION WAS PERFORMED  Ankit Lorie Phenix 02/11/2016 9:08 AM

## 2016-02-11 NOTE — Evaluation (Signed)
Occupational Therapy Assessment and Plan  Patient Details  Name: Gary Craig MRN: [id number] Date of Birth: 17-Jun-1934  OT Diagnosis: quadriparesis at level C6 Rehab Potential: Rehab Potential (ACUTE ONLY): Good ELOS: 10-14 days   Today's Date: 02/11/2016 OT Individual Time: 1100-1210 OT Individual Time Calculation (min): 70 min      Problem List:  Patient Active Problem List   Diagnosis Date Noted  . Essential hypertension 02/10/2016  . Spinal stenosis, cervical region 02/08/2016  . Surgery, other elective   . Spondylosis, cervical, with myelopathy   . Diabetes mellitus type 2 in nonobese (HCC)   . Coronary artery disease involving native coronary artery of native heart without angina pectoris   . History of CVA (cerebrovascular accident)   . Post-operative pain   . Dysphagia   . Leukocytosis   . Tachypnea   . Quadriparesis ([place]) 02/05/2016  . Myelopathy, spondylogenic, cervical 01/16/2016  . CVA (cerebral infarction) 05/31/2015    Past Medical History:  Past Medical History:  Diagnosis Date  . CAD (coronary artery disease)   . Carotid stenosis   . Diabetes mellitus without complication ([place])   . GERD (gastroesophageal reflux disease)    zantac as needed  . High cholesterol   . History of kidney stones   . Hypothyroidism   . S/P CABG (coronary artery bypass graft)   . Skin cancer   . Stroke [facility] 2012   tia   Past Surgical History:  Past Surgical History:  Procedure Laterality Date  . ANTERIOR CERVICAL DECOMP/DISCECTOMY FUSION N/A 01/16/2016   Procedure: Cervical Four-Five Anterior cervical decompression/diskectomy/fusion;  Surgeon: Kary Kos, MD;  Location: [place] NEURO ORS;  Service: Neurosurgery;  Laterality: N/A;  right side approach  . CERVICAL WOUND DEBRIDEMENT N/A 02/05/2016   Procedure: Exploration of Cervical four - five wound. Removal of cervical four - five instrumentation. Insertion of new cervical four - five instrumentation.;  Surgeon: Leeroy Cha, MD;  Location: [facility] NEURO ORS;  Service: Neurosurgery;  Laterality: N/A;  Exploration of Cervical four - five wound. Removal of cervical four - five instrumentation. Insertion of new cervical four - five instrumentatio  . CORONARY ARTERY BYPASS GRAFT  11/29/2004  . EAR BIOPSY     skin ca  . LITHOTRIPSY Right   . POSTERIOR CERVICAL FUSION/FORAMINOTOMY N/A 02/08/2016   Procedure: CERVICAL FOUR-FIVE  Decompressive Laminectomy and Lateral Mass Fixation;  Surgeon: Kary Kos, MD;  Location: [place] NEURO ORS;  Service: Neurosurgery;  Laterality: N/A;  . TONSILLECTOMY      Assessment & Plan Clinical Impression: Patient is a 82 y.o. year old malewith history of diabetes mellitus, CAD status post CABG, CVA maintained on Plavix. Per chart review patient lives with wife 2 level home with bedroom on main floor. 3 steps to entry. Independent prior to admission and active prior to initial ACDF. 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 [name] and discharged to home 01/16/2016. Patient developed neck pain and numbness sensation in the hands with progressive quadriparesis. MRI of the cervical spine showed abnormal fluid collection projecting dorsally from the C4-5 interspace. Mild peripheral enhancement. Mild cord flattening and effacement of both C5 nerve roots. Mild edema in the C4 and C5 vertebral bodies. Underwent exploration of C4-5 fusion. Removal of the plate and the bone graft. Evacuation of epidural hematoma. Insertion of a new allograft 9 mm height plate 02/06/2016 per Dr. [name] followed by posterior cervical decompression 02/08/2016 per Dr. [name]. [facility] course  pain management. Cervical collar was placed. Decadron protocol as indicated. Currently on a dysphagia #1 thin liquid diet. Physical therapy evaluation completed 02/06/2016 with recommendations of physical medicine rehabilitation consult.    Patient transferred to CIR  on 02/10/2016 .    Patient currently requires moderate assistance with basic self-care skills secondary to muscle weakness.  Prior to hospitalization, patient could complete BADL and iADL independently.   Patient will benefit from skilled intervention to increase independence with basic self-care skills prior to discharge home with care partner.  Anticipate patient will require intermittent supervision and follow up home health.  OT - End of Session Activity Tolerance: Tolerates 30+ min activity with multiple rests Endurance Deficit: Yes OT Assessment Rehab Potential (ACUTE ONLY): Good Barriers to Discharge: Decreased caregiver support Barriers to Discharge Comments: Wife doesn't drive OT Patient demonstrates impairments in the following area(s): Balance;Safety;Sensory;Endurance;Motor OT Basic ADL's Functional Problem(s): Eating;Grooming;Bathing;Dressing;Toileting OT Advanced ADL's Functional Problem(s): Simple Meal Preparation OT Transfers Functional Problem(s): Toilet;Tub/Shower OT Plan OT Intensity: Minimum of 1-2 x/day, 45 to 90 minutes OT Frequency: 5 out of 7 days OT Duration/Estimated Length of Stay: 10-14 days OT Treatment/Interventions: Discharge planning;Balance/vestibular training;Self Care/advanced ADL retraining;Therapeutic Activities;UE/LE Coordination activities;Therapeutic Exercise;UE/LE Strength taining/ROM;Patient/family education;DME/adaptive equipment instruction;Neuromuscular re-education;Functional mobility training OT Self Feeding Anticipated Outcome(s): Mod I OT Basic Self-Care Anticipated Outcome(s): Supervision OT Toileting Anticipated Outcome(s): Mod I OT Bathroom Transfers Anticipated Outcome(s): Supervision OT Recommendation Patient destination: Home Follow Up Recommendations: Home health OT Equipment Recommended: To be determined   Skilled Therapeutic Intervention OT 1:1 evaluation completed with treatment provided to address adherence to precautions,  functional transfers, pt ed on methods and goals of treatment, and adapted bathing/dressings skills training.  Pt received supine in bed with soft collar on.   OT advised pt of need for use of Aspen collar as per MD recommendations which was found in room closet.  No extra pads found, RN alerted to need for extra pads to use when bathing.    Pt able to rise to edge of bed with min assist and attempted ambulation to bathroom using RW provided.   OT advised use of w/c with RN tech staff and provided 3:1 to address toileting needs d/t unsteady gait with RLE weakness.   Pt performed ADL seated at sink with overall mod assist for lower body dressing.   Primary deficits relate to impaired sensation at both hands and weakness in LE.   Pt left in w/c at end of session with all needs within reach.  OT Evaluation Precautions/Restrictions  Precautions Precautions: Fall;Cervical Required Braces or Orthoses: Cervical Brace Cervical Brace: Hard collar Restrictions Weight Bearing Restrictions: No  General Chart Reviewed: Yes Family/Caregiver Present: No  Vital Signs  Pain Pain Assessment Pain Assessment: No/denies pain   Home Living/Prior Functioning Home Living Available Help at Discharge: Family, Available 24 hours/day Type of Home: House Home Access: Stairs to enter [employer] of Steps: 4 (4 from garage, 3 from deck) Entrance Stairs-Rails: Right Home Layout: Two level, Able to live on main level with bedroom/bathroom Alternate Level Stairs-Number of Steps: 13 steps, straight Alternate Level Stairs-Rails: Left Bathroom Shower/Tub: Tub/shower unit, Architectural technologist: Standard Bathroom Accessibility: Yes  Lives With: Spouse IADL History Homemaking Responsibilities: Yes Meal Prep Responsibility: Secondary Laundry Responsibility: Secondary Cleaning Responsibility: Primary Bill Paying/Finance Responsibility: Primary Shopping Responsibility: Primary Child Care Responsibility:  No Current License: Yes Mode of Transportation: Car Education: AA Business Occupation: Retired 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   ADL ADL ADL Comments: see Functional Assessment Tool  Vision/Perception  Vision- History Baseline Vision/History: Wears glasses Wears Glasses: At all times Patient Visual Report: No change from baseline Vision- Assessment Vision Assessment?: No apparent visual deficits   Cognition Overall Cognitive Status: Within Functional Limits for tasks assessed Arousal/Alertness: Awake/alert Orientation Level: Person;Situation;Place Person: Oriented Place: Oriented Situation: Oriented Year: 2017 Month: August Day of Week: Correct Memory: Appears intact Immediate Memory Recall: Sock;Blue;Bed Memory Recall: Sock;Blue;Bed Memory Recall Sock: With Cue Memory Recall Blue: With Cue Memory Recall Bed: With Cue Attention: Selective Selective Attention: Appears intact Awareness: Appears intact Problem Solving: Appears intact Safety/Judgment: Appears intact  Sensation Sensation Light Touch: Impaired by gross assessment Proprioception: Impaired by gross assessment Coordination Gross Motor Movements are Fluid and Coordinated: Yes Fine Motor Movements are Fluid and Coordinated: No   Motor  Motor Motor: Ataxia   Mobility  Bed Mobility Bed Mobility: Rolling Right;Sit to Supine;Supine to Sit Rolling Right: 4: Min assist Supine to Sit: 3: Mod assist Sit to Supine: 4: Min assist Transfers Sit to Stand: 3: Mod assist Stand to Sit: 3: Mod assist   Trunk/Postural Assessment  Cervical Assessment Cervical Assessment: Exceptions to [facility] Postural  Control Postural Control: Deficits on evaluation   Balance Balance Balance Assessed: Yes Static Sitting Balance Static Sitting - Balance Support: Feet supported Static Sitting - Level of Assistance: 5: Stand by assistance Static Standing Balance Static Standing - Balance Support: During functional activity Static Standing - Level of Assistance: 3: Mod assist Dynamic Standing Balance Dynamic Standing - Balance Support: During functional activity   Extremity/Trunk Assessment RUE Assessment RUE Assessment: Exceptions to [facility] RUE Strength RUE Overall Strength: Deficits (4/5) LUE Assessment LUE Assessment: Exceptions to [facility] LUE Strength LUE Overall Strength: Deficits (4/5)   See Function Navigator for Current Functional Status.   Refer to Care Plan for Long Term Goals  Recommendations for other services: None  Discharge Criteria: Patient will be discharged from OT if patient refuses treatment 3 consecutive times without medical reason, if treatment goals not met, if there is a change in medical status, if patient makes no progress towards goals or if patient is discharged from hospital.  The above assessment, treatment plan, treatment alternatives and goals were discussed and mutually agreed upon: by patient  [facility] 02/11/2016, 5:05 PM

## 2016-02-11 NOTE — Evaluation (Signed)
Physical Therapy Assessment and Plan  Patient Details  Name: Gary Craig MRN: [id number] Date of Birth: Nov 21, 1933  PT Diagnosis: Ataxic gait, Impaired sensation and Muscle weakness Rehab Potential: Fair ELOS: 12 to 14 days   Today's Date: 02/11/2016 PT Individual Time: 1300-1415 PT Individual Time Calculation (min): 75 min     Problem List: Patient Active Problem List   Diagnosis Date Noted  . Essential hypertension 02/10/2016  . Spinal stenosis, cervical region 02/08/2016  . Surgery, other elective   . Spondylosis, cervical, with myelopathy   . Diabetes mellitus type 2 in nonobese (HCC)   . Coronary artery disease involving native coronary artery of native heart without angina pectoris   . History of CVA (cerebrovascular accident)   . Post-operative pain   . Dysphagia   . Leukocytosis   . Tachypnea   . Quadriparesis ([place]) 02/05/2016  . Myelopathy, spondylogenic, cervical 01/16/2016  . CVA (cerebral infarction) 05/31/2015    Past Medical History:  Past Medical History:  Diagnosis Date  . CAD (coronary artery disease)   . Carotid stenosis   . Diabetes mellitus without complication ([place])   . GERD (gastroesophageal reflux disease)    zantac as needed  . High cholesterol   . History of kidney stones   . Hypothyroidism   . S/P CABG (coronary artery bypass graft)   . Skin cancer   . Stroke [facility]) 2012   tia   Past Surgical History:  Past Surgical History:  Procedure Laterality Date  . ANTERIOR CERVICAL DECOMP/DISCECTOMY FUSION N/A 01/16/2016   Procedure: Cervical Four-Five Anterior cervical decompression/diskectomy/fusion;  Surgeon: Kary Kos, MD;  Location: [place] NEURO ORS;  Service: Neurosurgery;  Laterality: N/A;  right side approach  . CERVICAL WOUND DEBRIDEMENT N/A 02/05/2016   Procedure: Exploration of Cervical four - five wound. Removal of cervical four - five instrumentation. Insertion of new cervical four - five instrumentation.;  Surgeon: Leeroy Cha, MD;   Location: [facility] NEURO ORS;  Service: Neurosurgery;  Laterality: N/A;  Exploration of Cervical four - five wound. Removal of cervical four - five instrumentation. Insertion of new cervical four - five instrumentatio  . CORONARY ARTERY BYPASS GRAFT  11/29/2004  . EAR BIOPSY     skin ca  . LITHOTRIPSY Right   . POSTERIOR CERVICAL FUSION/FORAMINOTOMY N/A 02/08/2016   Procedure: CERVICAL FOUR-FIVE  Decompressive Laminectomy and Lateral Mass Fixation;  Surgeon: Kary Kos, MD;  Location: [place] NEURO ORS;  Service: Neurosurgery;  Laterality: N/A;  . TONSILLECTOMY      Assessment & Plan Clinical Impression: Patientis a 82 y.o.right handed malewith history of diabetes mellitus, CAD status post CABG, CVA maintained on Plavix. Per chart review patient lives with wife 2 level home with bedroom on main floor. 3 steps to entry. Independent prior to admission and active prior to initial ACDF. 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 [name] and discharged to home 01/16/2016. Patient developed neck pain and numbness sensation in the hands with progressive quadriparesis. MRI of the cervical spine showed abnormal fluid collection projecting dorsally from the C4-5 interspace. Mild peripheral enhancement. Mild cord flattening and effacement of both C5 nerve roots. Mild edema in the C4 and C5 vertebral bodies. Underwent exploration of C4-5 fusion. Removal of the plate and the bone graft. Evacuation of epidural hematoma. Insertion of a new allograft 9 mm height plate 02/06/2016 per Dr. [name] followed by posterior cervical decompression 02/08/2016 per Dr. [name]. [facility] course pain management. Cervical  collar was placed. Decadron protocol as indicated. Currently on a dysphagia #1 thin liquid diet.   Patient transferred to CIR on 02/10/2016 .   Patient currently requires max with mobility secondary to muscle weakness, decreased cardiorespiratoy  endurance and ataxia and decreased coordination.  Prior to hospitalization, patient was independent  with mobility and lived with Spouse in a House home.  Home access is 4Stairs to enter.  Patient will benefit from skilled PT intervention to maximize safe functional mobility, minimize fall risk and decrease caregiver burden for planned discharge home with 24 hour supervision.  Anticipate patient will benefit from follow up [place] at discharge.  PT - End of Session Activity Tolerance: Tolerates 30+ min activity with multiple rests Endurance Deficit: Yes PT Assessment Rehab Potential (ACUTE/IP ONLY): Fair Barriers to Discharge: Inaccessible home environment PT Patient demonstrates impairments in the following area(s): Balance;Endurance;Motor PT Transfers Functional Problem(s): Bed Mobility;Bed to Chair;Car PT Locomotion Functional Problem(s): Ambulation;Wheelchair Mobility;Stairs PT Plan PT Intensity: Minimum of 1-2 x/day ,45 to 90 minutes PT Frequency: 5 out of 7 days PT Duration Estimated Length of Stay: 12 to 14 days PT Treatment/Interventions: Training and development officer;Ambulation/gait training;Discharge planning;Community reintegration;DME/adaptive equipment instruction;Functional electrical stimulation;Functional mobility training;Neuromuscular re-education;Splinting/orthotics;Stair training;Therapeutic Activities;Therapeutic Exercise;UE/LE Strength taining/ROM;UE/LE Coordination activities;Wheelchair propulsion/positioning PT Transfers Anticipated Outcome(s): mod I transfers PT Locomotion Anticipated Outcome(s): mod I w/c mobility, mod I ambulation, S stairs.  PT Recommendation Follow Up Recommendations: Home health PT Patient destination: Home Equipment Recommended: To be determined  Skilled Therapeutic Intervention PT evaluation completed and treatment plan initiated. Pt performed B hip flex and LAQs, 3 sets x 10 reps each with 2 lbs each. Pt performed multiple sit to stand transfers  with min to mod A with rolling walker. Pt ambulated 50 feet with rolling walker and mod A with verbal cues. Pt ambulated 50 feet with rolling walker and mod A with 2 lbs weights on B ankles for improved proprioceptive feedback with improved gait pattern noted. Pt propelled w/c back to room with B UEs and S. Pt left sitting up in w/c with call bell within reach.   PT Evaluation Precautions/Restrictions Precautions Precautions: Fall;Cervical Required Braces or Orthoses: Cervical Brace Cervical Brace: Hard collar Restrictions Weight Bearing Restrictions: No General Chart Reviewed: Yes Family/Caregiver Present: No  Pain Pt c/o 2/10 posterior cervical neck pain.   Home Living/Prior Functioning Home Living Available Help at Discharge: Family;Available 24 hours/day Type of Home: House Home Access: Stairs to enter [employer] of Steps: 4 Entrance Stairs-Rails: Right Home Layout: Two level;Able to live on main level with bedroom/bathroom Alternate Level Stairs-Number of Steps: 13 steps, straight Alternate Level Stairs-Rails: Left  Lives With: Spouse Prior Function Level of Independence: Independent with transfers;Independent with gait  Able to Take Stairs?: Yes Driving: Yes Vocation: Retired Vision/Perception  As per OT evaluation.  Cognition Overall Cognitive Status: Within Functional Limits for tasks assessed Arousal/Alertness: Awake/alert Orientation Level: Oriented X4 Memory: Appears intact Awareness: Appears intact Problem Solving: Appears intact Safety/Judgment: Appears intact Sensation Sensation Light Touch: Impaired by gross assessment (impaired B LEs ) Proprioception: Impaired by gross assessment (impaired B LEs ) Motor  Motor Motor: Ataxia  Mobility Bed Mobility Bed Mobility: Rolling Right;Sit to Supine;Supine to Sit Rolling Right: 4: Min assist Supine to Sit: 3: Mod assist Sit to Supine: 4: Min assist Transfers Transfers: Yes Sit to Stand: 3: Mod  assist Stand to Sit: 3: Mod assist Stand Pivot Transfers: 3: Mod assist Locomotion  Ambulation Ambulation: Yes Ambulation/Gait Assistance: 2: Max assist Ambulation Distance (Feet): 50  Feet Assistive device: None Stairs / Additional Locomotion Stairs: Yes Stairs Assistance: 2: Max assist Stair Management Technique: Two rails Number of Stairs: 4 Height of Stairs: 6 Wheelchair Mobility Wheelchair Mobility: Yes Wheelchair Assistance: 5: Careers information officer: Both upper extremities Distance: 150  Trunk/Postural Assessment  Cervical Assessment Cervical Assessment: Exceptions to [facility] Postural Control Postural Control: Deficits on evaluation  Balance Balance Balance Assessed: Yes Static Sitting Balance Static Sitting - Balance Support: Feet supported Static Sitting - Level of Assistance: 5: Stand by assistance Static Standing Balance Static Standing - Balance Support: During functional activity Static Standing - Level of Assistance: 3: Mod assist Dynamic Standing Balance Dynamic Standing - Balance Support: During functional activity Extremity Assessment B UEs as per OT evaluation.   RLE Assessment RLE Assessment: Exceptions to [facility] RLE PROM (degrees) Overall PROM Right Lower Extremity: Within functional limits for tasks assessed RLE Strength RLE Overall Strength: Deficits RLE Overall Strength Comments: grossly 3-/5 LLE Assessment LLE Assessment: Exceptions to WFL LLE PROM (degrees) Overall PROM Left Lower Extremity: Within functional limits for tasks assessed LLE Strength LLE Overall Strength: Deficits LLE Overall Strength Comments: grossly 3/5   See Function Navigator for Current Functional Status.   Refer to Care Plan for Long Term Goals  Recommendations for other services: None  Discharge Criteria: Patient will be discharged from PT if patient refuses treatment 3 consecutive times without medical reason, if treatment goals not met, if there is a  change in medical status, if patient makes no progress towards goals or if patient is discharged from hospital.  The above assessment, treatment plan, treatment alternatives and goals were discussed and mutually agreed upon: by patient  Dub Amis 02/11/2016, 4:37 PM

## 2016-02-12 ENCOUNTER — Inpatient Hospital Stay (HOSPITAL_COMMUNITY): Payer: Medicare Other

## 2016-02-12 ENCOUNTER — Inpatient Hospital Stay (HOSPITAL_COMMUNITY): Payer: Medicare Other | Admitting: Physical Therapy

## 2016-02-12 DIAGNOSIS — M79662 Pain in left lower leg: Secondary | ICD-10-CM

## 2016-02-12 DIAGNOSIS — M79661 Pain in right lower leg: Secondary | ICD-10-CM

## 2016-02-12 LAB — GLUCOSE, CAPILLARY
Glucose-Capillary: 245 mg/dL — ABNORMAL HIGH (ref 65–99)
Glucose-Capillary: 227 mg/dL — ABNORMAL HIGH (ref 65–99)
Glucose-Capillary: 234 mg/dL — ABNORMAL HIGH (ref 65–99)
Glucose-Capillary: 229 mg/dL — ABNORMAL HIGH (ref 65–99)

## 2016-02-12 MED ORDER — METFORMIN HCL 500 MG PO TABS
1000.0000 mg | ORAL_TABLET | Freq: Two times a day (BID) | ORAL | Status: DC
  Administered 2016-02-12 (×2): 1000 mg via ORAL
  Filled 2016-02-12 (×3): qty 2

## 2016-02-12 MED ORDER — DOCUSATE SODIUM 100 MG PO CAPS
100.0000 mg | ORAL_CAPSULE | Freq: Two times a day (BID) | ORAL | Status: DC
  Administered 2016-02-12 – 2016-02-24 (×25): 100 mg via ORAL
  Filled 2016-02-12 (×25): qty 1

## 2016-02-12 NOTE — Progress Notes (Signed)
*  PRELIMINARY RESULTS* Vascular Ultrasound Lower extremity venous duplex has been completed.  Preliminary findings: No evidence of DVT or baker's cyst.  Landry Mellow, RDMS, RVT  02/12/2016, 9:37 AM

## 2016-02-12 NOTE — Progress Notes (Signed)
[facility] PHYSICAL MEDICINE & REHABILITATION     PROGRESS NOTE  Subjective/Complaints:  Pt laying in bed.  He slept extremely well overnight.  He had a good day of therapies yesterday and feels he is getting stronger.   ROS: Denies CP, SOB, N/V/D.  Objective: Vital Signs: Blood pressure 131/75, pulse 66, temperature 97.9 F (36.6 C), temperature source Oral, resp. rate 18, height 5\' 8"  (1.727 m), weight 67.7 kg (149 lb 4.8 oz), SpO2 96 %. No results found. No results for input(s): WBC, HGB, HCT, PLT in the last 72 hours. No results for input(s): NA, K, CL, GLUCOSE, BUN, CREATININE, CALCIUM in the last 72 hours.  Invalid input(s): CO CBG (last 3)   Recent Labs  02/11/16 1615 02/11/16 2106 02/12/16 0643  GLUCAP 272* 285* 245*    Wt Readings from Last 3 Encounters:  02/10/16 67.7 kg (149 lb 4.8 oz)  02/09/16 70.9 kg (156 lb 4.9 oz)  02/05/16 72.6 kg (160 lb)    Physical Exam:  BP 131/75 (BP Location: Right Arm)   Pulse 66   Temp 97.9 F (36.6 C) (Oral)   Resp 18   Ht 5\' 8"  (1.727 m)   Wt 67.7 kg (149 lb 4.8 oz)   SpO2 96%   BMI 22.70 kg/m  Constitutional: He appears well-developedand well-nourished. NAD.  HENT: Normocephalic.  Right Ear: External earnormal.  Left Ear: External earnormal.  Eyes: Conjunctivaeand EOMare normal.  Neck: Cervical collar in place Cardiovascular: Normal rateand regular rhythm. no murmurs or rubs Respiratory: Effort normaland breath sounds normal. No respiratory distress. No wheezes or rhonchi GI: Soft. Bowel sounds are normal. He exhibits no distension. Non tender. Musculoskeletal: He exhibits no edemaor tenderness.  Neurological: He is alertand oriented.  Motor: RUE: 4+/5 deltoid, bicep, tricep, wrist and HI 4-4+/5.  LUE: 4+/5 proximal to distal RLE 3/5 HF, KE and 4/5 ADF/PF.  LUE: 3+/5 HF, KE, 4+/5 prox to distal Decreased FMC RUE and RLE.  Skin: Skin is warmand dry.  Surgical site is dressed with drain in  place Psychiatric: He has a normal mood and affect. His behavior is normal   Assessment/Plan: 1. 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 Physiatrist is providing close team supervision and 24 hour management of active medical problems listed below. Physiatrist and rehab team continue to assess barriers to discharge/monitor patient progress toward functional and medical goals.  Function:  Bathing Bathing position   Position: Wheelchair/chair at sink  Bathing parts Body parts bathed by patient: Right arm, Left arm, Chest, Abdomen, Front perineal area Body parts bathed by helper: Buttocks, Back  Bathing assist Assist Level: Touching or steadying assistance(Pt > 75%)      Upper Body Dressing/Undressing Upper body dressing   What is the patient wearing?: Pull over shirt/dress     Pull over shirt/dress - Perfomed by patient: Thread/unthread right sleeve, Thread/unthread left sleeve Pull over shirt/dress - Perfomed by helper: Put head through opening, Pull shirt over trunk        Upper body assist Assist Level:  (Mod Assist)      Lower Body Dressing/Undressing Lower body dressing   What is the patient wearing?: Underwear, Pants, Shoes   Underwear - Performed by helper: Thread/unthread right underwear leg, Thread/unthread left underwear leg, Pull underwear up/down Pants- Performed by patient: Thread/unthread right pants leg, Thread/unthread left pants leg Pants- Performed by helper: Pull pants up/down, Fasten/unfasten pants  Shoes - Performed by helper: Don/doff right shoe, Don/doff left shoe, Fasten right, Fasten left          Lower body assist Assist for lower body dressing:  (Max Assist)      Toileting Toileting     Toileting steps completed by helper: Adjust clothing prior to toileting, Performs perineal hygiene, Adjust clothing  after toileting    Toileting assist Assist level:  (Max Assist)   Transfers Chair/bed transfer   Chair/bed transfer method: Stand pivot Chair/bed transfer assist level: Moderate assist (Pt 50 - 74%/lift or lower) Chair/bed transfer assistive device: Armrests     Locomotion Ambulation     Max distance: 50 Assist level: Maximal assist (Pt 25 - 49%)   Wheelchair   Type: Manual Max wheelchair distance: 150 Assist Level: Supervision or verbal cues  Cognition Comprehension Comprehension assist level: Follows complex conversation/direction with no assist  Expression Expression assist level: Expresses complex ideas: With no assist  Social Interaction Social Interaction assist level: Interacts appropriately with others - No medications needed.  Problem Solving Problem solving assist level: Solves complex problems: Recognizes & self-corrects  Memory Memory assist level: Complete Independence: No helper     Medical Problem List and Plan: 1. 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 Status post ACDF 01/16/2016 with postoperative epidural hematoma status post exploration and fusion removal of plate and bone graft 02/08/2016 followed by posterior cervical decompression 02/08/2016. Cervical collar at all times  Cont CIR 2.  DVT Prophylaxis/Anticoagulation: SCDs.   Vascular studies pending 3. Pain Management: Tylenol as needed.                        -denies any pain at present even at op site 4. Diabetes mellitus. Hemoglobin A1c 6.4.   Glucophage 500 mg BID, increased to 1000 BID on 8/27  Check blood sugars before meals and at bedtime 5. Neuropsych: This patient is capable of making decisions on his own behalf. 6. Skin/Wound Care: Routine skin checks 7. Fluids/Electrolytes/Nutrition: Routine I&O's with follow-up chemistry 8. Dysphagia. Dysphagia #1 thin liquid diet                       -follow up per SLP 9. Hypertension: Improved  Lisinopril 2.5 mg daily increased to  5mg  on 8/26  Monitor with increased mobility 10. Hypothyroidism. Continue Synthroid. 11. Hyperlipidemia. Lipitor   LOS (Days) 2 A FACE TO FACE EVALUATION WAS PERFORMED  Ankit Lorie Phenix 02/12/2016 8:02 AM

## 2016-02-12 NOTE — Progress Notes (Signed)
Physical Therapy Session Note  Patient Details  Name: Gary Craig MRN: [id number] Date of Birth: June 01, 1934  Today's Date: 02/12/2016 PT Individual Time: 1030-1100 PT Individual Time Calculation (min): 30 min    Short Term Goals: Week 1:  PT Short Term Goal 1 (Week 1): Pt will increase bed mobility to S.  PT Short Term Goal 2 (Week 1): Pt will increase transfers bed to chair with appropriate assistive device to min A.  PT Short Term Goal 3 (Week 1): Pt will ambulate about 150 feet with rolling walker and min A.  PT Short Term Goal 4 (Week 1): Pt will ascend/descend 4 stairs with 2 rails and min A.  PT Short Term Goal 5 (Week 1): Pt will propel w/c greater than 150 feet with mod I.   Skilled Therapeutic Interventions/Progress Updates:    Pt received resting in bed, no c/o pain, and agreeable to therapy session.  Session focus on bed mobility, transfers, strengthening, and NMR.  Pt performed rolling/bridging in bed for lower body dressing with overall min assist and min verbal cues for technique.  Supine>side lying>sit with bed rail, supervision, and min verbal cues.  PT donned shoes max assist and pt performed squat/pivot from EOB>w/c with mod assist.  Nustep x10 minutes at level 5 for NMR, forced use, reciprocal stepping pattern, and overall cardiopulmonary endurance.  Pt participated in conversation regarding diagnosis, rehab, and recovery, asking appropriate questions which PT answered within scope of practice.  Pt returned to room in w/c at end of session and left upright with call bell in reach and needs met.   Therapy Documentation Precautions:  Precautions Precautions: Fall, Cervical Required Braces or Orthoses: Cervical Brace Cervical Brace: Hard collar Restrictions Weight Bearing Restrictions: No   See Function Navigator for Current Functional Status.   Therapy/Group: Individual Therapy  Earnest Conroy Penven-Crew 02/12/2016, 12:02 PM

## 2016-02-13 ENCOUNTER — Inpatient Hospital Stay (HOSPITAL_COMMUNITY): Payer: Medicare Other | Admitting: Physical Therapy

## 2016-02-13 ENCOUNTER — Inpatient Hospital Stay (HOSPITAL_COMMUNITY): Payer: Medicare Other | Admitting: Occupational Therapy

## 2016-02-13 ENCOUNTER — Inpatient Hospital Stay (HOSPITAL_COMMUNITY): Payer: Medicare Other | Admitting: Speech Pathology

## 2016-02-13 ENCOUNTER — Inpatient Hospital Stay (HOSPITAL_COMMUNITY): Payer: Medicare Other

## 2016-02-13 LAB — COMPREHENSIVE METABOLIC PANEL
ALBUMIN: 3 g/dL — AB (ref 3.5–5.0)
ALT: 15 U/L — AB (ref 17–63)
ANION GAP: 11 (ref 5–15)
AST: 22 U/L (ref 15–41)
Alkaline Phosphatase: 100 U/L (ref 38–126)
BUN: 24 mg/dL — ABNORMAL HIGH (ref 6–20)
CHLORIDE: 96 mmol/L — AB (ref 101–111)
CO2: 26 mmol/L (ref 22–32)
Calcium: 9.1 mg/dL (ref 8.9–10.3)
Creatinine, Ser: 0.76 mg/dL (ref 0.61–1.24)
GFR calc non Af Amer: >60 mL/min (ref >60)
Glucose, Bld: 210 mg/dL — ABNORMAL HIGH (ref 65–99)
Potassium: 4.7 mmol/L (ref 3.5–5.1)
SODIUM: 133 mmol/L — AB (ref 135–145)
Total Bilirubin: 1 mg/dL (ref 0.3–1.2)
Total Protein: 5.4 g/dL — ABNORMAL LOW (ref 6.5–8.1)

## 2016-02-13 LAB — CBC WITH DIFFERENTIAL/PLATELET
Basophils Absolute: 0 10*3/uL (ref 0.0–0.1)
Basophils Relative: 0 %
EOS ABS: 0 10*3/uL (ref 0.0–0.7)
Eosinophils Relative: 0 %
HEMATOCRIT: 37.9 % — AB (ref 39.0–52.0)
HEMOGLOBIN: 12.5 g/dL — AB (ref 13.0–17.0)
LYMPHS ABS: 1.2 10*3/uL (ref 0.7–4.0)
Lymphocytes Relative: 6 %
MCH: 30 pg (ref 26.0–34.0)
MCHC: 33 g/dL (ref 30.0–36.0)
MCV: 90.9 fL (ref 78.0–100.0)
MONOS PCT: 5 %
Monocytes Absolute: 1 10*3/uL (ref 0.1–1.0)
NEUTROS ABS: 16.4 10*3/uL — AB (ref 1.7–7.7)
NEUTROS PCT: 89 %
Platelets: 245 10*3/uL (ref 150–400)
RBC: 4.17 MIL/uL — AB (ref 4.22–5.81)
RDW: 12.3 % (ref 11.5–15.5)
WBC: 18.6 10*3/uL — AB (ref 4.0–10.5)

## 2016-02-13 LAB — GLUCOSE, CAPILLARY
GLUCOSE-CAPILLARY: 187 mg/dL — AB (ref 65–99)
Glucose-Capillary: 214 mg/dL — ABNORMAL HIGH (ref 65–99)
Glucose-Capillary: 205 mg/dL — ABNORMAL HIGH (ref 65–99)
Glucose-Capillary: 219 mg/dL — ABNORMAL HIGH (ref 65–99)

## 2016-02-13 MED ORDER — GLIMEPIRIDE 2 MG PO TABS
2.0000 mg | ORAL_TABLET | Freq: Every day | ORAL | Status: DC
  Administered 2016-02-14 – 2016-02-19 (×6): 2 mg via ORAL
  Filled 2016-02-13 (×6): qty 1

## 2016-02-13 MED ORDER — MEGESTROL ACETATE 400 MG/10ML PO SUSP
400.0000 mg | Freq: Two times a day (BID) | ORAL | Status: DC
  Administered 2016-02-13 – 2016-02-19 (×13): 400 mg via ORAL
  Filled 2016-02-13 (×13): qty 10

## 2016-02-13 MED ORDER — DEXAMETHASONE SODIUM PHOSPHATE 4 MG/ML IJ SOLN
2.0000 mg | Freq: Four times a day (QID) | INTRAMUSCULAR | Status: DC
Start: 2016-02-13 — End: 2016-02-15
  Filled 2016-02-13: qty 1

## 2016-02-13 MED ORDER — DEXAMETHASONE SODIUM PHOSPHATE 4 MG/ML IJ SOLN: 4.0000 mg | Freq: Four times a day (QID) | INTRAMUSCULAR | Status: DC

## 2016-02-13 MED ORDER — DEXAMETHASONE 2 MG PO TABS: 2.0000 mg | ORAL_TABLET | Freq: Four times a day (QID) | ORAL | Status: DC

## 2016-02-13 MED ORDER — DEXAMETHASONE 2 MG PO TABS
2.0000 mg | ORAL_TABLET | Freq: Four times a day (QID) | ORAL | Status: DC
  Administered 2016-02-13 – 2016-02-15 (×8): 2 mg via ORAL
  Filled 2016-02-13 (×8): qty 1

## 2016-02-13 NOTE — Progress Notes (Signed)
Social Work Assessment and Plan  Patient Details  Name: Gary Craig MRN: [id number] Date of Birth: January 07, 1934  Today's Date: 02/13/2016  Problem List:  Patient Active Problem List   Diagnosis Date Noted  . Essential hypertension 02/10/2016  . Spinal stenosis, cervical region 02/08/2016  . Surgery, other elective   . Spondylosis, cervical, with myelopathy   . Diabetes mellitus type 2 in nonobese (HCC)   . Coronary artery disease involving native coronary artery of native heart without angina pectoris   . History of CVA (cerebrovascular accident)   . Post-operative pain   . Dysphagia   . Leukocytosis   . Tachypnea   . Quadriparesis ([place]) 02/05/2016  . Myelopathy, spondylogenic, cervical 01/16/2016  . CVA (cerebral infarction) 05/31/2015   Past Medical History:  Past Medical History:  Diagnosis Date  . CAD (coronary artery disease)   . Carotid stenosis   . Diabetes mellitus without complication ([place])   . GERD (gastroesophageal reflux disease)    zantac as needed  . High cholesterol   . History of kidney stones   . Hypothyroidism   . S/P CABG (coronary artery bypass graft)   . Skin cancer   . Stroke [facility]) 2012   tia   Past Surgical History:  Past Surgical History:  Procedure Laterality Date  . ANTERIOR CERVICAL DECOMP/DISCECTOMY FUSION N/A 01/16/2016   Procedure: Cervical Four-Five Anterior cervical decompression/diskectomy/fusion;  Surgeon: Kary Kos, MD;  Location: [facility];  Service: Neurosurgery;  Laterality: N/A;  right side approach  . CERVICAL WOUND DEBRIDEMENT N/A 02/05/2016   Procedure: Exploration of Cervical four - five wound. Removal of cervical four - five instrumentation. Insertion of new cervical four - five instrumentation.;  Surgeon: Leeroy Cha, MD;  Location: [facility];  Service: Neurosurgery;  Laterality: N/A;  Exploration of Cervical four - five wound. Removal of cervical four - five instrumentation. Insertion of new cervical four - five  instrumentatio  . CORONARY ARTERY BYPASS GRAFT  11/29/2004  . EAR BIOPSY     skin ca  . LITHOTRIPSY Right   . POSTERIOR CERVICAL FUSION/FORAMINOTOMY N/A 02/08/2016   Procedure: CERVICAL FOUR-FIVE  Decompressive Laminectomy and Lateral Mass Fixation;  Surgeon: Kary Kos, MD;  Location: [facility];  Service: Neurosurgery;  Laterality: N/A;  . TONSILLECTOMY     Social History:  reports that he quit smoking about 33 years ago. His smoking use included Cigarettes, Pipe, and Cigars. He smoked 1.00 pack per day. He has never used smokeless tobacco. He reports that he drinks alcohol. He reports that he does not use drugs.  Family / Support Systems Marital Status: Married How Long?: almost 16 years Patient Roles: Spouse, Parent, Other (Comment) (church member) Spouse/Significant Other: Mcgwire Dasaro - wife - [phone] (h); [phone] (m) Children: Amie Critchley - dtr - [phone] Anticipated Caregiver: wife Ability/Limitations of Caregiver: Wife can assist. Caregiver Availability: 24/7 Family Dynamics: Pt's wife and children are supportive and will help him at home.  Pt also has good support from churches.  Social History Preferred language: English Religion: None Cultural Background: Pt was in the [employer] for 20 years. Education: Pt took some college courses. Read: Yes Write: Yes Employment Status: Retired Date Retired/Disabled/Unemployed: 1999 Age Retired: 35 Public relations account executive Issues: none reported Guardian/Conservator: N/A - MD has determined that pt is capable of making his own decisions.   Abuse/Neglect Physical Abuse: Denies Verbal Abuse: Denies Sexual Abuse: Denies Exploitation of patient/patient's resources: Denies Self-Neglect: Denies  Emotional Status Pt's  affect, behavior and adjustment status: Pt reports being tearful at times over what has happened, but overall feels positive about his recovery and now his tears are of gratitude for his progress  made and support and prayers he receives. Recent Psychosocial Issues: Pt with his 3rd surgery within a month.   Psychiatric History: none reported Substance Abuse History: none reported  Patient / Family Perceptions, Expectations & Goals Pt/Family understanding of illness & functional limitations: Pt was able to recount his multiple surgeries to CSW and seems to have a good understanding of his condition and limitations. Premorbid pt/family roles/activities: Pt likes to play golf, take walks, spend time with family, attend churches, sit on his porch with his wife, work in his garden. Anticipated changes in roles/activities/participation: Pt would like to resume these activities as soon as he is able. Pt/family expectations/goals: Pt wants to get movement back and be able to return home to enjoy his family/sit on his porch with his wife.  Community Resources Express Scripts: None Premorbid Home Care/DME Agencies: None (has a single point cane & grab bars in shower) Transportation available at discharge: family  Discharge Planning Living Arrangements: Spouse/significant other Support Systems: Spouse/significant other, Children, Other relatives, Friends/neighbors, Social worker community Type of Residence: Private residence Insurance Resources: [employer], Multimedia programmer (specify) Sports administrator) Financial Resources: Fish farm manager, Other (Comment) (retired Nature conservation officer) Museum/gallery curator Screen Referred: No Money Management: Patient, Spouse Does the patient have any problems obtaining your medications?: No Home Management: Pt and wife share the household chores.  Wife can manage while pt is recovering. Patient/Family Preliminary Plans: Pt plans to return home with his wife to provide intermittent supervision. Barriers to Discharge: Steps (3 to enter.  can live on main floor - has bedroom/bath.  office is upstairs.) Social Work Anticipated Follow Up Needs: HH/OP Expected length of stay: 10 to 14  days  Clinical Impression CSW met with pt to introduce self and role of CSW, as well as to complete assessment.  Pt was pleased to have made progress already, but admits to being tired today after having his first full day of therapy.  Pt's wife was not present, so CSW will meet her and confirm her ability to provide intermittent supervision and min assist with lower body dressing.  Pt is motivated to recover and is positive about his progress so far.  Pt's faith is important to him and he feels supported by all of the prayers that he's received and attributes this to keeping him out of pain.  Pt has great support from family, friends, and church family.  Pt is grateful to be on Rehab and is up for the challenge.  Pt asked that CSW call his PCP to inform them of his admission to [facility] did this today.  No other current questions/concers/needs.  CSW will continue to follow, meet pt's wife, and assist as needed.  Vertis Bauder, Silvestre Mesi 02/13/2016, 2:15 PM

## 2016-02-13 NOTE — Progress Notes (Signed)
Patient information reviewed and entered into eRehab system by Leldon Steege, RN, CRRN, PPS Coordinator.  Information including medical coding and functional independence measure will be reviewed and updated through discharge.     Per nursing patient was given "Data Collection Information Summary for Patients in Inpatient Rehabilitation Facilities with attached "Privacy Act Statement-Health Care Records" upon admission.  

## 2016-02-13 NOTE — Progress Notes (Signed)
Physical Therapy Session Note  Patient Details  Name: Gary Craig MRN: [id number] Date of Birth: 24-Mar-1934  Today's Date: 02/13/2016 PT Individual Time: 0900-1000 PT Individual Time Calculation (min): 60 min    Short Term Goals: Week 1:  PT Short Term Goal 1 (Week 1): Pt will increase bed mobility to S.  PT Short Term Goal 2 (Week 1): Pt will increase transfers bed to chair with appropriate assistive device to min A.  PT Short Term Goal 3 (Week 1): Pt will ambulate about 150 feet with rolling walker and min A.  PT Short Term Goal 4 (Week 1): Pt will ascend/descend 4 stairs with 2 rails and min A.  PT Short Term Goal 5 (Week 1): Pt will propel w/c greater than 150 feet with mod I.   Skilled Therapeutic Interventions/Progress Updates:   Pt received supine in bed, denies pain and agreeable to treatment. Supine in bed pt dons shorts with S and increased time due to initially threading LEs into incorrect legs. Supine>sit with S using bedrails and increased time. Sit <>stand and stand pivot transfer >w/c with modA. In standing before transfer, pt used BUEs to tuck shirt in; mod/maxA for standing balance with anterior bias. Seated at sink pt performed oral hygiene with S and increased time due to decreased [facility].  W/c propulsion BUE x150' for BUE coordination, S. Gait x40' no AD with mod/maxA, x40' with RW and minA, x90' with RW and minA. Gait weaving around cones with RW x60' total; minA overall with mod cues for RW management. Educated pt in safety, recommendation that pt wait for assist prior to getting up, goal of progressing from RW to no AD; pt agreeable to all the above. Gait to return to room with RW and minA; remained seated in w/c at end of session, all needs in reach and RN present.   Therapy Documentation Precautions:  Precautions Precautions: Fall, Cervical Required Braces or Orthoses: Cervical Brace Cervical Brace: Hard collar Restrictions Weight Bearing Restrictions: No   See  Function Navigator for Current Functional Status.   Therapy/Group: Individual Therapy  [name] 02/13/2016, 10:02 AM

## 2016-02-13 NOTE — Progress Notes (Signed)
Inpatient Rehabilitation Center Individual Statement of Services  Patient Name:  Gary Craig  Date:  02/13/2016  Welcome to the [facility].  Our goal is to provide you with an individualized program based on your diagnosis and situation, designed to meet your specific needs.  With this comprehensive rehabilitation program, you will be expected to participate in at least 3 hours of rehabilitation therapies Monday-Friday, with modified therapy programming on the weekends.  Your rehabilitation program will include the following services:  Physical Therapy (PT), Occupational Therapy (OT), Speech Therapy (ST), 24 hour per day rehabilitation nursing, Case Management (Social Worker), Rehabilitation Medicine, Nutrition Services and Pharmacy Services  Weekly team conferences will be held on Tuesdays to discuss your progress.  Your Social Worker will talk with you frequently to get your input and to update you on team discussions.  Team conferences with you and your family in attendance may also be held.  Expected length of stay: 10 to 14 days  Overall anticipated outcome: Modified Independent with supervision for showers, meal prep, and stairs; minimal assistance for lower body dressing  Depending on your progress and recovery, your program may change. Your Social Worker will coordinate services and will keep you informed of any changes. Your Social Worker's name and contact numbers are listed  below.  The following services may also be recommended but are not provided by the [facility] will be made to provide these services after discharge if needed.  Arrangements include referral to agencies that provide these services.  Your insurance has been verified to be:  Medicare and Tricare Your primary doctor is:  Dr. Dion Body  Pertinent  information will be shared with your doctor and your insurance company.  Social Worker:  Alfonse Alpers, LCSW  [phone] or ([phone]  Information discussed with and copy given to patient by: Trey Sailors, 02/13/2016, 2:23 PM

## 2016-02-13 NOTE — Progress Notes (Signed)
Speech Language Pathology Daily Session Note  Patient Details  Name: Gary Craig MRN: [id number] Date of Birth: 08/24/1933  Today's Date: 02/13/2016 SLP Individual Time: 1300-1400 SLP Individual Time Calculation (min): 60 min   Short Term Goals: Week 1: SLP Short Term Goal 1 (Week 1): Pt will consume dys 2 textures and thin liquids with mod I use of swallowing precautions and minimal overt s/s of aspiration.  SLP Short Term Goal 2 (Week 1): Pt will consume therapeutic trials of Dys. 3 textures with mod I use of swallowing precautions and minimal overt s/s of aspiration over 3 consecutive session prior to advancement.    Skilled Therapeutic Interventions: Skilled treatment session focused on dysphagia goals. SLP facilitated session by providing Mod A verbal cues for use of small bites/sips and intermittent cough/throat clear throughout lunch meal of Dys. 3 textures with thin liquids. Patient with overt s/s of aspiration with both thin liquids and solid textures with intermittent globus sensation, therefore, recommend patient downgrade to Dys. 2 textures to maximize safety. Educated both the patient and his wife in regards to current swallowing function by reviewing MBS video and appropriate textures with a handout to reinforce information. Both verbalized understanding. Patient left upright in wheelchair with family present. Continue with current plan of care.  Of note, patient observed eating a pop-tart in dayroom with overt s/s of aspiration with family present. SLP re-educated patient and family in regards to appropriate textures. All verbalized understanding and pop-tart was discarded.   Function:  Eating Eating   Modified Consistency Diet: Yes Eating Assist Level: More than reasonable amount of time;Set up assist for;Supervision or verbal cues   Eating Set Up Assist For: Opening containers       Cognition Comprehension Comprehension assist level: Follows complex  conversation/direction with no assist  Expression   Expression assist level: Expresses complex ideas: With no assist  Social Interaction Social Interaction assist level: Interacts appropriately with others - No medications needed.  Problem Solving Problem solving assist level: Solves complex 90% of the time/cues < 10% of the time  Memory Memory assist level: Recognizes or recalls 90% of the time/requires cueing < 10% of the time    Pain No/Denies Pain   Therapy/Group: Individual Therapy  Annaly Skop 02/13/2016, 3:16 PM

## 2016-02-13 NOTE — Progress Notes (Signed)
Occupational Therapy Session Note  Patient Details  Name: Gary Craig MRN: [id number] Date of Birth: September 15, 1933  Today's Date: 02/13/2016 OT Individual Time: 1045-1200 OT Individual Time Calculation (min): 75 min     Short Term Goals:Week 1:  OT Short Term Goal 1 (Week 1): Complete toileting with min assist for hygiene thoroughness OT Short Term Goal 2 (Week 1): Bathe upper body with supervision OT Short Term Goal 3 (Week 1): Demo ability to complete HEP to improve FMC of both hands OT Short Term Goal 4 (Week 1): Demo ability to use AE, prn, during lower body dressing to don pants with min assist  Skilled Therapeutic Interventions/Progress Updates:    Pt seen for OT ADL bathing/dressing session. Pt sitting up in w/c upon arrival, agreeable to tx session. He ambulated throughout session using RW with min-mod A with VCs provided foot placement. He completed toileting transfers with increased assist for controlled sit due to decreased decentric control. He ambulated to shower and completed showering task seated on tub bench.  He returned to supine for cervical brace pads to be changed. He dressed seated EOB, with increased assist required due to decreased functional grasp and LE weakness. Grooming completed seated in w/c at sink with built up gripper placed on razor.  Pt left sitting in w/c at end of session, all needs in reach.   Therapy Documentation Precautions:  Precautions Precautions: Fall, Cervical Required Braces or Orthoses: Cervical Brace Cervical Brace: Hard collar Restrictions Weight Bearing Restrictions: No Pain: Pain Assessment Pain Assessment: No/denies pain Pain Score: 0-No pain ADL: ADL ADL Comments: see Functional Assessment Tool  See Function Navigator for Current Functional Status.   Therapy/Group: Individual Therapy  [name], Nakaya Mishkin C 02/13/2016, 7:20 AM

## 2016-02-13 NOTE — IPOC Note (Signed)
Overall Plan of Care [facility]) Patient Details Name: Gary Craig MRN: [id number] DOB: 03/29/1934  Admitting Diagnosis: Spinal Sgtenosis  Hospital Problems: Principal Problem:   Spondylosis, cervical, with myelopathy Active Problems:   Quadriparesis ([place])   Diabetes mellitus type 2 in nonobese [facility])   Dysphagia   Essential hypertension     Functional Problem List: Nursing Bowel, Endurance, Medication Management, Motor, Nutrition, Pain, Safety, Sensory  PT Balance, Endurance, Motor  OT Balance, Safety, Sensory, Endurance, Motor  SLP Nutrition  TR         Basic ADL's: OT Eating, Grooming, Bathing, Dressing, Toileting     Advanced  ADL's: OT Simple Meal Preparation     Transfers: PT Bed Mobility, Bed to Chair, Car  OT Toilet, Tub/Shower     Locomotion: PT Ambulation, Emergency planning/management officer, Stairs     Additional Impairments: OT    SLP Swallowing      TR      Anticipated Outcomes Item Anticipated Outcome  Self Feeding Mod I  Swallowing  mod I   Basic self-care  Supervision  Toileting  Mod I   Bathroom Transfers Supervision  Bowel/Bladder  Minimal assist  Transfers  mod I transfers  Locomotion  mod I w/c mobility, mod I ambulation, S stairs.   Communication     Cognition     Pain  3 or less  Safety/Judgment  minimal assist   Therapy Plan: PT Intensity: Minimum of 1-2 x/day ,45 to 90 minutes PT Frequency: 5 out of 7 days PT Duration Estimated Length of Stay: 12 to 14 days OT Intensity: Minimum of 1-2 x/day, 45 to 90 minutes OT Frequency: 5 out of 7 days OT Duration/Estimated Length of Stay: 10-14 days SLP Intensity: Minumum of 1-2 x/day, 30 to 90 minutes SLP Frequency: 3 to 5 out of 7 days SLP Duration/Estimated Length of Stay: 10-14 days        Team Interventions: Nursing Interventions Patient/Family Education, Bowel Management, Pain Management, Medication Management, Skin Care/Wound Management, Dysphagia/Aspiration Precaution Training,  Disease Management/Prevention, Discharge Planning  PT interventions Balance/vestibular training, Ambulation/gait training, Discharge planning, Community reintegration, DME/adaptive equipment instruction, Functional electrical stimulation, Functional mobility training, Neuromuscular re-education, Splinting/orthotics, Stair training, Therapeutic Activities, Therapeutic Exercise, UE/LE Strength taining/ROM, UE/LE Coordination activities, Wheelchair propulsion/positioning  OT Interventions Discharge planning, Training and development officer, Self Care/advanced ADL retraining, Therapeutic Activities, UE/LE Coordination activities, Therapeutic Exercise, UE/LE Strength taining/ROM, Patient/family education, DME/adaptive equipment instruction, Neuromuscular re-education, Functional mobility training  SLP Interventions Dysphagia/aspiration precaution training, Patient/family education  TR Interventions    SW/CM Interventions Discharge Planning, Psychosocial Support, Patient/Family Education    Team Discharge Planning: Destination: PT-Home ,OT- Home , SLP-Home Projected Follow-up: PT-Home health PT, OT-  Home health OT, SLP-Other (comment) (TBD pending progress made while inpatient) Projected Equipment Needs: PT-To be determined, OT- To be determined, SLP-None recommended by SLP Equipment Details: PT- , OT-  Patient/family involved in discharge planning: PT- Patient,  OT-Patient, SLP-Patient  MD ELOS: 11-14 days Medical Rehab Prognosis:  Excellent Assessment: The patient has been admitted for CIR therapies with the diagnosis of cervical myelopathy with tetraplegia/sensory loss. The team will be addressing functional mobility, strength, stamina, balance, safety, adaptive techniques and equipment, self-care, bowel and bladder mgt, patient and caregiver education, NMR, ego support, community reintegration, ongoing ego support. Goals have been set at mod I for mobility and self-care---he's quite motivated.     Meredith Staggers, MD, FAAPMR      See Team Conference Notes for weekly updates to the plan of care

## 2016-02-13 NOTE — Progress Notes (Signed)
Occupational Therapy Session Note  Patient Details  Name: Gary Craig MRN: [id number] Date of Birth: 06/04/1934  Today's Date: 02/13/2016 OT Individual Time: 1401-1431 OT Individual Time Calculation (min): 30 min     Skilled Therapeutic Interventions/Progress Updates:    Pt worked on [employer] coordination skills with use of his electronic tablet.  Worked on isolation of the right index finger for typing and accessing different screens.  Pt encouraged to flex all fingers except the index finger with attempted use.  Increased time needed to complete this.  Educated pt and family on activities such as sorting cards or picking up checkers to increased coordination as well.  Pt instructed to download FM coordination game on tablet also.  Pt left sitting in wheelchair with family present.   Therapy Documentation Precautions:  Precautions Precautions: Fall, Cervical Required Braces or Orthoses: Cervical Brace Cervical Brace: Hard collar Restrictions Weight Bearing Restrictions: No  Pain: Pain Assessment Pain Assessment: No/denies pain ADL: See Function Navigator for Current Functional Status.   Therapy/Group: Individual Therapy  Jolon Degante OTR/L 02/13/2016, 4:19 PM

## 2016-02-13 NOTE — Progress Notes (Signed)
[facility]     PROGRESS NOTE  Subjective/Complaints:  Talking with SLP. Concerned by appetite. Doesn't like glucophage--makes him nauseas and decreases appetite. Sugars still high. .   ROS: Denies CP, SOB, N/V/D.  Objective: Vital Signs: Blood pressure 134/62, pulse 72, temperature 97.7 F (36.5 C), temperature source Oral, resp. rate 18, height 5\' 8"  (1.727 m), weight 67.7 kg (149 lb 4.8 oz), SpO2 99 %. No results found.  Recent Labs  02/13/16 0646  WBC 18.6*  HGB 12.5*  HCT 37.9*  PLT 245    Recent Labs  02/13/16 0646  NA 133*  K 4.7  CL 96*  GLUCOSE 210*  BUN 24*  CREATININE 0.76  CALCIUM 9.1   CBG (last 3)   Recent Labs  02/12/16 1617 02/12/16 2134 02/13/16 0623  GLUCAP 234* 229* 214*    Wt Readings from Last 3 Encounters:  02/10/16 67.7 kg (149 lb 4.8 oz)  02/09/16 70.9 kg (156 lb 4.9 oz)  02/05/16 72.6 kg (160 lb)    Physical Exam:  BP 134/62 (BP Location: Right Arm)   Pulse 72   Temp 97.7 F (36.5 C) (Oral)   Resp 18   Ht 5\' 8"  (1.727 m)   Wt 67.7 kg (149 lb 4.8 oz)   SpO2 99%   BMI 22.70 kg/m  Constitutional: He appears well-developedand well-nourished. NAD.  HENT: Normocephalic.  Right Ear: External earnormal.  Left Ear: External earnormal.  Eyes: Conjunctivaeand EOMare normal.  Neck: Cervical collar in place Cardiovascular: Normal rateand regular rhythm. no murmurs or rubs Respiratory: Effort normaland breath sounds normal. No respiratory distress. No wheezes or rhonchi GI: Soft. Bowel sounds are normal. He exhibits no distension. Non tender. Musculoskeletal: He exhibits no edemaor tenderness.  Neurological: He is alertand oriented.  Motor: RUE: 4+/5 deltoid, bicep, tricep, wrist and HI 4-4+/5.  LUE: 4/5 proximal to distal RLE 3+/5 HF, KE and 4/5 ADF/PF.  LUE: 4/5 HF, KE, 4+/5 prox to distal Decreased FMC RUE and RLE.  Skin: Skin is warmand dry.  Surgical site is dressed with drain  in place Psychiatric: He has a normal mood and affect. His behavior is normal   Assessment/Plan: 1. 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 Physiatrist is providing close team supervision and 24 hour management of active medical problems listed below. Physiatrist and rehab team continue to assess barriers to discharge/monitor patient progress toward functional and medical goals.  Function:  Bathing Bathing position   Position: Wheelchair/chair at sink  Bathing parts Body parts bathed by patient: Right arm, Left arm, Chest, Abdomen, Front perineal area Body parts bathed by helper: Buttocks, Back  Bathing assist Assist Level: Touching or steadying assistance(Pt > 75%)      Upper Body Dressing/Undressing Upper body dressing   What is the patient wearing?: Pull over shirt/dress     Pull over shirt/dress - Perfomed by patient: Thread/unthread right sleeve, Thread/unthread left sleeve, Put head through opening, Pull shirt over trunk Pull over shirt/dress - Perfomed by helper: Put head through opening, Pull shirt over trunk        Upper body assist Assist Level:  (Mod Assist)      Lower Body Dressing/Undressing Lower body dressing   What is the patient wearing?: Pants, Underwear, Shoes Underwear - Performed by patient: Thread/unthread right underwear leg, Thread/unthread left underwear leg, Pull underwear up/down Underwear - Performed by helper: Thread/unthread right underwear  leg, Thread/unthread left underwear leg, Pull underwear up/down Pants- Performed by patient: Thread/unthread right pants leg, Thread/unthread left pants leg, Pull pants up/down Pants- Performed by helper: Fasten/unfasten pants           Shoes - Performed by helper: Don/doff right shoe, Don/doff left shoe, Fasten right, Fasten left (sitting EOB)          Lower body assist  Assist for lower body dressing:  (Max Assist)      Toileting Toileting     Toileting steps completed by helper: Adjust clothing prior to toileting, Performs perineal hygiene, Adjust clothing after toileting    Toileting assist Assist level:  (Max Assist)   Transfers Chair/bed transfer   Chair/bed transfer method: Squat pivot, Stand pivot Chair/bed transfer assist level: Moderate assist (Pt 50 - 74%/lift or lower) Chair/bed transfer assistive device: Armrests     Locomotion Ambulation     Max distance: 50 Assist level: Maximal assist (Pt 25 - 49%)   Wheelchair   Type: Manual Max wheelchair distance: 150 Assist Level: Supervision or verbal cues  Cognition Comprehension Comprehension assist level: Follows complex conversation/direction with no assist  Expression Expression assist level: Expresses complex ideas: With no assist  Social Interaction Social Interaction assist level: Interacts appropriately with others - No medications needed.  Problem Solving Problem solving assist level: Solves complex problems: Recognizes & self-corrects  Memory Memory assist level: Complete Independence: No helper     Medical Problem List and Plan: 1. 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 Status post ACDF 01/16/2016 with postoperative epidural hematoma status post exploration and fusion removal of plate and bone graft 02/08/2016 followed by posterior cervical decompression 02/08/2016. Cervical collar at all times  Cont CIR, motivated  -provided prognostic education/positive reinforcement 2.  DVT Prophylaxis/Anticoagulation: SCDs/mobility.   Vascular studies negative 3. Pain Management: Tylenol as needed.                        -denies any pain at present even at op site 4. Diabetes mellitus. Hemoglobin A1c 6.4.   Glucophage 500 mg BID, increased to 1000 BID on 8/27   -doesn't like glucophage as it causes nauses/decreased appetite  -dc and begin amaryl  -wean  steroids  Check blood sugars before meals and at bedtime 5. Neuropsych: This patient is capable of making decisions on his own behalf. 6. Skin/Wound Care: Routine skin checks 7. Fluids/Electrolytes/Nutrition:   -ALL labs personally reviewed. 8. Dysphagia. Dysphagia #3  thin liquid diet                       -follow up per SLP 9. Hypertension: Improved  Lisinopril 2.5 mg daily increased to 5mg  on 8/26  Monitor with increased mobility 10. Hypothyroidism. Continue Synthroid. 11. Hyperlipidemia. Lipitor   LOS (Days) 3 A FACE TO FACE EVALUATION WAS PERFORMED  SWARTZ,ZACHARY T 02/13/2016 9:03 AM

## 2016-02-13 NOTE — Progress Notes (Signed)
Occupational Therapy Note  Patient Details  Name: Gary Craig MRN: [id number] Date of Birth: Jun 24, 1933  Today's Date: 02/13/2016 OT Individual Time: 1000-1030 OT Individual Time Calculation (min): 30 min   Pt c/o discomfort in neck ("because of the collar"); emotional support, RN aware and premedicated Individual Therapy  Pt resting in w/c upon arrival. Pt amb with RW to ADL apartment and practiced stepping over into tub to simulate home environment.  Pt required max A with max verbal cues for sequencing and safety awareness.  Pt noted with decreased BLE proprioception.  Pt required max verbal cues for sequencing and RW when preparing to sit in chairs/sofa.  Focus on functional transfers and functional amb with RW to increase independence with BADLs.  Leotis [facility] 02/13/2016, 11:41 AM

## 2016-02-14 ENCOUNTER — Inpatient Hospital Stay (HOSPITAL_COMMUNITY): Payer: Medicare Other | Admitting: Occupational Therapy

## 2016-02-14 ENCOUNTER — Inpatient Hospital Stay (HOSPITAL_COMMUNITY): Payer: Medicare Other | Admitting: Physical Therapy

## 2016-02-14 ENCOUNTER — Inpatient Hospital Stay (HOSPITAL_COMMUNITY): Payer: Medicare Other | Admitting: Speech Pathology

## 2016-02-14 LAB — GLUCOSE, CAPILLARY
GLUCOSE-CAPILLARY: 111 mg/dL — AB (ref 65–99)
GLUCOSE-CAPILLARY: 227 mg/dL — AB (ref 65–99)
Glucose-Capillary: 168 mg/dL — ABNORMAL HIGH (ref 65–99)
Glucose-Capillary: 203 mg/dL — ABNORMAL HIGH (ref 65–99)

## 2016-02-14 NOTE — Progress Notes (Signed)
Speech Language Pathology Daily Session Note  Patient Details  Name: Gary Craig MRN: [id number] Date of Birth: 1934/03/18  Today's Date: 02/14/2016 SLP Individual Time: 0831-0900 SLP Individual Time Calculation (min): 29 min   Short Term Goals: Week 1: SLP Short Term Goal 1 (Week 1): Pt will consume dys 2 textures and thin liquids with mod I use of swallowing precautions and minimal overt s/s of aspiration.  SLP Short Term Goal 2 (Week 1): Pt will consume therapeutic trials of Dys. 3 textures with mod I use of swallowing precautions and minimal overt s/s of aspiration over 3 consecutive session prior to advancement.    Skilled Therapeutic Interventions:   Skilled treatment session focused on addressing dysphagia goals. SLP facilitated session by providing skilled observation of patient consuming Dys.3 textures due to his request to be upgraded.  Patient had limited recall/awarenss of difficulty that was noted in yesterday's therapy note.  Patient demonstrated consistent throat clears when Dys.3 textures were followed by thin liquid sips and intermittent hocking.  RN present at end of session to administer medication and despite education and SLP recommendation patient elected to consume meds whole with thin liquids.  One large pill required many consecutive sips to reduce globus sensation.  Continue with current plan of care.    Function:  Eating Eating   Modified Consistency Diet: Yes Eating Assist Level: More than reasonable amount of time;Set up assist for;Supervision or verbal cues   Eating Set Up Assist For: Opening containers       Cognition Comprehension Comprehension assist level: Follows complex conversation/direction with no assist  Expression   Expression assist level: Expresses complex ideas: With no assist  Social Interaction Social Interaction assist level: Interacts appropriately with others - No medications needed.  Problem Solving Problem solving assist level:  Solves complex 90% of the time/cues < 10% of the time  Memory Memory assist level: Recognizes or recalls 90% of the time/requires cueing < 10% of the time    Pain Pain Assessment Pain Assessment: 0-10 Pain Score: 4  Pain Type: Surgical pain Pain Location: Neck Pain Orientation: Posterior Pain Descriptors / Indicators: Aching Pain Onset: On-going Patients Stated Pain Goal: 2 Pain Intervention(s): RN made aware Multiple Pain Sites: No  Therapy/Group: Individual Therapy  Carmelia Roller., [place] [id number]  [place] 02/14/2016, 8:46 AM

## 2016-02-14 NOTE — Progress Notes (Signed)
Occupational Therapy Session Note  Patient Details  Name: Gary Craig MRN: [id number] Date of Birth: 25-Jun-1933  Today's Date: 02/14/2016 OT Individual Time: 1100-1200 OT Individual Time Calculation (min): 60 min     Short Term Goals:Week 1:  OT Short Term Goal 1 (Week 1): Complete toileting with min assist for hygiene thoroughness OT Short Term Goal 2 (Week 1): Bathe upper body with supervision OT Short Term Goal 3 (Week 1): Demo ability to complete HEP to improve FMC of both hands OT Short Term Goal 4 (Week 1): Demo ability to use AE, prn, during lower body dressing to don pants with min assist  Skilled Therapeutic Interventions/Progress Updates:    Pt seen for OT ADL bathing/dressing session. Pt sitting up in w/Craig upon arrival with daughter present, ready for shower. He ambulated throughout room with RW and CGA.  Occasional assist required for management of RW and VCs for foot clearance. He bathed seated on tub bench and standing with use of grab bars. Today, he was able to cross ankle over knee to wash B feet, an improvement from previous sessions.  He returned to bed and cervical brace pads changed and shaving completed total A. Pt's daughter educated regarding care and management of cervical brace pads. Pt returned to EOB to dress, requiring VCs for problem solving positioning of foot to tie shoes, successfully able to do it with foot propped up on w/Craig.  Pt ambulated to therapy day room and pt left sitting in w/Craig with daughter present for unit lunch.  Educated regarding current deficits of sensory and fine motor coordination and functonal implications, cervical brace and precautions, DME, and d/Craig planning.  Therapy Documentation Precautions:  Precautions Precautions: Fall, Cervical Required Braces or Orthoses: Cervical Brace Cervical Brace: Hard collar Restrictions Weight Bearing Restrictions: No Pain: Pain Assessment Pain Assessment: 0-10 Pain Score: 0-No  pain  ADL: ADL ADL Comments: see Functional Assessment Tool  See Function Navigator for Current Functional Status.   Therapy/Group: Individual Therapy  Gary Craig 02/14/2016, 7:05 AM

## 2016-02-14 NOTE — Progress Notes (Signed)
Speech Language Pathology Daily Session Notes  Patient Details  Name: Gary Craig MRN: [id number] Date of Birth: 1933/08/14  Today's Date: 02/14/2016  Session 1: SLP Individual Time: [id number] SLP Individual Time Calculation (min): 25 min   Session 1: SLP Individual Time: [id number] SLP Individual Time Calculation (min): 23min   Short Term Goals: Week 1: SLP Short Term Goal 1 (Week 1): Pt will consume dys 2 textures and thin liquids with mod I use of swallowing precautions and minimal overt s/s of aspiration.  SLP Short Term Goal 2 (Week 1): Pt will consume therapeutic trials of Dys. 3 textures with mod I use of swallowing precautions and minimal overt s/s of aspiration over 3 consecutive session prior to advancement.    Skilled Therapeutic Interventions:  Session 1: Skilled treatment session focused on dysphagia goals. SLP facilitated session by providing skilled observation with lunch meal of Dys. 2 textures with thin liquids. Patient demonstrated use of multiple swallows with intermittent cough/throat clearing, suspect increased overt s/s of aspiration was due to engaging in a functional conversation with other staff members. Recommend patient continue current diet. Patient left upright in wheelchair with family present. Continue with current plan of care.   Session 2: Skilled treatment session focused on continued patient and family education with the patient's daughter to reinforce his current swallowing function, diet recommendations, medication administration and swallowing compensatory strategies. Patient and daughter asking appropriate questions and MBS was showed again to reinforce clinician reasoning. All verbalized understanding. Patient left upright in wheelchair with all needs within reach. Continue with current plan of care.   Function:  Eating Eating   Modified Consistency Diet: Yes Eating Assist Level: More than reasonable amount of time;Set up assist for;Supervision  or verbal cues   Eating Set Up Assist For: Opening containers       Cognition Comprehension Comprehension assist level: Follows complex conversation/direction with no assist  Expression   Expression assist level: Expresses complex ideas: With no assist  Social Interaction Social Interaction assist level: Interacts appropriately with others - No medications needed.  Problem Solving Problem solving assist level: Solves complex 90% of the time/cues < 10% of the time  Memory Memory assist level: Recognizes or recalls 90% of the time/requires cueing < 10% of the time    Pain Pain Assessment Pain Score: 0-No pain  Therapy/Group: Individual Therapy  Cierra Rothgeb 02/14/2016, 1:55 PM

## 2016-02-14 NOTE — Progress Notes (Signed)
Physical Therapy Session Note  Patient Details  Name: BILL WIED MRN: [id number] Date of Birth: 1934/06/11  Today's Date: 02/14/2016 PT Individual Time: 0900-1015 and 1455-1525 PT Individual Time Calculation (min): 75 min and 30 min (total 105 min)    Short Term Goals: Week 1:  PT Short Term Goal 1 (Week 1): Pt will increase bed mobility to S.  PT Short Term Goal 2 (Week 1): Pt will increase transfers bed to chair with appropriate assistive device to min A.  PT Short Term Goal 3 (Week 1): Pt will ambulate about 150 feet with rolling walker and min A.  PT Short Term Goal 4 (Week 1): Pt will ascend/descend 4 stairs with 2 rails and min A.  PT Short Term Goal 5 (Week 1): Pt will propel w/c greater than 150 feet with mod I.   Skilled Therapeutic Interventions/Progress Updates:   Tx 1: Pt received seated in w/c, denies pain and agreeable to treatment. W/c propulsion to gym with BUE for strengthening and coordination with S. Stand pivot transfer modA to mat table. 2x7 modified situps with wedge and modA from therapist for core strengthening. Sit <>stand 2x10 reps min/modA for standing balance. Seated scapular rowing 2x15 with level 1 light orange theraband. Seated/supine LE strengthening exercises 1x12-15 reps each with education in periodization and increase in reps/sets as pt improves. Pt given handout with pictures and written directions for all seated/supine exercises to allow pt to perform outside of normal therapy sessions. Returned to room with gait x125' RW and minA, min cues for RW management for safety. Remained seated in w/c at end of session, all needs in reach.   Tx 2: Pt received seated in w/c, denies pain and agreeable to treatment. Gait to/from gym with RW and min guard x125' each; occasional R foot drag and mild LOB requiring minA to recover. Educated pt and family on R foot drop and potential for needing AFO but plan to reassess as pt progresses. Seated on mat table, performed BUE  reaching to match cards to board for Howard coordination. Performed in standing also for dynamic standing and UE reaching; minA for standing balance with several anterior LOB requiring assist to recover. Standing alternating toe taps to 3" step with min/modA for dynamic balance. Returned to room as described above; remained seated in recliner at end of session all needs in reach.   Therapy Documentation Precautions:  Precautions Precautions: Fall, Cervical Required Braces or Orthoses: Cervical Brace Cervical Brace: Hard collar Restrictions Weight Bearing Restrictions: No Pain: Pain Assessment Pain Assessment: 0-10 Pain Score: 0-No pain Pain Type: Surgical pain Pain Location: Neck Pain Orientation: Posterior Pain Descriptors / Indicators: Aching Pain Onset: On-going Patients Stated Pain Goal: 2 Pain Intervention(s): Medication (See eMAR) Multiple Pain Sites: No   See Function Navigator for Current Functional Status.   Therapy/Group: Individual Therapy  [name] 02/14/2016, 11:02 AM

## 2016-02-14 NOTE — Patient Care Conference (Signed)
Inpatient RehabilitationTeam Conference and Plan of Care Update Date: 02/14/2016   Time: 2:00 PM    Patient Name: Gary Craig      Medical Record Number: [id number]  Date of Birth: 1933-12-18 Sex: Male         Room/Bed: 4W07C/4W07C-01 Payor Info: Payor: MEDICARE / Plan: MEDICARE PART A AND B / Product Type: *No Product type* /    Admitting Diagnosis: Spinal Sgtenosis  Admit Date/Time:  02/10/2016  3:40 PM Admission Comments: No comment available   Primary Diagnosis:  Spondylosis, cervical, with myelopathy Principal Problem: Spondylosis, cervical, with myelopathy  Patient Active Problem List   Diagnosis Date Noted  . Essential hypertension 02/10/2016  . Spinal stenosis, cervical region 02/08/2016  . Surgery, other elective   . Spondylosis, cervical, with myelopathy   . Diabetes mellitus type 2 in nonobese (HCC)   . Coronary artery disease involving native coronary artery of native heart without angina pectoris   . History of CVA (cerebrovascular accident)   . Post-operative pain   . Dysphagia   . Leukocytosis   . Tachypnea   . Quadriparesis ([place]) 02/05/2016  . Myelopathy, spondylogenic, cervical 01/16/2016  . CVA (cerebral infarction) 05/31/2015    Expected Discharge Date: Expected Discharge Date: 02/24/16  Team Members Present: Physician leading conference: Dr. Alger Simons Social Worker Present: Alfonse Alpers, LCSW Nurse Present: Heather Roberts, RN PT Present: Kem Parkinson, PT OT Present: Napoleon Form, OT SLP Present: Weston Anna, SLP PPS Coordinator present : Daiva Nakayama, RN, CRRN     Current Status/Progress Goal Weekly Team Focus  Medical   cervical myelopathy. sensory deficits,strength improving, continent bowel and bladder  improve safety, slowing down  iimprove patience, awareness, ongoing ed, manage diet/swallow   Bowel/Bladder   continent of bowel/bladder. LBM 02/13/16  patient to remain continent of bowel/bladder while in [facility] with min assist  Monitor  bowel/bladder functions q shifty and as needed   Swallow/Nutrition/ Hydration   Dys. 2 textures with thin liquids, Intermittent supervision   Mod I   Tolerance of current diet, increased use of strategies, possible trials of upgraded textures    ADL's   Mod A LB dressing; Set-up UB bathing/dressing; Mod A functional transfers; mod A Toileting  Supervision- mod I overall  ADL re-training, fine motor coordination, core strengthening/ stability, activity tolerance   Mobility   S bed mobility, modA sit <>stand and stand pivot transfers, min/modA gait with RW  modI bed mobility and transfers, S gait in home and community, stairs  dynamic standing balance, coordination, gait training, NMR   Communication             Safety/Cognition/ Behavioral Observations            Pain   No complain of pain  <3  monitor and treat pain q shift and as needed   Skin   Surgical incision on both front and back of the neck  Patient skin to be free of new skin breakdown/infection  Monitor skin q shift and as needed    Rehab Goals Patient on target to meet rehab goals: Yes Rehab Goals Revised: none - pt's first conference *See Care Plan and progress notes for long and short-term goals.  Barriers to Discharge: impulsivity, proprioceptive deficits    Possible Resolutions to Barriers:  ongoing education and training forpatient and family    Discharge Planning/Teaching Needs:  Pt plans to return to his home at d/c with his wife to provide supervision as needed.  Wife can attend  family education closer to d/c.  Wife does not drive to hospital, so will need to coordinate with her when she can get a ride here.   Team Discussion:  Pt is progressing nicely.  He has some sensory deficits, but Dr. Naaman Plummer feels he should do well in rehab.  Pt has no pain, bowel and bladder working, and appetite is better with addition of megace.  Pt still with swallowing issues and SLP feels pt/family will need constant re-education  about diet.  Recommending medications be taken whole in purees.  Pt with some strength deficits and ataxia per PT.  OT reports pt is min to mod overall with cueing needed for safety.  Revisions to Treatment Plan:  none   Continued Need for Acute Rehabilitation Level of Care: The patient requires daily medical management by a physician with specialized training in physical medicine and rehabilitation for the following conditions: Daily direction of a multidisciplinary physical rehabilitation program to ensure safe treatment while eliciting the highest outcome that is of practical value to the patient.: Yes Daily medical management of patient stability for increased activity during participation in an intensive rehabilitation regime.: Yes Daily analysis of laboratory values and/or radiology reports with any subsequent need for medication adjustment of medical intervention for : Neurological problems;Post surgical problems;Other  Betzayda Braxton, Silvestre Mesi 02/14/2016, 2:55 PM

## 2016-02-14 NOTE — Progress Notes (Signed)
[place] PHYSICAL MEDICINE & REHABILITATION     PROGRESS NOTE  Subjective/Complaints:  Appetite better. Happy with progress. Realizes this is a "journey". Slept well.   ROS: Denies CP, SOB, N/V/D.  Objective: Vital Signs: Blood pressure 118/63, pulse 60, temperature 98.3 F (36.8 C), temperature source Oral, resp. rate 18, height 5\' 8"  (1.727 m), weight 67.7 kg (149 lb 4.8 oz), SpO2 98 %. No results found.  Recent Labs  02/13/16 0646  WBC 18.6*  HGB 12.5*  HCT 37.9*  PLT 245    Recent Labs  02/13/16 0646  NA 133*  K 4.7  CL 96*  GLUCOSE 210*  BUN 24*  CREATININE 0.76  CALCIUM 9.1   CBG (last 3)   Recent Labs  02/13/16 1634 02/13/16 2113 02/14/16 0615  GLUCAP 205* 219* 168*    Wt Readings from Last 3 Encounters:  02/10/16 67.7 kg (149 lb 4.8 oz)  02/09/16 70.9 kg (156 lb 4.9 oz)  02/05/16 72.6 kg (160 lb)    Physical Exam:  BP 118/63 (BP Location: Right Arm)   Pulse 60   Temp 98.3 F (36.8 C) (Oral)   Resp 18   Ht 5\' 8"  (1.727 m)   Wt 67.7 kg (149 lb 4.8 oz)   SpO2 98%   BMI 22.70 kg/m  Constitutional: He appears well-developedand well-nourished. NAD.  HENT: Normocephalic.  Right Ear: External earnormal.  Left Ear: External earnormal.  Eyes: Conjunctivaeand EOMare normal.  Neck: Cervical collar in place Cardiovascular: Normal rateand regular rhythm. no murmurs or rubs Respiratory: Effort normaland breath sounds normal. No respiratory distress. No wheezes or rhonchi GI: Soft. Bowel sounds are normal. He exhibits no distension. Non tender. Musculoskeletal: He exhibits no edemaor tenderness.  Neurological: He is alertand oriented.  Motor: RUE: 4+/5 deltoid, bicep, tricep, wrist and HI 4-4+/5.  LUE: 4/5 proximal to distal RLE 3+/5 HF, KE and 4/5 ADF/PF.  LUE: 4/5 HF, KE, 4+/5 prox to distal Decreased FMC RUE and RLE.  Skin: Skin is warmand dry.  Surgical site is dressed with drain in place Psychiatric: He has a normal mood and  affect. His behavior is normal   Assessment/Plan: 1. 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 Physiatrist is providing close team supervision and 24 hour management of active medical problems listed below. Physiatrist and rehab team continue to assess barriers to discharge/monitor patient progress toward functional and medical goals.  Function:  Bathing Bathing position   Position: Shower  Bathing parts Body parts bathed by patient: Right arm, Left arm, Chest, Abdomen, Front perineal area, Right upper leg, Left upper leg Body parts bathed by helper: Buttocks, Back, Right lower leg, Left lower leg  Bathing assist Assist Level: Touching or steadying assistance(Pt > 75%)      Upper Body Dressing/Undressing Upper body dressing   What is the patient wearing?: Pull over shirt/dress, Orthosis     Pull over shirt/dress - Perfomed by patient: Thread/unthread right sleeve, Thread/unthread left sleeve, Put head through opening, Pull shirt over trunk Pull over shirt/dress - Perfomed by helper: Put head through opening, Pull shirt over trunk     Orthosis activity level: Performed by helper  Upper body assist Assist Level: Supervision or verbal cues, Set up   Set up : To obtain clothing/put away, To apply TLSO, cervical collar  Lower Body Dressing/Undressing Lower body dressing   What is the patient wearing?: Pants, Underwear, Shoes Underwear -  Performed by patient: Thread/unthread right underwear leg, Pull underwear up/down Underwear - Performed by helper: Thread/unthread left underwear leg Pants- Performed by patient: Thread/unthread right pants leg, Thread/unthread left pants leg, Pull pants up/down Pants- Performed by helper: Fasten/unfasten pants         Shoes - Performed by patient: Don/doff right shoe, Don/doff left shoe Shoes - Performed by helper:  Fasten right, Fasten left          Lower body assist Assist for lower body dressing: Touching or steadying assistance (Pt > 75%)      Toileting Toileting   Toileting steps completed by patient: Performs perineal hygiene (Went from shower, no clothes) Toileting steps completed by helper: Adjust clothing prior to toileting, Performs perineal hygiene, Adjust clothing after toileting    Toileting assist Assist level: Touching or steadying assistance (Pt.75%)   Transfers Chair/bed transfer   Chair/bed transfer method: Stand pivot Chair/bed transfer assist level: Moderate assist (Pt 50 - 74%/lift or lower) Chair/bed transfer assistive device: Armrests     Locomotion Ambulation     Max distance: 90 Assist level: Moderate assist (Pt 50 - 74%)   Wheelchair   Type: Manual Max wheelchair distance: 150 Assist Level: Supervision or verbal cues  Cognition Comprehension Comprehension assist level: Follows complex conversation/direction with no assist  Expression Expression assist level: Expresses complex ideas: With no assist  Social Interaction Social Interaction assist level: Interacts appropriately with others - No medications needed.  Problem Solving Problem solving assist level: Solves complex 90% of the time/cues < 10% of the time  Memory Memory assist level: Recognizes or recalls 90% of the time/requires cueing < 10% of the time     Medical Problem List and Plan: 1. 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 Status post ACDF 01/16/2016 with postoperative epidural hematoma status post exploration and fusion removal of plate and bone graft 02/08/2016 followed by posterior cervical decompression 02/08/2016. Cervical collar at all times  Cont CIR. Team conference today  -provided prognostic education/positive reinforcement---needs regular reassurance 2.  DVT Prophylaxis/Anticoagulation: SCDs/mobility.   Vascular studies negative 3. Pain Management: Tylenol as  needed.                        -denies any pain at present even at op site 4. Diabetes mellitus. Hemoglobin A1c 6.4.   -glucophage stopped. Trial of amaryl  -weaning steroids  Check blood sugars before meals and at bedtime 5. Neuropsych: This patient is capable of making decisions on his own behalf. 6. Skin/Wound Care: Routine skin checks 7. Fluids/Electrolytes/Nutrition:   -push fluids--recheck Thursday or Friday 8. Dysphagia. Dysphagia #3  thin liquid diet                       -follow up per SLP 9. Hypertension: Improved  Lisinopril 2.5 mg daily increased to 5mg  on 8/26  Monitor with increased mobility 10. Hypothyroidism. Continue Synthroid. 11. Hyperlipidemia. Lipitor   LOS (Days) 4 A FACE TO FACE EVALUATION WAS PERFORMED  Wen Munford T 02/14/2016 9:11 AM

## 2016-02-14 NOTE — Progress Notes (Signed)
Occupational Therapy Session Note  Patient Details  Name: GINGER NOYOLA MRN: [id number] Date of Birth: [birth year]  Today's Date: 02/14/2016 OT Individual Time: 1420-1450 OT Individual Time Calculation (min): 30 min     Short Term Goals:Week 1:  OT Short Term Goal 1 (Week 1): Complete toileting with min assist for hygiene thoroughness OT Short Term Goal 2 (Week 1): Bathe upper body with supervision OT Short Term Goal 3 (Week 1): Demo ability to complete HEP to improve FMC of both hands OT Short Term Goal 4 (Week 1): Demo ability to use AE, prn, during lower body dressing to don pants with min assist  Skilled Therapeutic Interventions/Progress Updates:    Pt seen for OT session focusing on fine motor coordination. Pt sitting up in w/c upon arrival with family present, agreeable to tx session. Family present for tx session Completed 9 hole peg test, see results below.  Then completed in-hand manipulation task with R UE with increased time working with transition and translation in-hand.  Pt returned to room with family at end of session.   9 Hole Peg test:  R: 268 sec, 229 sec, 289 sec AVG: 262 sec L; 69 sec, 72 sec, 80 sec AVG: 55.25 sec   Therapy Documentation Precautions:  Precautions Precautions: Fall, Cervical Required Braces or Orthoses: Cervical Brace Cervical Brace: Hard collar Restrictions Weight Bearing Restrictions: No ADL: ADL ADL Comments: see Functional Assessment Tool  See Function Navigator for Current Functional Status.   Therapy/Group: Individual Therapy  [name], Noela Brothers C 02/14/2016, 2:57 PM

## 2016-02-15 ENCOUNTER — Ambulatory Visit (HOSPITAL_COMMUNITY): Payer: Medicare Other | Admitting: *Deleted

## 2016-02-15 ENCOUNTER — Inpatient Hospital Stay (HOSPITAL_COMMUNITY): Payer: Medicare Other | Admitting: Occupational Therapy

## 2016-02-15 ENCOUNTER — Inpatient Hospital Stay (HOSPITAL_COMMUNITY): Payer: Medicare Other | Admitting: Speech Pathology

## 2016-02-15 LAB — GLUCOSE, CAPILLARY
GLUCOSE-CAPILLARY: 102 mg/dL — AB (ref 65–99)
GLUCOSE-CAPILLARY: 291 mg/dL — AB (ref 65–99)
Glucose-Capillary: 175 mg/dL — ABNORMAL HIGH (ref 65–99)
Glucose-Capillary: 143 mg/dL — ABNORMAL HIGH (ref 65–99)

## 2016-02-15 MED ORDER — DEXAMETHASONE 2 MG PO TABS
2.0000 mg | ORAL_TABLET | Freq: Three times a day (TID) | ORAL | Status: DC
  Administered 2016-02-15 – 2016-02-17 (×6): 2 mg via ORAL
  Filled 2016-02-15 (×6): qty 1

## 2016-02-15 NOTE — Progress Notes (Signed)
Occupational Therapy Session Note  Patient Details  Name: Gary Craig MRN: [id number] Date of Birth: 1934/01/22  Today's Date: 02/15/2016 OT Individual Time: 0845-1000 and 1315-1410 OT Individual Time Calculation (min): 75 min and 55 min    Short Term Goals:Week 1:  OT Short Term Goal 1 (Week 1): Complete toileting with min assist for hygiene thoroughness OT Short Term Goal 2 (Week 1): Bathe upper body with supervision OT Short Term Goal 3 (Week 1): Demo ability to complete HEP to improve [place] of both hands OT Short Term Goal 4 (Week 1): Demo ability to use AE, prn, during lower body dressing to don pants with min assist  Skilled Therapeutic Interventions/Progress Updates:    Session One: Pt seen for OT ADL bathing/dressing session. Pt sitting up in w/c upon arrival, agreeable to tx session. He ambulated throughout session with CGA. Pt requires frequent cuing throughout session for safety awareness and to decrease fall risk as pt attmepts to complete higher level balance tasks in standing, I.e. Trying to slip off shoes while in standing. Educated regarding risk of fall and sitting to complete dynamic tasks when able.  He returned to supine and cervical brace pads changed. He returned to EOB to stress, standing with steadying assist to pull pants up. Pt then requested to go outside, as it has been several weeks since he has been outdoors. He self propelled w/c off unit with supervision, min A provided for self propelling outside on uneven surfaces. Pt completed theraband exercises outside including diagonal up, diagonal down, chest expansion, and rows. Completed x2 sets of 12 each with VCs provided for proper form and technique. Pt's preacher arrived as he was leaving unit and accompanied pt off unit. Pt requesting grounds pass for family to take him off unit. Obtained orders from PA and educated pt regarding sign out method. Pt returned to room at end of session, left sitting in w/c with all  needs in reach.  He required supine rest break and seated rest breaks throughout session due to fatigue and decreased activity tolerance.Educated regarding need to take tasks slower as pt has tendency to rush through tasks as well as energy conservation and prioritizing tasks.  Pt stating he has been transferring independently within room, educated again regarding use of call bell, fall risk, and need for assist.  Session Two: Pt seen for OT session focusing on core strengthening/ stability and fine motor coordination. Pt sitting up in w/c upon arrival, agreeable to tx session. He ambulated to therapy gym with CGA.  In therapy gym, pt completed core strengthening exercise, reclining back on  Wedged mat and using trunk to come into upright sitting, completed x3 sets of 5 with rest breaks provided throughout.  He then completed standing ball toss activity without UE support. Required min-mod A overall for dynamic standing balance. Pt with 1 anterior LOB episode requiring max- total A to return safely to therapy mat to prevent fall. He then completed fine motor task sitting EOM, screwing bolts and nuts using B UEs. Completed on each hand with increased time and trials. Pt left sitting EOM at end of session with hand off to PT. Pt's family present for end of session. Spoke with them regarding pt's progress, OT/PT goals, continuum of care, energy conservation, pt's current need for assist, and d/c planning.   Therapy Documentation Precautions:  Precautions Precautions: Fall, Cervical Required Braces or Orthoses: Cervical Brace Cervical Brace: Hard collar Restrictions Weight Bearing Restrictions: No Pain: Pain Assessment Pain Assessment:  0-10 Pain Type: No/ denies pain ADL: ADL ADL Comments: see Functional Assessment Tool  See Function Navigator for Current Functional Status.   Therapy/Group: Individual Therapy  [name], Sheriden Archibeque C 02/15/2016, 7:04 AM

## 2016-02-15 NOTE — Progress Notes (Signed)
Social Work Patient ID: Gary Craig, male   DOB: 06/28/1933, 82 y.o.   MRN: [id number]   CSW met with pt to update him on team conference discussion and targeted d/c date of 02-24-16.  Pt was pleased to learn of the date, but hopes he will see more progress prior to that time.  He's frustrated with his fine motor skills.  Pt's wife was not present, but stated she is due to visit today, so CSW will try to catch up with her while she's here.  He will update her, as well.  Pt was very complimentary of his therapy and staff on CIR.  Pt also talked about where he could go for outpt therapy in [place] and what would be close to his home so his wife would not have to drive too far.  CSW will work through this with pt and wife.  CSW will continue to follow and assist as needed.

## 2016-02-15 NOTE — Plan of Care (Signed)
Problem: RH SAFETY Goal: RH STG ADHERE TO SAFETY PRECAUTIONS W/ASSISTANCE/DEVICE STG Adhere to Safety Precautions With min Assistance/Device.   Outcome: Not Progressing Mod assist for reminders not to get up without assist

## 2016-02-15 NOTE — Progress Notes (Signed)
Physical Therapy Session Note  Patient Details  Name: Gary Craig MRN: [id number] Date of Birth: 1934-01-12  Today's Date: 02/15/2016 PT Individual Time: 1415-1510 PT Individual Time Calculation (min): 55 min    Short Term Goals: Week 1:  PT Short Term Goal 1 (Week 1): Pt will increase bed mobility to S.  PT Short Term Goal 2 (Week 1): Pt will increase transfers bed to chair with appropriate assistive device to min A.  PT Short Term Goal 3 (Week 1): Pt will ambulate about 150 feet with rolling walker and min A.  PT Short Term Goal 4 (Week 1): Pt will ascend/descend 4 stairs with 2 rails and min A.  PT Short Term Goal 5 (Week 1): Pt will propel w/c greater than 150 feet with mod I.   Skilled Therapeutic Interventions/Progress Updates:   Pt received seated on mat table with handoff from OT; denies pain initially however later c/o increased neck pain and RN alerted who administered medication. Stairs x14, 1x8, 1x12 on 6" steps with B handrails with min guard overall faded to close S; min verbal cues for foot placement and eccentric control. Four square step test with tape on floor as opposed to walking sticks to improve success/safety with poor foot clearance; first trial maxA for balance, second trial 32 seconds with min/modA overall for dynamic balance. Tall kneeling while engaged in BUE task playing connect 4; required max repetitive cues for glute activation and trunk extension for upright posture and core stability. Continued game in sitting for focus on BUE NMR due to pt fatigue in tall kneeling. Gait to return to room with RW and min guard faded to close S. Stand pivot transfer w/c >bed with minA. Remained supine in bed at end of session with NT present and all needs in reach.   Therapy Documentation Precautions:  Precautions Precautions: Fall, Cervical Required Braces or Orthoses: Cervical Brace Cervical Brace: Hard collar Restrictions Weight Bearing Restrictions: No Pain: Pain  Assessment Pain Assessment: 0-10 Pain Type: Acute pain Pain Location: Neck Pain Descriptors / Indicators: Aching Pain Frequency: Occasional Pain Onset: Gradual Patients Stated Pain Goal: 2 Pain Intervention(s): Medication (See eMAR) (percocet 1 po)   See Function Navigator for Current Functional Status.   Therapy/Group: Individual Therapy  [name] 02/15/2016, 3:11 PM

## 2016-02-15 NOTE — Progress Notes (Signed)
[place] PHYSICAL MEDICINE & REHABILITATION     PROGRESS NOTE  Subjective/Complaints:  Overall feeling well. Still anxious about recovery/prognosis.   ROS: Denies CP, SOB, N/V/D.  Objective: Vital Signs: Blood pressure 120/68, pulse 67, temperature 98.3 F (36.8 C), temperature source Oral, resp. rate 18, height 5\' 8"  (1.727 m), weight 67.7 kg (149 lb 4.8 oz), SpO2 100 %. No results found.  Recent Labs  02/13/16 0646  WBC 18.6*  HGB 12.5*  HCT 37.9*  PLT 245    Recent Labs  02/13/16 0646  NA 133*  K 4.7  CL 96*  GLUCOSE 210*  BUN 24*  CREATININE 0.76  CALCIUM 9.1   CBG (last 3)   Recent Labs  02/14/16 2024 02/15/16 0645 02/15/16 1138  GLUCAP 227* 175* 143*    Wt Readings from Last 3 Encounters:  02/10/16 67.7 kg (149 lb 4.8 oz)  02/09/16 70.9 kg (156 lb 4.9 oz)  02/05/16 72.6 kg (160 lb)    Physical Exam:  BP 120/68   Pulse 67   Temp 98.3 F (36.8 C) (Oral)   Resp 18   Ht 5\' 8"  (1.727 m)   Wt 67.7 kg (149 lb 4.8 oz)   SpO2 100%   BMI 22.70 kg/m  Constitutional: He appears well-developedand well-nourished. NAD.  HENT: Normocephalic.  Right Ear: External earnormal.  Left Ear: External earnormal.  Eyes: Conjunctivaeand EOMare normal.  Neck: Cervical collar in place Cardiovascular: Normal rateand regular rhythm. no murmurs or rubs Respiratory: Effort normaland breath sounds normal. No respiratory distress. No wheezes or rhonchi GI: Soft. Bowel sounds are normal. He exhibits no distension. Non tender. Musculoskeletal: He exhibits no edemaor tenderness.  Neurological: He is alertand oriented.  Motor: RUE: 4+/5 deltoid, bicep, tricep, wrist and HI 4-4+/5.  LUE: 4/5 proximal to distal RLE 4-/5 HF, KE and 4/5 ADF/PF.  LUE: 4/5 HF, KE, 4+/5 prox to distal Decreased FMC RUE and RLE.  Skin: Skin is warmand dry.  Surgical site is dressed with drain in place Psychiatric: He has a normal mood and affect. His behavior is  normal   Assessment/Plan: 1. 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 Physiatrist is providing close team supervision and 24 hour management of active medical problems listed below. Physiatrist and rehab team continue to assess barriers to discharge/monitor patient progress toward functional and medical goals.  Function:  Bathing Bathing position   Position: Shower  Bathing parts Body parts bathed by patient: Right arm, Left arm, Chest, Abdomen, Front perineal area, Right upper leg, Left upper leg Body parts bathed by helper: Buttocks, Right lower leg, Left lower leg, Back  Bathing assist Assist Level: Touching or steadying assistance(Pt > 75%)      Upper Body Dressing/Undressing Upper body dressing   What is the patient wearing?: Pull over shirt/dress, Orthosis     Pull over shirt/dress - Perfomed by patient: Thread/unthread right sleeve, Thread/unthread left sleeve, Put head through opening, Pull shirt over trunk Pull over shirt/dress - Perfomed by helper: Put head through opening, Pull shirt over trunk     Orthosis activity level: Performed by helper  Upper body assist Assist Level: Supervision or verbal cues, Set up   Set up : To obtain clothing/put away, To apply TLSO, cervical collar  Lower Body Dressing/Undressing Lower body dressing   What is the patient wearing?: Pants, Underwear, Shoes Underwear - Performed by patient: Thread/unthread right underwear leg, Pull underwear  up/down, Thread/unthread left underwear leg Underwear - Performed by helper: Thread/unthread left underwear leg Pants- Performed by patient: Thread/unthread right pants leg, Thread/unthread left pants leg, Pull pants up/down Pants- Performed by helper: Fasten/unfasten pants         Shoes - Performed by patient: Don/doff right shoe, Don/doff left shoe, Fasten left Shoes -  Performed by helper: Fasten right          Lower body assist Assist for lower body dressing: Touching or steadying assistance (Pt > 75%)      Toileting Toileting   Toileting steps completed by patient: Adjust clothing prior to toileting, Adjust clothing after toileting Toileting steps completed by helper: Performs perineal hygiene    Toileting assist Assist level: Touching or steadying assistance (Pt.75%)   Transfers Chair/bed transfer   Chair/bed transfer method: Stand pivot Chair/bed transfer assist level: Moderate assist (Pt 50 - 74%/lift or lower) Chair/bed transfer assistive device: Armrests     Locomotion Ambulation     Max distance: 125 Assist level: Touching or steadying assistance (Pt > 75%)   Wheelchair   Type: Manual Max wheelchair distance: 150 Assist Level: Supervision or verbal cues  Cognition Comprehension Comprehension assist level: Follows complex conversation/direction with no assist  Expression Expression assist level: Expresses complex ideas: With no assist  Social Interaction Social Interaction assist level: Interacts appropriately with others - No medications needed.  Problem Solving Problem solving assist level: Solves complex 90% of the time/cues < 10% of the time  Memory Memory assist level: Recognizes or recalls 90% of the time/requires cueing < 10% of the time     Medical Problem List and Plan: 1. 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 Status post ACDF 01/16/2016 with postoperative epidural hematoma status post exploration and fusion removal of plate and bone graft 02/08/2016 followed by posterior cervical decompression 02/08/2016. Cervical collar at all times  Cont CIR. Team conference today  -provided prognostic education/positive reinforcement-once again 2.  DVT Prophylaxis/Anticoagulation: SCDs/mobility.   Vascular studies negative 3. Pain Management: Tylenol as needed.                        -denies any pain at  present even at op site 4. Diabetes mellitus. Hemoglobin A1c 6.4.   -glucophage stopped. Trial of amaryl  -weaning steroids  -sugars improving 5. Neuropsych: This patient is capable of making decisions on his own behalf. 6. Skin/Wound Care: Routine skin checks. Dc sutures neck 7. Fluids/Electrolytes/Nutrition:   -push fluids--recheck Thursday or Friday 8. Dysphagia. Dysphagia #3  thin liquid diet                       -follow up per SLP 9. Hypertension: Improved  Lisinopril 2.5 mg daily increased to 5mg  on 8/26  Monitor with increased mobility 10. Hypothyroidism. Continue Synthroid. 11. Hyperlipidemia. Lipitor   LOS (Days) 5 A FACE TO FACE EVALUATION WAS PERFORMED  Brigg Cape T 02/15/2016 12:55 PM

## 2016-02-15 NOTE — Progress Notes (Signed)
Social Work Patient ID: Gary Craig, male   DOB: 08/26/1933, 82 y.o.   MRN: [id number]   Lynnda Child, LCSW Social Worker Signed   Patient Care Conference Date of Service: 02/14/2016  2:55 PM      Hide copied text Hover for attribution information Inpatient RehabilitationTeam Conference and Plan of Care Update Date: 02/14/2016   Time: 2:00 PM      Patient Name: Gary Craig      Medical Record Number: [id number]  Date of Birth: 1934/06/15 Sex: Male         Room/Bed: 4W07C/4W07C-01 Payor Info: Payor: MEDICARE / Plan: MEDICARE PART A AND B / Product Type: *No Product type* /     Admitting Diagnosis: Spinal Sgtenosis  Admit Date/Time:  02/10/2016  3:40 PM Admission Comments: No comment available    Primary Diagnosis:  Spondylosis, cervical, with myelopathy Principal Problem: Spondylosis, cervical, with myelopathy       Patient Active Problem List    Diagnosis Date Noted  . Essential hypertension 02/10/2016  . Spinal stenosis, cervical region 02/08/2016  . Surgery, other elective    . Spondylosis, cervical, with myelopathy    . Diabetes mellitus type 2 in nonobese (HCC)    . Coronary artery disease involving native coronary artery of native heart without angina pectoris    . History of CVA (cerebrovascular accident)    . Post-operative pain    . Dysphagia    . Leukocytosis    . Tachypnea    . Quadriparesis ([place]) 02/05/2016  . Myelopathy, spondylogenic, cervical 01/16/2016  . CVA (cerebral infarction) 05/31/2015      Expected Discharge Date: Expected Discharge Date: 02/24/16   Team Members Present: Physician leading conference: Dr. Alger Simons Social Worker Present: Alfonse Alpers, LCSW Nurse Present: Heather Roberts, RN PT Present: Kem Parkinson, PT OT Present: Napoleon Form, OT SLP Present: Weston Anna, SLP PPS Coordinator present : Daiva Nakayama, RN, CRRN       Current Status/Progress Goal Weekly Team Focus  Medical   cervical myelopathy. sensory  deficits,strength improving, continent bowel and bladder  improve safety, slowing down  iimprove patience, awareness, ongoing ed, manage diet/swallow   Bowel/Bladder   continent of bowel/bladder. LBM 02/13/16  patient to remain continent of bowel/bladder while in [facility] with min assist  Monitor bowel/bladder functions q shifty and as needed   Swallow/Nutrition/ Hydration   Dys. 2 textures with thin liquids, Intermittent supervision   Mod I   Tolerance of current diet, increased use of strategies, possible trials of upgraded textures    ADL's   Mod A LB dressing; Set-up UB bathing/dressing; Mod A functional transfers; mod A Toileting  Supervision- mod I overall  ADL re-training, fine motor coordination, core strengthening/ stability, activity tolerance   Mobility   S bed mobility, modA sit <>stand and stand pivot transfers, min/modA gait with RW  modI bed mobility and transfers, S gait in home and community, stairs  dynamic standing balance, coordination, gait training, NMR   Communication             Safety/Cognition/ Behavioral Observations           Pain   No complain of pain  <3  monitor and treat pain q shift and as needed   Skin   Surgical incision on both front and back of the neck  Patient skin to be free of new skin breakdown/infection  Monitor skin q shift and as needed     Rehab Goals Patient on  target to meet rehab goals: Yes Rehab Goals Revised: none - pt's first conference *See Care Plan and progress notes for long and short-term goals.   Barriers to Discharge: impulsivity, proprioceptive deficits   Possible Resolutions to Barriers:  ongoing education and training forpatient and family   Discharge Planning/Teaching Needs:  Pt plans to return to his home at d/c with his wife to provide supervision as needed.  Wife can attend family education closer to d/c.  Wife does not drive to hospital, so will need to coordinate with her when she can get a ride here.   Team Discussion:  Pt is  progressing nicely.  He has some sensory deficits, but Dr. Naaman Plummer feels he should do well in rehab.  Pt has no pain, bowel and bladder working, and appetite is better with addition of megace.  Pt still with swallowing issues and SLP feels pt/family will need constant re-education about diet.  Recommending medications be taken whole in purees.  Pt with some strength deficits and ataxia per PT.  OT reports pt is min to mod overall with cueing needed for safety.  Revisions to Treatment Plan:  none    Continued Need for Acute Rehabilitation Level of Care: The patient requires daily medical management by a physician with specialized training in physical medicine and rehabilitation for the following conditions: Daily direction of a multidisciplinary physical rehabilitation program to ensure safe treatment while eliciting the highest outcome that is of practical value to the patient.: Yes Daily medical management of patient stability for increased activity during participation in an intensive rehabilitation regime.: Yes Daily analysis of laboratory values and/or radiology reports with any subsequent need for medication adjustment of medical intervention for : Neurological problems;Post surgical problems;Other   Shenouda Genova, Silvestre Mesi 02/14/2016, 2:55 PM

## 2016-02-15 NOTE — Progress Notes (Signed)
Speech Language Pathology Daily Session Note  Patient Details  Name: Gary Craig MRN: [id number] Date of Birth: 1933/11/28  Today's Date: 02/15/2016 SLP Individual Time: 0730-0827 SLP Individual Time Calculation (min): 57 min   Short Term Goals: Week 1: SLP Short Term Goal 1 (Week 1): Pt will consume dys 2 textures and thin liquids with mod I use of swallowing precautions and minimal overt s/s of aspiration.  SLP Short Term Goal 2 (Week 1): Pt will consume therapeutic trials of Dys. 3 textures with mod I use of swallowing precautions and minimal overt s/s of aspiration over 3 consecutive session prior to advancement.    Skilled Therapeutic Interventions: Skilled treatment session focused on dysphagia goals. SLP facilitated session by providing skilled observation with breakfast meal of Dys. 2 textures with thin liquids via straw with decreased overt s/s of aspiration and decreased audible swallows with intermittent supervision verbal cues needed for use of swallowing compensatory strategies. Recommend patient continue current diet. Patient also participated in a task that focused on choosing correct food and liquid items based on his current diet and ordering his lunch of appropriate items via phone. Patient completed tasks with Mod I. Patient left upright in wheelchair with all needs within reach. Continue with current plan of care.   Function:  Eating Eating   Modified Consistency Diet: Yes Eating Assist Level: More than reasonable amount of time;Supervision or verbal cues           Cognition Comprehension Comprehension assist level: Follows complex conversation/direction with no assist  Expression   Expression assist level: Expresses complex ideas: With no assist  Social Interaction Social Interaction assist level: Interacts appropriately with others - No medications needed.  Problem Solving Problem solving assist level: Solves complex 90% of the time/cues < 10% of the time   Memory Memory assist level: Recognizes or recalls 90% of the time/requires cueing < 10% of the time    Pain No/Denies Pain   Therapy/Group: Individual Therapy  Gary Craig 02/15/2016, 9:13 AM

## 2016-02-16 ENCOUNTER — Inpatient Hospital Stay (HOSPITAL_COMMUNITY): Payer: Medicare Other | Admitting: Speech Pathology

## 2016-02-16 ENCOUNTER — Inpatient Hospital Stay (HOSPITAL_COMMUNITY): Payer: Medicare Other | Admitting: Physical Therapy

## 2016-02-16 ENCOUNTER — Inpatient Hospital Stay (HOSPITAL_COMMUNITY): Payer: Medicare Other | Admitting: Occupational Therapy

## 2016-02-16 LAB — GLUCOSE, CAPILLARY
GLUCOSE-CAPILLARY: 135 mg/dL — AB (ref 65–99)
GLUCOSE-CAPILLARY: 161 mg/dL — AB (ref 65–99)
GLUCOSE-CAPILLARY: 226 mg/dL — AB (ref 65–99)
GLUCOSE-CAPILLARY: 277 mg/dL — AB (ref 65–99)

## 2016-02-16 NOTE — Progress Notes (Signed)
Physical Therapy Session Note  Patient Details  Name: Gary Craig MRN: [id number] Date of Birth: [birth year]  Today's Date: 02/16/2016 PT Individual Time: 0900-1000 and 1400-1500 PT Individual Time Calculation (min): 60 min and 60 min (total 120 min)    Short Term Goals: Week 1:  PT Short Term Goal 1 (Week 1): Pt will increase bed mobility to S.  PT Short Term Goal 2 (Week 1): Pt will increase transfers bed to chair with appropriate assistive device to min A.  PT Short Term Goal 3 (Week 1): Pt will ambulate about 150 feet with rolling walker and min A.  PT Short Term Goal 4 (Week 1): Pt will ascend/descend 4 stairs with 2 rails and min A.  PT Short Term Goal 5 (Week 1): Pt will propel w/c greater than 150 feet with mod I.   Skilled Therapeutic Interventions/Progress Updates:   Tx 1: Pt received seated in w/c at room door attempting to retrieve and open RW. Denies pain and agreeable to treatment. Gait to/from gym with RW and S overall, min guard when turning corners d/t poor management or RW and scissoring LEs. In gym, gait with no AD, weighted belt, and ankle weights each 2x20' to assess benefits for ataxia; required mod/max overall, with most improvement when using 3lb ankle weights. Gait x80' with hall rail and BLE ankle weights with mod cueing for R glute activation and core stability in stance. Lateral step ups 1x10 BLE on 8" step with parallel bars for BUE support, performed for glute med strengthening. Modified plantigrade and quadruped with alternating UE/LE lifts for core stability and coordination. Returned to room as above, remained seated in recliner at end of session. Educated pt on recommendation he wait for staff before getting up d/t previous reports of pt getting up unassisted; pt agreeable.   Tx 2: Pt received seated in w/c, denies pain and agreeable to treatment. Gait to/from gym with RW and close S. Note LLE rolling with pt landing on lateral border of foot; not corrected with  cueing. Kinetron in sitting/standing, stepping and static standing for BLE coordination, core stability, postural retraining. Pt initially requires maxA d/t buckling on stance LE, improved to min guard with repetition. Progressive balance exercises in parallel bars including normal BOS, narrow BOS, semi-tandem, tandem stand, airex pad; all with eyes open/closed with light UE support. Tandem walking, side stepping, and side stepping with theraband for hip abduction strengthening. Returned to room with gait as above; remained seated in w/c at end of session, all needs in reach.   Therapy Documentation Precautions:  Precautions Precautions: Fall, Cervical Required Braces or Orthoses: Cervical Brace Cervical Brace: Hard collar Restrictions Weight Bearing Restrictions: No  Pain: Pain Assessment Pain Score: 3  Pain Type: Acute pain Pain Location: Neck Pain Orientation: Posterior Pain Descriptors / Indicators: Aching Patients Stated Pain Goal: 1 Pain Intervention(s): Medication (See eMAR) Multiple Pain Sites: No   See Function Navigator for Current Functional Status.   Therapy/Group: Individual Therapy  [name] 02/16/2016, 10:44 AM

## 2016-02-16 NOTE — Progress Notes (Signed)
Speech Language Pathology Daily Session Note  Patient Details  Name: Gary Craig MRN: [id number] Date of Birth: 13-Dec-1933  Today's Date: 02/16/2016 SLP Individual Time: 0730-0830 SLP Individual Time Calculation (min): 60 min   Short Term Goals: Week 1: SLP Short Term Goal 1 (Week 1): Pt will consume dys 2 textures and thin liquids with mod I use of swallowing precautions and minimal overt s/s of aspiration.  SLP Short Term Goal 2 (Week 1): Pt will consume therapeutic trials of Dys. 3 textures with mod I use of swallowing precautions and minimal overt s/s of aspiration over 3 consecutive session prior to advancement.    Skilled Therapeutic Interventions:Skilled therapy intervention focused on dysphagia goals. Patient expressed discouragement about personal rate of progress in recovery. Emotional support provided and patient was receptive. Patient consumed 50% of dysphagia 2 textures and thin liquids during session, utilizing compensatory strategies with Min A verbal cues at the start of session and Mod I by end of session. Patient with consistent overt s/s of aspiration throughout meal, therefore recommend patient continue current diet. Due to C-spine discomfort, patient utilized call bell for pain medication and pills were consumed whole in applesauce without s/s of aspiration. Patient left upright in wheelchair with all needs within reach. Continue current plan of care.     Function:  Eating Eating   Modified Consistency Diet: Yes Eating Assist Level: Supervision or verbal cues;Set up assist for   Eating Set Up Assist For: Opening containers       Cognition Comprehension Comprehension assist level: Follows complex conversation/direction with extra time/assistive device  Expression   Expression assist level: Expresses complex ideas: With extra time/assistive device  Social Interaction Social Interaction assist level: Interacts appropriately with others - No medications needed.   Problem Solving Problem solving assist level: Solves complex problems: With extra time  Memory Memory assist level: Complete Independence: No helper    Pain Pain Assessment Pain Score: 3  Pain Type: Acute pain Pain Location: Neck Pain Orientation: Posterior Pain Descriptors / Indicators: Aching Patients Stated Pain Goal: 1 Pain Intervention(s): Medication (See eMAR) Multiple Pain Sites: No  Therapy/Group: Individual Therapy  Thornton Papas 02/16/2016, 9:29 AM

## 2016-02-16 NOTE — Progress Notes (Signed)
Sutures to anterior neck incision removed, no drainage, incision line approximated, edema noted. Gary Craig A

## 2016-02-16 NOTE — Progress Notes (Signed)
PHYSICAL MEDICINE & REHABILITATION     PROGRESS NOTE  Subjective/Complaints:  Up with SLP eating breakfast. Still quite anxious about recovery/course---"how am I doing?"   ROS: Denies CP, SOB, N/V/D.  Objective: Vital Signs: Blood pressure 115/61, pulse 63, temperature 97.3 F (36.3 C), temperature source Oral, resp. rate 18, height 5\' 8"  (1.727 m), weight 67.5 kg (148 lb 12.8 oz), SpO2 99 %. No results found. No results for input(s): WBC, HGB, HCT, PLT in the last 72 hours. No results for input(s): NA, K, CL, GLUCOSE, BUN, CREATININE, CALCIUM in the last 72 hours.  Invalid input(s): CO CBG (last 3)   Recent Labs  02/15/16 1715 02/15/16 2054 02/16/16 0647  GLUCAP 102* 291* 135*    Wt Readings from Last 3 Encounters:  02/15/16 67.5 kg (148 lb 12.8 oz)  02/09/16 70.9 kg (156 lb 4.9 oz)  02/05/16 72.6 kg (160 lb)    Physical Exam:  BP 115/61 (BP Location: Right Arm)   Pulse 63   Temp 97.3 F (36.3 C) (Oral)   Resp 18   Ht 5\' 8"  (1.727 m)   Wt 67.5 kg (148 lb 12.8 oz)   SpO2 99%   BMI 22.62 kg/m  Constitutional: He appears well-developedand well-nourished. NAD.  HENT: Normocephalic.  Right Ear: External earnormal.  Left Ear: External earnormal.  Eyes: Conjunctivaeand EOMare normal.  Neck: Cervical collar in place Cardiovascular: Normal rateand regular rhythm. no murmurs or rubs Respiratory: Effort normaland breath sounds normal. No respiratory distress. No wheezes or rhonchi GI: Soft. Bowel sounds are normal. He exhibits no distension. Non tender. Musculoskeletal: He exhibits no edemaor tenderness.  Neurological: He is alertand oriented.  Motor: RUE: 4+/5 deltoid, bicep, tricep, wrist and HI 4-4+/5.  LUE: 4/5 proximal to distal RLE 4-/5 HF, KE and 4/5 ADF/PF.  LUE: 4/5 HF, KE, 4+/5 prox to distal Impaired FMC RUE and RLE. Neuro exam stable to improved  Skin: Skin is warmand dry.  Surgical site clean/sutures out Psychiatric: He has a  normal mood and affect. His behavior is normal   Assessment/Plan: 1. 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 Physiatrist is providing close team supervision and 24 hour management of active medical problems listed below. Physiatrist and rehab team continue to assess barriers to discharge/monitor patient progress toward functional and medical goals.  Function:  Bathing Bathing position   Position: Shower  Bathing parts Body parts bathed by patient: Right arm, Left arm, Chest, Abdomen, Front perineal area, Right upper leg, Left upper leg Body parts bathed by helper: Buttocks, Right lower leg, Left lower leg, Back  Bathing assist Assist Level: Touching or steadying assistance(Pt > 75%)      Upper Body Dressing/Undressing Upper body dressing   What is the patient wearing?: Pull over shirt/dress, Orthosis     Pull over shirt/dress - Perfomed by patient: Thread/unthread right sleeve, Thread/unthread left sleeve, Put head through opening, Pull shirt over trunk Pull over shirt/dress - Perfomed by helper: Put head through opening, Pull shirt over trunk     Orthosis activity level: Performed by helper  Upper body assist Assist Level: Supervision or verbal cues, Set up   Set up : To obtain clothing/put away, To apply TLSO, cervical collar  Lower Body Dressing/Undressing Lower body dressing   What is the patient wearing?: Pants, Underwear, Shoes Underwear - Performed by patient: Thread/unthread right underwear leg, Pull underwear up/down, Thread/unthread left underwear leg  Underwear - Performed by helper: Thread/unthread left underwear leg Pants- Performed by patient: Thread/unthread right pants leg, Thread/unthread left pants leg, Pull pants up/down Pants- Performed by helper: Fasten/unfasten pants         Shoes - Performed by patient: Don/doff right shoe,  Don/doff left shoe, Fasten left Shoes - Performed by helper: Fasten right          Lower body assist Assist for lower body dressing: Touching or steadying assistance (Pt > 75%)      Toileting Toileting   Toileting steps completed by patient: Adjust clothing prior to toileting, Adjust clothing after toileting Toileting steps completed by helper: Performs perineal hygiene    Toileting assist Assist level: Touching or steadying assistance (Pt.75%)   Transfers Chair/bed transfer   Chair/bed transfer method: Stand pivot Chair/bed transfer assist level: Supervision or verbal cues Chair/bed transfer assistive device: Armrests, Medical sales representative     Max distance: 150 Assist level: Supervision or verbal cues   Wheelchair   Type: Manual Max wheelchair distance: 150 Assist Level: Supervision or verbal cues  Cognition Comprehension Comprehension assist level: Follows complex conversation/direction with extra time/assistive device  Expression Expression assist level: Expresses complex ideas: With extra time/assistive device  Social Interaction Social Interaction assist level: Interacts appropriately with others - No medications needed.  Problem Solving Problem solving assist level: Solves complex problems: With extra time  Memory Memory assist level: Complete Independence: No helper     Medical Problem List and Plan: 1. 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 Status post ACDF 01/16/2016 with postoperative epidural hematoma status post exploration and fusion removal of plate and bone graft 02/08/2016 followed by posterior cervical decompression 02/08/2016. Cervical collar at all times  Cont CIR.   -requires ongoing reassurance regarding recovery 2.  DVT Prophylaxis/Anticoagulation: SCDs/mobility.   Vascular studies negative 3. Pain Management: Tylenol as needed.                        -denies any pain at present even at op site 4. Diabetes  mellitus. Hemoglobin A1c 6.4.   -now on amaryl 2mg  daily---sugars better  -won't adjust further as we're weaning steroids of   5. Neuropsych: This patient is capable of making decisions on his own behalf.  -suspect some of behavior is related to steroids although family states he can be quite determined! 6. Skin/Wound Care: Routine skin checks. Dc sutures neck 7. Fluids/Electrolytes/Nutrition:   -push fluids--recheck Thursday or Friday 8. Dysphagia. Dysphagia #2  thin liquid diet                       -follow up per SLP 9. Hypertension: Improved  Lisinopril 2.5 mg daily increased to 5mg  on 8/26  Monitor with increased mobility 10. Hypothyroidism. Continue Synthroid. 11. Hyperlipidemia. Lipitor   LOS (Days) 6 A FACE TO FACE EVALUATION WAS PERFORMED  Nashanti Duquette T 02/16/2016 11:07 AM

## 2016-02-16 NOTE — Progress Notes (Signed)
Occupational Therapy Session Note  Patient Details  Name: Gary Craig MRN: [id number] Date of Birth: May 18, 1934  Today's Date: 02/16/2016 OT Individual Time: 1100-1200 OT Individual Time Calculation (min): 60 min     Short Term Goals:Week 1:  OT Short Term Goal 1 (Week 1): Complete toileting with min assist for hygiene thoroughness OT Short Term Goal 2 (Week 1): Bathe upper body with supervision OT Short Term Goal 3 (Week 1): Demo ability to complete HEP to improve FMC of both hands OT Short Term Goal 4 (Week 1): Demo ability to use AE, prn, during lower body dressing to don pants with min assist  Skilled Therapeutic Interventions/Progress Updates:    Pt seen for OT tx session focusing on ADL re-training and fine motor coordination. Pt sitting up in w/c upon arrival, voicing need for toileting task. Pt ambulated wit RW and Min A. Pt with 1 LOB episode, required mod A to regain balance. Pt cont to be unaware of deficits and represents high fall risk.  He self propelled w/c to therapy gym. He completed UE strengthening task using #2 dowel rod, x12 reps shoulder press, chest press, and bicep curls. He required max cuing to slow down and proper form and technique.  9 hole peg test completed, see results below.  He then completed buttoning and zipper activity, able to manage using B UEs with increased time. Pt returned to room in w/c, left with all needs in reach and family present.  Cont to require education and cuing to slow down and incorporate rest breaks throughout.   9 hole peg test:  R: 1 min 49 sec, 2 min37 sec, 2 min 8 sec = AVG: 2 min 11 sec (131.6 sec) L: 1 min 22 sec, 1 min 3 sec, 1 min 14 sec, = AVG 1 min 13 sec (73.55.)  Therapy Documentation Precautions:  Precautions Precautions: Fall, Cervical Required Braces or Orthoses: Cervical Brace Cervical Brace: Hard collar Restrictions Weight Bearing Restrictions: No Pain:   No/ denies pain ADL: ADL ADL Comments: see  Functional Assessment Tool  See Function Navigator for Current Functional Status.   Therapy/Group: Individual Therapy  [name], Michelene Keniston C 02/16/2016, 7:01 AM

## 2016-02-16 NOTE — Progress Notes (Signed)
Social Work Patient ID: [name], male   DOB: [birth year], 82 y.o.   MRN: [id number]   CSW met with pt, pt's wife, and pt's dtr Juliann Pulse) to update them on team conference discussion and to answer questions about d/c planning.  Pt prefers to go to outpt therapies instead of home health.  CSW researched [facility], as he'd like to go there for therapies.  They do not do outpt.  Their second choice was [facility] and they do outpt therapies.  CSW will set pt up at [facility].  Also discussed DME and assured them that CSW will order this closer to d/c.  Dtr asked about a rollator and CSW talked with pt's PT, Lizzy, who does not recommend a rollator at this time.  CSW also called Dr. Windy Carina office at pt's request to see what he wanted to do about pt's f/u appt which was scheduled for 02-21-16.  Office moved appointment to 02-28-16.  Pt's family also requested handicap placard application and CSW obtained this and gave it to family.  Pt/family had questions about pt starting Plavix back or not and CSW asked Marlowe Shores, PA what pt should do.  He referred pt to Dr. [name] and to let him make the decision.  Family will come in for family education next week closer to d/c.  Pt/family did not have any further questions.  CSW will continue to follow and assist as needed.

## 2016-02-17 ENCOUNTER — Inpatient Hospital Stay (HOSPITAL_COMMUNITY): Payer: Medicare Other | Admitting: Speech Pathology

## 2016-02-17 ENCOUNTER — Inpatient Hospital Stay (HOSPITAL_COMMUNITY): Payer: Medicare Other | Admitting: Occupational Therapy

## 2016-02-17 ENCOUNTER — Inpatient Hospital Stay (HOSPITAL_COMMUNITY): Payer: Medicare Other | Admitting: Physical Therapy

## 2016-02-17 LAB — URINALYSIS, ROUTINE W REFLEX MICROSCOPIC
BILIRUBIN URINE: NEGATIVE
GLUCOSE, UA: 500 mg/dL — AB
Hgb urine dipstick: NEGATIVE
Ketones, ur: NEGATIVE mg/dL
Leukocytes, UA: NEGATIVE
NITRITE: NEGATIVE
PH: 5.5 (ref 5.0–8.0)
Protein, ur: NEGATIVE mg/dL
SPECIFIC GRAVITY, URINE: 1.033 — AB (ref 1.005–1.030)

## 2016-02-17 LAB — GLUCOSE, CAPILLARY
GLUCOSE-CAPILLARY: 140 mg/dL — AB (ref 65–99)
GLUCOSE-CAPILLARY: 245 mg/dL — AB (ref 65–99)
Glucose-Capillary: 205 mg/dL — ABNORMAL HIGH (ref 65–99)
Glucose-Capillary: 192 mg/dL — ABNORMAL HIGH (ref 65–99)

## 2016-02-17 MED ORDER — DEXAMETHASONE 2 MG PO TABS
2.0000 mg | ORAL_TABLET | Freq: Two times a day (BID) | ORAL | Status: DC
  Administered 2016-02-17 – 2016-02-19 (×4): 2 mg via ORAL
  Filled 2016-02-17 (×4): qty 1

## 2016-02-17 NOTE — Progress Notes (Signed)
Therapy reporting increase in impulsiveness and safety awareness with family questioning SE due to narcotics. Only using once a day but will d/c oxycodone. WBC on upward trend--will check UA/UCS to rule out UTI. Monitor for now

## 2016-02-17 NOTE — Progress Notes (Signed)
Physical Therapy Weekly Progress Note  Patient Details  Name: Gary Craig MRN: [id number] Date of Birth: 1934-01-12  Beginning of progress report period: February 11, 2016 End of progress report period: February 17, 2016  Today's Date: 02/17/2016 PT Individual Time: 0830-0930 PT Individual Time Calculation (min): 60 min    Patient has met 5 of 5 short term goals.  Pt currently requires minA to min guard for all mobility tasks due to truncal and lower extremity ataxia. Pt with good awareness of deficits however poor safety awareness and carryover of how those deficits reduce ability to function safely in a home or community environment. Requires reminders not to get up alone as he continues to require assistance to prevent fall. Family has been present for several PT sessions and is actively participating in rehab process including ongoing education.   Patient continues to demonstrate the following deficits: impaired activity tolerance, balance, postural control, ability to compensate for deficits, functional use of  right upper extremity, right lower extremity, left upper extremity and left lower extremity, attention, awareness, coordination and knowledge of precautions and therefore will continue to benefit from skilled PT intervention to enhance overall performance with bed mobility, transfers, gait, stairs, home and community access.  Patient progressing toward long term goals..  Plan of care revisions: Goals downgraded d/t supervision overall due to impulsivity, decreased safety awareness, ataxia and slow progress. Wife will be able to provide S at d/c. Marland Kitchen  PT Short Term Goals Week 1:  PT Short Term Goal 1 (Week 1): Pt will increase bed mobility to S.  PT Short Term Goal 1 - Progress (Week 1): Met PT Short Term Goal 2 (Week 1): Pt will increase transfers bed to chair with appropriate assistive device to min A.  PT Short Term Goal 2 - Progress (Week 1): Met PT Short Term Goal 3 (Week 1): Pt  will ambulate about 150 feet with rolling walker and min A.  PT Short Term Goal 3 - Progress (Week 1): Met PT Short Term Goal 4 (Week 1): Pt will ascend/descend 4 stairs with 2 rails and min A.  PT Short Term Goal 4 - Progress (Week 1): Met PT Short Term Goal 5 (Week 1): Pt will propel w/c greater than 150 feet with mod I.  PT Short Term Goal 5 - Progress (Week 1): Met Week 2:  PT Short Term Goal 1 (Week 2): =LTG due to estimated LOS   Skilled Therapeutic Interventions/Progress Updates:   Pt received seated in w/c, denies pain and agreeable to treatment. Gait to gym with RW and close S; occasional R foot drag and knee recurvatum. Standing balance alternating toe taps with theraband around shoulders for BUE lattisimus activation to improve proximal stability. Gait 5x60' with min/modA, no AD, and theraband for continued core activation and stability. Pt with improved balance, coordination after repetition with theraband and reduced cues needed to engage proximal UE stabilizers. Continues to requires cues to reduce impulsivity especially when turning to sit in a chair. Nustep x8 min with BLE level 7 for strengthening, NMR, reciprocal stepping, aerobic endurance. Returned to room with gait x150' RW and S. Remained seated in w/c at end of session, all needs in reach.    Therapy Documentation Precautions:  Precautions Precautions: Fall, Cervical Required Braces or Orthoses: Cervical Brace Cervical Brace: Hard collar Restrictions Weight Bearing Restrictions: No   See Function Navigator for Current Functional Status.  Therapy/Group: Individual Therapy  [name] 02/17/2016, 9:24 AM

## 2016-02-17 NOTE — Progress Notes (Signed)
Occupational Therapy Weekly Progress Note  Patient Details  Name: Gary CORP MRN: [id number] Date of Birth: 07/16/1933  Beginning of progress report period: February 11, 2016 End of progress report period: February 17, 2016  Today's Date: 02/17/2016 OT Individual Time: 1100-1200 and 1330-1430 OT Individual Time Calculation (min): 60 min and 60 min   Patient has met 3 of 3 short term goals.  Pt making excellent functional gains. He continues to require VCs and overall min A for stability and for safety as pt high fall risk. Pt has tendency to rush through tasks resulting in need for increased assist due to poor safety awareness and awareness of deficits. He cont to make gains with fine motor coordination and UE strengthening, and remains motivated to return to PLOF.   Patient continues to demonstrate the following deficits: quadriparesis at level C6 and therefore will continue to benefit from skilled OT intervention to enhance overall performance with BADL, iADL and Reduce care partner burden.  Patient progressing toward long term goals..  Continue plan of care.  OT Short Term Goals Week 1:  OT Short Term Goal 1 (Week 1): Complete toileting with min assist for hygiene thoroughness OT Short Term Goal 1 - Progress (Week 1): Met OT Short Term Goal 2 (Week 1): Bathe upper body with supervision OT Short Term Goal 2 - Progress (Week 1): Met OT Short Term Goal 3 (Week 1): Demo ability to complete HEP to improve [facility] of both hands OT Short Term Goal 3 - Progress (Week 1): Met OT Short Term Goal 4 (Week 1): Demo ability to use AE, prn, during lower body dressing to don pants with min assist OT Short Term Goal 4 - Progress (Week 1): Updated due to goal met (Pt does not require use of AE for LB dressing tasks) Week 2:  OT Short Term Goal 1 (Week 2): STG=LTG due to LOS   Skilled Therapeutic Interventions/Progress Updates:    Session One: Pt seen for OT ADL bathing/dressing session. Pt sitting up  in recliner upon arrival, voicing desire for toileting task and shower. He ambulated throughout room with RW and close supervision- CGA.  Pt bathed seated on tub transfer bench, supervision and VCs required for safety awareness. Pt able to stand to complete buttock hygiene, an improvement from previous sessions. He returned to bed for cervical brace pads to be changed and supine rest break per pt's request. While pt resting, spoke with pt's daughter and son-in-law regarding pt's current level of function and more recent change in behavior as pt more impulsive with decreased safety awareness. Family in agreement and have also noted changes in behavior. Family thinks it maybe medication related. After session, spoke with Algis Liming, PA regarding family's concern and change in behavior and thoughts that it may be medication related.  Pt completed grooming tasks standing at sink with steadying assist. He then completed handwriting task using marker to write on mirror with emphasis on UE strengthening/  Proximal stabilization. Pt left sitting in w/c at end of session, all needs in reach and family members present. Pt again admitted to transfering independently without staff present. Cont to educate regarding fall risk and that he is NEVER allowed to transfer independently no matter the distance travels. Pt voiced understanding.   Session Two: Pt seen for OT session focusing on functional transfers, UE strengthening, and functional standing balance. Pt sitting up in w/c upon arrival, family present and agreeable to tx session.  In ADL apartment, pt completed simulated  tub/shower transfer utilizing tub transfer bench. Following demonstration, pt completed with supervision and VCs. Pt's family present and educated regarding pt's need for supervision with transfers and bathing tasks as well as other DME for bathroom modifications including grab bars and hand help shower head. He then completed UE reaching task  standing at kitchen counter, required to reach ~90 degrees to remove and replace items in cabinet with emphasis on UE strengthening and stability. Pt required B UE use to manipulate heavier items and steadying assist for balance. He completed floor transfer with close supervision and demonstration. Pt and family educated regarding need to call 911 if any injury is suspected and not to attempt to stand if injury suspected. He then completed dynamic reaching task, standing without AD to weight shift and place horseshoes on basketball rim. Required steadying assist and VCs for wide BOS and safety awareness. Pt ambulated back to room at end of session, left sitting in recliner with family present and all needs in reach.   Therapy Documentation Precautions:  Precautions Precautions: Fall, Cervical Required Braces or Orthoses: Cervical Brace Cervical Brace: Hard collar Restrictions Weight Bearing Restrictions: No Pain:   No/ denies pain ADL: ADL ADL Comments: see Functional Assessment Tool  See Function Navigator for Current Functional Status.   Therapy/Group: Individual Therapy  [name], Jahyra Sukup C 02/17/2016, 7:17 AM

## 2016-02-17 NOTE — Plan of Care (Signed)
Problem: RH Car Transfers Goal: LTG Patient will perform car transfers with assist (PT) LTG: Patient will perform car transfers with assistance (PT).  downgraded d/t decreased safety awareness, slow progress  Problem: RH Ambulation Goal: LTG Patient will ambulate in controlled environment (PT) LTG: Patient will ambulate in a controlled environment, # of feet with assistance (PT).  downgraded d/t decreased safety awareness, slow progress Goal: LTG Patient will ambulate in home environment (PT) LTG: Patient will ambulate in home environment, # of feet with assistance (PT).  downgraded d/t decreased safety awareness, slow progress Goal: LTG Patient will ambulate in community environment (PT) LTG: Patient will ambulate in community environment, # of feet with assistance (PT).  downgraded d/t decreased safety awareness, slow progress

## 2016-02-17 NOTE — Progress Notes (Signed)
[facility] PHYSICAL MEDICINE & REHABILITATION     PROGRESS NOTE  Subjective/Complaints:  Saw patient in the hall walking with PT. Apologizes for being "difficult" with speech therapist about diet this morning   ROS: Denies CP, SOB, N/V/D.  Objective: Vital Signs: Blood pressure (!) 126/36, pulse 71, temperature 98.3 F (36.8 C), temperature source Oral, resp. rate 18, height 5\' 8"  (1.727 m), weight 67.5 kg (148 lb 12.8 oz), SpO2 93 %. No results found. No results for input(s): WBC, HGB, HCT, PLT in the last 72 hours. No results for input(s): NA, K, CL, GLUCOSE, BUN, CREATININE, CALCIUM in the last 72 hours.  Invalid input(s): CO CBG (last 3)   Recent Labs  02/16/16 2204 02/17/16 0635 02/17/16 1148  GLUCAP 277* 140* 205*    Wt Readings from Last 3 Encounters:  02/15/16 67.5 kg (148 lb 12.8 oz)  02/09/16 70.9 kg (156 lb 4.9 oz)  02/05/16 72.6 kg (160 lb)    Physical Exam:  BP (!) 126/36 (BP Location: Right Arm)   Pulse 71   Temp 98.3 F (36.8 C) (Oral)   Resp 18   Ht 5\' 8"  (1.727 m)   Wt 67.5 kg (148 lb 12.8 oz)   SpO2 93%   BMI 22.62 kg/m  Constitutional: He appears well-developedand well-nourished. NAD.  HENT: Normocephalic.  Right Ear: External earnormal.  Left Ear: External earnormal.  Eyes: Conjunctivaeand EOMare normal.  Neck: Cervical collar in place Cardiovascular: Normal rateand regular rhythm. no murmurs or rubs Respiratory: Effort normaland breath sounds normal. No respiratory distress. No wheezes or rhonchi GI: Soft. Bowel sounds are normal. He exhibits no distension. Non tender. Musculoskeletal: He exhibits no edemaor tenderness.  Neurological: He is alertand oriented.  Motor: RUE: 4+/5 deltoid, bicep, tricep, wrist and HI 4-4+/5.  LUE: 4/5 proximal to distal RLE 4-/5 HF, KE and 4/5 ADF/PF.  LUE: 4/5 HF, KE, 4+/5 prox to distal Impaired FMC RUE and RLE. Neuro exam stable to improved  Skin: Skin is warmand dry.  Surgical site  clean/sutures out Psychiatric: He has a normal mood and affect. His behavior is generally anxious per baseline   Assessment/Plan: 1. 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 Physiatrist is providing close team supervision and 24 hour management of active medical problems listed below. Physiatrist and rehab team continue to assess barriers to discharge/monitor patient progress toward functional and medical goals.  Function:  Bathing Bathing position   Position: Shower  Bathing parts Body parts bathed by patient: Right arm, Left arm, Chest, Abdomen, Front perineal area, Right upper leg, Left upper leg, Buttocks, Right lower leg, Left lower leg Body parts bathed by helper: Back  Bathing assist Assist Level: Touching or steadying assistance(Pt > 75%)      Upper Body Dressing/Undressing Upper body dressing   What is the patient wearing?: Pull over shirt/dress, Orthosis     Pull over shirt/dress - Perfomed by patient: Thread/unthread right sleeve, Thread/unthread left sleeve, Put head through opening, Pull shirt over trunk Pull over shirt/dress - Perfomed by helper: Put head through opening, Pull shirt over trunk     Orthosis activity level: Performed by helper  Upper body assist Assist Level: Set up   Set up : To obtain clothing/put away, To apply TLSO, cervical collar  Lower Body Dressing/Undressing Lower body dressing   What is the patient wearing?: Pants, Underwear, Shoes Underwear - Performed by patient: Thread/unthread right underwear leg,  Pull underwear up/down, Thread/unthread left underwear leg Underwear - Performed by helper: Thread/unthread left underwear leg Pants- Performed by patient: Thread/unthread right pants leg, Thread/unthread left pants leg, Pull pants up/down Pants- Performed by helper: Fasten/unfasten pants         Shoes -  Performed by patient: Don/doff right shoe, Don/doff left shoe Shoes - Performed by helper: Fasten right          Lower body assist Assist for lower body dressing: Touching or steadying assistance (Pt > 75%)      Toileting Toileting   Toileting steps completed by patient: Adjust clothing prior to toileting, Adjust clothing after toileting, Performs perineal hygiene Toileting steps completed by helper: Performs perineal hygiene Toileting Assistive Devices: Grab bar or rail  Toileting assist Assist level: Touching or steadying assistance (Pt.75%)   Transfers Chair/bed transfer   Chair/bed transfer method: Stand pivot Chair/bed transfer assist level: Supervision or verbal cues Chair/bed transfer assistive device: Armrests, Medical sales representative     Max distance: 150 Assist level: Supervision or verbal cues   Wheelchair   Type: Manual Max wheelchair distance: 150 Assist Level: Supervision or verbal cues  Cognition Comprehension Comprehension assist level: Follows complex conversation/direction with extra time/assistive device  Expression Expression assist level: Expresses complex ideas: With extra time/assistive device  Social Interaction Social Interaction assist level: Interacts appropriately with others - No medications needed.  Problem Solving Problem solving assist level: Solves complex problems: With extra time  Memory Memory assist level: Complete Independence: No helper     Medical Problem List and Plan: 1. 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 Status post ACDF 01/16/2016 with postoperative epidural hematoma status post exploration and fusion removal of plate and bone graft 02/08/2016 followed by posterior cervical decompression 02/08/2016. Cervical collar at all times  Cont CIR.   -requires ongoing reassurance regarding recovery 2.  DVT Prophylaxis/Anticoagulation: SCDs/mobility.   Vascular studies negative 3. Pain Management:  Tylenol as needed.                        -limit pain meds due to SE 4. Diabetes mellitus. Hemoglobin A1c 6.4.   -now on amaryl 2mg  daily---sugars better  -SSI as we're weaning steroids    5. Neuropsych: This patient is capable of making decisions on his own behalf.  -suspect some of behavior is related to steroids 6. Skin/Wound Care: wound healing nicely  -weaning off steroids 7. Fluids/Electrolytes/Nutrition:   -push fluids--recheck Monday 8. Dysphagia. Dysphagia #2  thin liquid diet                       -follow up per SLP 9. Hypertension: Improved  Lisinopril 2.5 mg daily increased to 5mg  on 8/26  Monitor with increased mobility 10. Hypothyroidism. Continue Synthroid. 11. Hyperlipidemia. Lipitor   LOS (Days) 7 A FACE TO FACE EVALUATION WAS PERFORMED  Rheannon Cerney T 02/17/2016 12:37 PM

## 2016-02-17 NOTE — Progress Notes (Signed)
Speech Language Pathology Weekly Progress and Session Note  Patient Details  Name: Gary Craig MRN: [id number] Date of Birth: [birth year]  Beginning of progress report period: February 10, 2016 End of progress report period: February 17, 2016  Today's Date: 02/17/2016 SLP Individual Time: 0730-0825 SLP Individual Time Calculation (min): 55 min   Short Term Goals: Week 1: SLP Short Term Goal 1 (Week 1): Pt will consume dys 2 textures and thin liquids with mod I use of swallowing precautions and minimal overt s/s of aspiration.  SLP Short Term Goal 1 - Progress (Week 1): Not met SLP Short Term Goal 2 (Week 1): Pt will consume therapeutic trials of Dys. 3 textures with mod I use of swallowing precautions and minimal overt s/s of aspiration over 3 consecutive session prior to advancement.   SLP Short Term Goal 2 - Progress (Week 1): Not met    New Short Term Goals: Week 2: SLP Short Term Goal 1 (Week 2): Pt will consume dys 2 textures and thin liquids with mod I use of swallowing precautions and minimal overt s/s of aspiration.  SLP Short Term Goal 2 (Week 2): Pt will consume therapeutic trials of Dys. 3 textures with mod I use of swallowing precautions and minimal overt s/s of aspiration over 3 consecutive session prior to advancement.    Weekly Progress Updates: Patient has made functional but inconsistent gains and has met 0 of 2 STG's this reporting period. Currently, patient is consuming Dys. 2 textures with thin liquids and continues to require intermittent Supervision verbal cues for use of swallowing compensatory strategies. Patient also demonstrates consistent overt s/s of aspiration with current diet, therefore, trials of Dys. 3 textures have not been attempted due to safety concerns. Patient and family report what appears to be a h/o swallowing difficulty prior to surgery which SLP suspecst is now exacerbated in addition to edema from surgery. Patient would benefit from continued  skilled SLP intervention to maximize swallowing function and overall safety with least restrictive diet prior to d/c home.    Intensity: Minumum of 1-2 x/day, 30 to 90 minutes Frequency: 3 to 5 out of 7 days Duration/Length of Stay: 02/24/16 Treatment/Interventions: Dysphagia/aspiration precaution training;Patient/family education;Environmental controls;Cueing hierarchy;Functional tasks;Therapeutic Activities;Internal/external aids   Daily Session  Skilled Therapeutic Interventions: Skilled treatment session focused on dysphagia goals. Upon arrival, patient sitting upright in the wheelchair and had already consumed his breakfast. Patient reported he was "firing" this SLP because he was ready to eat a pork chop and no longer needed therapy. Patient re-educated on his current swallowing function and consistent signs of overt s/s of aspiration on current diet indicating he was not ready to upgrade at this time. Patient vebralized understanding but will need reinforcement. Patient consumed trials of Dys. 3 textures. Patient demonstrated efficient mastication with complete oral clearance with consistent throat clearing and intermittent coughing. Patient also demonstrated increased coughing with thin liquids via straw due to large, sequential sips despite Max A verbal cues. Recommend patient continue current diet of Dys. 2 textures with thin liquids. Patient left upright in wheelchair with all needs within reach. Continue with current plan of care.      Function:   Eating Eating   Modified Consistency Diet: Yes Eating Assist Level: Supervision or verbal cues;Set up assist for   Eating Set Up Assist For: Opening containers       Cognition Comprehension Comprehension assist level: Follows complex conversation/direction with extra time/assistive device  Expression   Expression assist level: Expresses complex  ideas: With extra time/assistive device  Social Interaction Social Interaction assist level:  Interacts appropriately with others - No medications needed.  Problem Solving Problem solving assist level: Solves complex problems: With extra time  Memory Memory assist level: Complete Independence: No helper   Pain No/Denies Pain   Therapy/Group: Individual Therapy  Shihab States 02/17/2016, 3:15 PM

## 2016-02-18 ENCOUNTER — Inpatient Hospital Stay (HOSPITAL_COMMUNITY): Payer: Medicare Other

## 2016-02-18 ENCOUNTER — Inpatient Hospital Stay (HOSPITAL_COMMUNITY): Payer: Medicare Other | Admitting: Speech Pathology

## 2016-02-18 LAB — GLUCOSE, CAPILLARY
GLUCOSE-CAPILLARY: 171 mg/dL — AB (ref 65–99)
Glucose-Capillary: 132 mg/dL — ABNORMAL HIGH (ref 65–99)
Glucose-Capillary: 229 mg/dL — ABNORMAL HIGH (ref 65–99)
Glucose-Capillary: 231 mg/dL — ABNORMAL HIGH (ref 65–99)

## 2016-02-18 NOTE — Progress Notes (Signed)
PHYSICAL MEDICINE & REHABILITATION     PROGRESS NOTE  Subjective/Complaints:  No new issues. Still impatient with recovery. Denies pain  ROS: Denies CP, SOB, N/V/D.  Objective: Vital Signs: Blood pressure (!) 124/56, pulse 66, temperature 97.7 F (36.5 C), temperature source Oral, resp. rate 18, height 5\' 8"  (1.727 m), weight 67.5 kg (148 lb 12.8 oz), SpO2 99 %. No results found. No results for input(s): WBC, HGB, HCT, PLT in the last 72 hours. No results for input(s): NA, K, CL, GLUCOSE, BUN, CREATININE, CALCIUM in the last 72 hours.  Invalid input(s): CO CBG (last 3)   Recent Labs  02/17/16 1644 02/17/16 2032 02/18/16 0641  GLUCAP 192* 245* 132*    Wt Readings from Last 3 Encounters:  02/15/16 67.5 kg (148 lb 12.8 oz)  02/09/16 70.9 kg (156 lb 4.9 oz)  02/05/16 72.6 kg (160 lb)    Physical Exam:  BP (!) 124/56 (BP Location: Right Arm)   Pulse 66   Temp 97.7 F (36.5 C) (Oral)   Resp 18   Ht 5\' 8"  (1.727 m)   Wt 67.5 kg (148 lb 12.8 oz)   SpO2 99%   BMI 22.62 kg/m  Constitutional: He appears well-developedand well-nourished. NAD.  HENT: Normocephalic.  Right Ear: External earnormal.  Left Ear: External earnormal.  Eyes: Conjunctivaeand EOMare normal.  Neck: Cervical collar in place Cardiovascular: Normal rateand regular rhythm. no murmurs or rubs Respiratory: Effort normaland breath sounds normal. No respiratory distress. No wheezes or rhonchi GI: Soft. Bowel sounds are normal. He exhibits no distension. Non tender. Musculoskeletal: He exhibits no edemaor tenderness.  Neurological: He is alertand oriented.  Motor: RUE: 4+/5 deltoid, bicep, tricep, wrist and HI 4-4+/5.  LUE: 4/5 proximal to distal RLE 4-/5 HF, KE and 4/5 ADF/PF.  LUE: 4/5 HF, KE, 4+/5 prox to distal Impaired FMC RUE and RLE. Neuro exam stable to improved  Skin: Skin is warmand dry.  Surgical site clean/closed Psychiatric: He has a normal mood and affect. His  behavior is generally anxious per baseline   Assessment/Plan: 1. 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 Physiatrist is providing close team supervision and 24 hour management of active medical problems listed below. Physiatrist and rehab team continue to assess barriers to discharge/monitor patient progress toward functional and medical goals.  Function:  Bathing Bathing position   Position: Shower  Bathing parts Body parts bathed by patient: Right arm, Left arm, Chest, Abdomen, Front perineal area, Right upper leg, Left upper leg, Buttocks, Right lower leg, Left lower leg Body parts bathed by helper: Back  Bathing assist Assist Level: Touching or steadying assistance(Pt > 75%)      Upper Body Dressing/Undressing Upper body dressing   What is the patient wearing?: Pull over shirt/dress, Orthosis     Pull over shirt/dress - Perfomed by patient: Thread/unthread right sleeve, Thread/unthread left sleeve, Put head through opening, Pull shirt over trunk Pull over shirt/dress - Perfomed by helper: Put head through opening, Pull shirt over trunk     Orthosis activity level: Performed by helper  Upper body assist Assist Level: Set up   Set up : To obtain clothing/put away, To apply TLSO, cervical collar  Lower Body Dressing/Undressing Lower body dressing   What is the patient wearing?: Pants, Underwear, Shoes Underwear - Performed by patient: Thread/unthread right underwear leg, Pull underwear up/down, Thread/unthread left underwear leg Underwear - Performed by helper:  Thread/unthread left underwear leg Pants- Performed by patient: Thread/unthread right pants leg, Thread/unthread left pants leg, Pull pants up/down Pants- Performed by helper: Fasten/unfasten pants         Shoes - Performed by patient: Don/doff right shoe, Don/doff left shoe Shoes -  Performed by helper: Fasten right          Lower body assist Assist for lower body dressing: Touching or steadying assistance (Pt > 75%)      Toileting Toileting   Toileting steps completed by patient: Adjust clothing prior to toileting, Adjust clothing after toileting, Performs perineal hygiene Toileting steps completed by helper: Performs perineal hygiene Toileting Assistive Devices: Grab bar or rail  Toileting assist Assist level: Touching or steadying assistance (Pt.75%)   Transfers Chair/bed transfer   Chair/bed transfer method: Stand pivot Chair/bed transfer assist level: Supervision or verbal cues Chair/bed transfer assistive device: Armrests, Medical sales representative     Max distance: 150 Assist level: Supervision or verbal cues   Wheelchair   Type: Manual Max wheelchair distance: 150 Assist Level: Supervision or verbal cues  Cognition Comprehension Comprehension assist level: Follows complex conversation/direction with extra time/assistive device  Expression Expression assist level: Expresses complex ideas: With extra time/assistive device  Social Interaction Social Interaction assist level: Interacts appropriately with others - No medications needed.  Problem Solving Problem solving assist level: Solves complex problems: With extra time  Memory Memory assist level: Complete Independence: No helper     Medical Problem List and Plan: 1. 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 Status post ACDF 01/16/2016 with postoperative epidural hematoma status post exploration and fusion removal of plate and bone graft 02/08/2016 followed by posterior cervical decompression 02/08/2016. Cervical collar at all times  Cont CIR.   -requires ongoing reassurance regarding recovery 2.  DVT Prophylaxis/Anticoagulation: SCDs/mobility.   Vascular studies negative 3. Pain Management: Tylenol as needed.                        -limit pain meds due to SE 4.  Diabetes mellitus. Hemoglobin A1c 6.4.   -now on amaryl 2mg  daily---sugars improving  -decadron now only at 2mg  bid  -SSI as we're weaning steroids    5. Neuropsych: This patient is capable of making decisions on his own behalf.  -suspect some of behavior is related to steroids 6. Skin/Wound Care: wound healing nicely  -weaning off steroids 7. Fluids/Electrolytes/Nutrition:   -push fluids--recheck Monday 8. Dysphagia. Dysphagia #2  thin liquid diet                       -follow up per SLP 9. Hypertension: Improved  Lisinopril 2.5 mg daily increased to 5mg  on 8/26  Monitor with increased mobility 10. Hypothyroidism. Continue Synthroid. 11. Hyperlipidemia. Lipitor   LOS (Days) 8 A FACE TO FACE EVALUATION WAS PERFORMED  SWARTZ,ZACHARY T 02/18/2016 9:17 AM

## 2016-02-18 NOTE — Progress Notes (Signed)
Speech Language Pathology Daily Session Note  Patient Details  Name: Gary Craig MRN: [id number] Date of Birth: [birth year]  Today's Date: 02/18/2016 SLP Individual Time: 1302-1330 SLP Individual Time Calculation (min): 28 min   Short Term Goals: Week 2: SLP Short Term Goal 1 (Week 2): Pt will consume dys 2 textures and thin liquids with mod I use of swallowing precautions and minimal overt s/s of aspiration.  SLP Short Term Goal 2 (Week 2): Pt will consume therapeutic trials of Dys. 3 textures with mod I use of swallowing precautions and minimal overt s/s of aspiration over 3 consecutive session prior to advancement.    Skilled Therapeutic Interventions:   Skilled treatment session focused on addressing dysphagia goals. SLP facilitated session by providing skilled observation of patient consuming graham crackers and thin liquids via straw with Mod I use of safes swallow/compensatory strategies with minimal overt s/s of aspiration.  Patient with no episodes of coughing and demonstrated a subtle throat clear x4 during snack.  Patient with a significant improvement in function as compared to last visit.  As a result, diet advanced to Dys.3 textures with continuation of thin liquids, meds whole in puree, set-up assist and intermittent supervision.  Daughter present for session and SLP educated she and patient regarding updated diet restrictions and provided both with a handout for carryover.  Plan for SLP to follow up Monday; RN made aware that is patient demonstrates difficulty or there is concern to downgrade back to Dys.2 textures.    Function:  Eating Eating   Modified Consistency Diet: Yes Eating Assist Level: Supervision or verbal cues;Set up assist for   Eating Set Up Assist For: Opening containers       Cognition Comprehension Comprehension assist level: Follows complex conversation/direction with extra time/assistive device  Expression   Expression assist level: Expresses  complex ideas: With extra time/assistive device  Social Interaction Social Interaction assist level: Interacts appropriately with others - No medications needed.  Problem Solving Problem solving assist level: Solves complex problems: With extra time  Memory Memory assist level: Complete Independence: No helper    Pain Pain Assessment Pain Assessment: No/denies pain  Therapy/Group: Individual Therapy  Gary Craig., [place] [id number]  [name] 02/18/2016, 4:03 PM

## 2016-02-18 NOTE — Progress Notes (Signed)
Physical Therapy Note  Patient Details  Name: RIDGELY FAWCETT MRN: [id number] Date of Birth: 1934-04-03 Today's Date: 02/18/2016  1030-1100, 30 min individual tx Pain: none reported   Therapeutic exercise performed with LE to increase strength for functional mobility: seated without use of UEs for support-R/L long arc quad knee ext (R assisted), marching, heel /toe raises, bil hip adduction for core activation, x 8 each; standing mini squats with R/L foot back, x 5 reps, mod assist for balance, and R/L hip abduction with bil UE support on back of chair, x 10 each. . Sit> stand at [facility] with LOB backwards regained independently with pt lifting RW up in the air.  Gait with RW on level tile and carpet x 100' x 2, plus transitional movements to sit in armchair and scooting up to table.  Pt left resting in recliner with all needs within reach.   Rusty Glodowski 02/18/2016, 7:56 AM

## 2016-02-19 ENCOUNTER — Inpatient Hospital Stay (HOSPITAL_COMMUNITY): Payer: Medicare Other | Admitting: Occupational Therapy

## 2016-02-19 LAB — GLUCOSE, CAPILLARY
GLUCOSE-CAPILLARY: 188 mg/dL — AB (ref 65–99)
Glucose-Capillary: 225 mg/dL — ABNORMAL HIGH (ref 65–99)
Glucose-Capillary: 212 mg/dL — ABNORMAL HIGH (ref 65–99)
Glucose-Capillary: 311 mg/dL — ABNORMAL HIGH (ref 65–99)

## 2016-02-19 LAB — URINE CULTURE

## 2016-02-19 MED ORDER — GLIMEPIRIDE 2 MG PO TABS
2.0000 mg | ORAL_TABLET | Freq: Once | ORAL | Status: AC
  Administered 2016-02-19: 2 mg via ORAL
  Filled 2016-02-19: qty 1

## 2016-02-19 MED ORDER — DEXAMETHASONE 2 MG PO TABS
1.0000 mg | ORAL_TABLET | Freq: Two times a day (BID) | ORAL | Status: DC
  Administered 2016-02-19 – 2016-02-21 (×4): 1 mg via ORAL
  Filled 2016-02-19 (×4): qty 1

## 2016-02-19 MED ORDER — GLIMEPIRIDE 2 MG PO TABS
4.0000 mg | ORAL_TABLET | Freq: Every day | ORAL | Status: DC
  Administered 2016-02-20 – 2016-02-24 (×5): 4 mg via ORAL
  Filled 2016-02-19 (×5): qty 2

## 2016-02-19 MED ORDER — MEGESTROL ACETATE 400 MG/10ML PO SUSP
400.0000 mg | Freq: Every day | ORAL | Status: DC
  Administered 2016-02-20: 400 mg via ORAL
  Filled 2016-02-19: qty 10

## 2016-02-19 NOTE — Progress Notes (Signed)
Gary Craig     PROGRESS NOTE  Subjective/Complaints:  Eating breakfast. Appetite now vigorous!!   ROS: Denies CP, SOB, N/V/D.  Objective: Vital Signs: Blood pressure 119/63, pulse 60, temperature 97.7 F (36.5 C), temperature source Oral, resp. rate 18, height 5\' 8"  (1.727 m), weight 67.5 kg (148 lb 12.8 oz), SpO2 100 %. No results found. No results for input(s): WBC, HGB, HCT, PLT in the last 72 hours. No results for input(s): NA, K, CL, GLUCOSE, BUN, CREATININE, CALCIUM in the last 72 hours.  Invalid input(s): CO CBG (last 3)   Recent Labs  02/18/16 1635 02/18/16 2109 02/19/16 0651  GLUCAP 229* 231* 188*    Wt Readings from Last 3 Encounters:  02/15/16 67.5 kg (148 lb 12.8 oz)  02/09/16 70.9 kg (156 lb 4.9 oz)  02/05/16 72.6 kg (160 lb)    Physical Exam:  BP 119/63 (BP Location: Right Arm)   Pulse 60   Temp 97.7 F (36.5 C) (Oral)   Resp 18   Ht 5\' 8"  (1.727 m)   Wt 67.5 kg (148 lb 12.8 oz)   SpO2 100%   BMI 22.62 kg/m  Constitutional: He appears well-developedand well-nourished. NAD.  HENT: Normocephalic.  Right Ear: External earnormal.  Left Ear: External earnormal.  Eyes: Conjunctivaeand EOMare normal.  Neck: Cervical collar in place Cardiovascular: Normal rateand regular rhythm. no murmurs or rubs Respiratory: Effort normaland breath sounds normal. No respiratory distress. No wheezes or rhonchi GI: Soft. Bowel sounds are normal. He exhibits no distension. Non tender. Musculoskeletal: He exhibits no edemaor tenderness.  Neurological: He is alertand oriented.  Motor: RUE: 4+/5 deltoid, bicep, tricep, wrist and HI 4-4+/5.  LUE: 4/5 proximal to distal RLE 4-/5 HF, KE and 4/5 ADF/PF.  LUE: 4/5 HF, KE, 4+/5 prox to distal Impaired FMC RUE and RLE. Neuro exam stable to improved  Skin: Skin is warmand dry.  Surgical site clean/closed Psychiatric: He has a normal mood and affect. His behavior is generally  anxious per baseline   Assessment/Plan: 1. 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 Physiatrist is providing close team supervision and 24 hour management of active medical problems listed below. Physiatrist and rehab team continue to assess barriers to discharge/monitor patient progress toward functional and medical goals.  Function:  Bathing Bathing position   Position: Shower  Bathing parts Body parts bathed by patient: Right arm, Left arm, Chest, Abdomen, Front perineal area, Right upper leg, Left upper leg, Buttocks, Right lower leg, Left lower leg Body parts bathed by helper: Back  Bathing assist Assist Level: Touching or steadying assistance(Pt > 75%)      Upper Body Dressing/Undressing Upper body dressing   What is the patient wearing?: Pull over shirt/dress, Orthosis     Pull over shirt/dress - Perfomed by patient: Thread/unthread right sleeve, Thread/unthread left sleeve, Put head through opening, Pull shirt over trunk Pull over shirt/dress - Perfomed by helper: Put head through opening, Pull shirt over trunk     Orthosis activity level: Performed by helper  Upper body assist Assist Level: Set up   Set up : To obtain clothing/put away, To apply TLSO, cervical collar  Lower Body Dressing/Undressing Lower body dressing   What is the patient wearing?: Pants, Underwear, Shoes Underwear - Performed by patient: Thread/unthread right underwear leg, Pull underwear up/down, Thread/unthread left underwear leg Underwear - Performed by helper: Thread/unthread left underwear leg Pants-  Performed by patient: Thread/unthread right pants leg, Thread/unthread left pants leg, Pull pants up/down Pants- Performed by helper: Fasten/unfasten pants         Shoes - Performed by patient: Don/doff right shoe, Don/doff left shoe Shoes - Performed by helper: Fasten  right          Lower body assist Assist for lower body dressing: Touching or steadying assistance (Pt > 75%)      Toileting Toileting   Toileting steps completed by patient: Adjust clothing prior to toileting, Performs perineal hygiene, Adjust clothing after toileting Toileting steps completed by helper: Performs perineal hygiene Toileting Assistive Devices: Grab bar or rail  Toileting assist Assist level: Touching or steadying assistance (Pt.75%)   Transfers Chair/bed transfer   Chair/bed transfer method: Stand pivot Chair/bed transfer assist level: Touching or steadying assistance (Pt > 75%) Chair/bed transfer assistive device: Armrests, Medical sales representative     Max distance: 100 Assist level: Touching or steadying assistance (Pt > 75%)   Wheelchair   Type: Manual Max wheelchair distance: 150 Assist Level: Supervision or verbal cues  Cognition Comprehension Comprehension assist level: Follows complex conversation/direction with extra time/assistive device  Expression Expression assist level: Expresses complex ideas: With extra time/assistive device  Social Interaction Social Interaction assist level: Interacts appropriately with others - No medications needed.  Problem Solving Problem solving assist level: Solves complex problems: With extra time  Memory Memory assist level: Complete Independence: No helper     Medical Problem List and Plan: 1. 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 Status post ACDF 01/16/2016 with postoperative epidural hematoma status post exploration and fusion removal of plate and bone graft 02/08/2016 followed by posterior cervical decompression 02/08/2016. Cervical collar at all times  Cont CIR.   -making steady progress 2.  DVT Prophylaxis/Anticoagulation: SCDs/mobility.   Vascular studies negative 3. Pain Management: Tylenol as needed.                        -limit pain meds due to SE 4. Diabetes mellitus.  Hemoglobin A1c 6.4.   -now on amaryl 2mg  daily---increase to 4mg  daily  -decadron taper to 1mg  bid  -SSI as we're weaning steroids    5. Neuropsych: This patient is capable of making decisions on his own behalf.  -suspect some of behavior is related to steroids 6. Skin/Wound Care: wound healing nicely  -weaning off steroids 7. Fluids/Electrolytes/Nutrition:   -push fluids--recheck Monday 8. Dysphagia. Dysphagia #2  thin liquid diet                       -follow up per SLP 9. Hypertension: Improved  Lisinopril 2.5 mg daily increased to 5mg  on 8/26    10. Hypothyroidism. Continue Synthroid. 11. Hyperlipidemia. Lipitor   LOS (Days) 9 A FACE TO FACE EVALUATION WAS PERFORMED  Gary Craig 02/19/2016 8:51 AM

## 2016-02-19 NOTE — Progress Notes (Signed)
Occupational Therapy Session Note  Patient Details  Name: Gary Craig MRN: [id number] Date of Birth: [birth year]  Today's Date: 02/19/2016 OT Individual Time: 0900-1000 OT Individual Time Calculation (min): 60 min     Short Term Goals:Week 2:  OT Short Term Goal 1 (Week 2): STG=LTG due to LOS  Skilled Therapeutic Interventions/Progress Updates:    Pt seen for OT session focusing on ADL retraining and functional mobility. Pt sitting up in w/c upon arrival, voicing need for toileting task. He ambulated into bathroom with CGA using RW and completed toileting with steadying assist. He ambulated to ADL apartment and competeed bed mobility on standard bed to simulate home environment. Completed at supervision- mod I level.  In therapy gym, pt attempted to button small buttons on men's shirt. Pt required significantly increased time and frustration and therefore task ceased in order to find new activity with just right challenge.  Pt set-up with nut/ bolt activity, required to find matching bolts nuts and washers and fasten all using B UEs. Completed with increaed time and rest breaks throughout. Pt ambulated back to room at end of session, left sitting in recliner with all needs in reach. Reviewed need to use call bell for all mobility needs.  Pt cont to be impulsive with decreased awareness of deficits. Pt not believing he needed to use RW to ambulate into bathroom and requiring cues throughout to slow down and for safety awareness.   Therapy Documentation Precautions:  Precautions Precautions: Fall, Cervical Required Braces or Orthoses: Cervical Brace Cervical Brace: Hard collar Restrictions Weight Bearing Restrictions: No Pain: Pain Assessment Pain Assessment: No/denies pain ADL: ADL ADL Comments: see Functional Assessment Tool  See Function Navigator for Current Functional Status.   Therapy/Group: Individual Therapy  [name], Gladie Gravette C 02/19/2016, 6:47 AM

## 2016-02-20 ENCOUNTER — Inpatient Hospital Stay (HOSPITAL_COMMUNITY): Payer: Medicare Other | Admitting: Physical Therapy

## 2016-02-20 ENCOUNTER — Inpatient Hospital Stay (HOSPITAL_COMMUNITY): Payer: Medicare Other | Admitting: Speech Pathology

## 2016-02-20 ENCOUNTER — Inpatient Hospital Stay (HOSPITAL_COMMUNITY): Payer: Medicare Other | Admitting: Occupational Therapy

## 2016-02-20 LAB — BASIC METABOLIC PANEL
ANION GAP: 7 (ref 5–15)
BUN: 14 mg/dL (ref 6–20)
CO2: 24 mmol/L (ref 22–32)
Calcium: 8.9 mg/dL (ref 8.9–10.3)
Chloride: 102 mmol/L (ref 101–111)
Creatinine, Ser: 0.73 mg/dL (ref 0.61–1.24)
GFR calc Af Amer: >60 mL/min (ref >60)
GLUCOSE: 209 mg/dL — AB (ref 65–99)
POTASSIUM: 4.5 mmol/L (ref 3.5–5.1)
Sodium: 133 mmol/L — ABNORMAL LOW (ref 135–145)

## 2016-02-20 LAB — GLUCOSE, CAPILLARY
GLUCOSE-CAPILLARY: 198 mg/dL — AB (ref 65–99)
GLUCOSE-CAPILLARY: 259 mg/dL — AB (ref 65–99)
Glucose-Capillary: 159 mg/dL — ABNORMAL HIGH (ref 65–99)
Glucose-Capillary: 211 mg/dL — ABNORMAL HIGH (ref 65–99)

## 2016-02-20 MED ORDER — SULFAMETHOXAZOLE-TRIMETHOPRIM 400-80 MG PO TABS
1.0000 | ORAL_TABLET | Freq: Two times a day (BID) | ORAL | Status: DC
  Administered 2016-02-20 – 2016-02-24 (×9): 1 via ORAL
  Filled 2016-02-20 (×9): qty 1

## 2016-02-20 NOTE — Plan of Care (Signed)
Problem: RH PAIN MANAGEMENT Goal: RH STG PAIN MANAGED AT OR BELOW PT'S PAIN GOAL 2 or less  Outcome: Progressing No c/o pain

## 2016-02-20 NOTE — Progress Notes (Signed)
Physical Therapy Session Note  Patient Details  Name: Gary Craig MRN: [id number] Date of Birth: 1934/04/19  Today's Date: 02/20/2016 PT Individual Time: 0800-0900 and 1300-1357 PT Individual Time Calculation (min): 60 min and 57 min (total 117 min)    Short Term Goals: Week 2:  PT Short Term Goal 1 (Week 2): =LTG due to estimated LOS  Skilled Therapeutic Interventions/Progress Updates:   Tx 1: Pt received seated in w/c, denies pain and agreeable to treatment. Gait to gym with RW and S x150'. Stairs 1x16 on 6" steps with BUE handrails and S. Lateral step ups 2x15 BLE with BUE support on rail. Heel raises 2x15. Supine PNF D1 flexion/extension with rhythmic initiation and combination of isotonics light manual resistance performed to fatigue. Supine bridging with stability ball, maxA and repetitive cueing for slowing performance of exercise and eccentric control to return to mat table; difficulty maintaining feet/LEs in midline and poor awareness when out of midline with pt impulsively bridging out of midline and falling off ball. Activity downgraded to maintaining ball in midline with tactile feedback object between feet for internal rotation and core activation, while therapist provided external perturbations to ball and pt maintained/returned ball to midline using core/LEs. Gait to return to room with RW and S. Remained seated in w/c at end of session, all needs in reach.  Tx 2: Pt received seated in w/c, c/o pain as below and agreeable to treatment. Gait to gym with RW and close S. Quadruped resisted arm/leg extensions with level 1 theraband; required max cues for body alignment and technique. Supine PNF D1 flexion/extension combination of isometrics 2x8 reps each side before fatigued. Sit <>stand 2x10 with adduction isometric ball squeeze for increased core activation and stability. Rocker board anterior/posterior and medial/lateral with occasional UE support and modA to recover LOB while engaged  in beach ball catching/throwing with daughter. Returned to room gait x150' HHA and min/modA overall with occasional staggering however improving stability and coordination. Remained seated in recliner at end of session, all needs in reach.    Therapy Documentation Precautions:  Precautions Precautions: Fall, Cervical Required Braces or Orthoses: Cervical Brace Cervical Brace: Hard collar Restrictions Weight Bearing Restrictions: No Pain: Pain Assessment Pain Assessment: 0-10 Pain Score: 3  Pain Type: Acute pain Pain Location: Neck Pain Descriptors / Indicators: Aching Pain Frequency: Intermittent Pain Onset: On-going Pain Intervention(s): Medication (See eMAR) Multiple Pain Sites: No   See Function Navigator for Current Functional Status.   Therapy/Group: Individual Therapy  [name] 02/20/2016, 8:58 AM

## 2016-02-20 NOTE — Progress Notes (Signed)
Speech Language Pathology Daily Session Note  Patient Details  Name: Gary Craig MRN: [id number] Date of Birth: 25-Jun-1933  Today's Date: 02/20/2016 SLP Individual Time: 0930-1025 SLP Individual Time Calculation (min): 55 min   Short Term Goals: Week 2: SLP Short Term Goal 1 (Week 2): Pt will consume dys 2 textures and thin liquids with mod I use of swallowing precautions and minimal overt s/s of aspiration.  SLP Short Term Goal 2 (Week 2): Pt will consume therapeutic trials of Dys. 3 textures with mod I use of swallowing precautions and minimal overt s/s of aspiration over 3 consecutive session prior to advancement.    Skilled Therapeutic Interventions: Skilled treatment session focused on dysphagia goals. SLP facilitated session by providing skilled observation with snack of Dys. 3 textures with thin liquids. Patient consumed snack with minimal overt s/s of aspiration and utilized swallowing compensatory strategies with Mod I in a moderately distracting environment. Recommend patient continue current diet. Patient also re-educated on clinical reasoning for taking medications whole with puree, he verbalized understanding but will need reinforcement. Patient left upright in wheelchair with all needs within reach. Continue with current plan of care.   Function:  Eating Eating   Modified Consistency Diet: Yes Eating Assist Level: Supervision or verbal cues;Set up assist for   Eating Set Up Assist For: Opening containers       Cognition Comprehension Comprehension assist level: Follows complex conversation/direction with extra time/assistive device  Expression   Expression assist level: Expresses complex ideas: With extra time/assistive device  Social Interaction Social Interaction assist level: Interacts appropriately with others - No medications needed.  Problem Solving Problem solving assist level: Solves complex problems: With extra time  Memory Memory assist level: Complete  Independence: No helper    Pain Pain Assessment Pain Score: 0-No pain  Therapy/Group: Individual Therapy  Shayna Eblen 02/20/2016, 11:04 AM

## 2016-02-20 NOTE — Progress Notes (Signed)
[place] PHYSICAL MEDICINE & REHABILITATION     PROGRESS NOTE  Subjective/Complaints:  No complaints. In good spirits. Appetite vigorous   ROS: Denies CP, SOB, N/V/D.  Objective: Vital Signs: Blood pressure (!) 119/58, pulse 64, temperature 97.6 F (36.4 C), temperature source Oral, resp. rate 16, height 5\' 8"  (1.727 m), weight 67.5 kg (148 lb 12.8 oz), SpO2 100 %. No results found. No results for input(s): WBC, HGB, HCT, PLT in the last 72 hours.  Recent Labs  02/20/16 0426  NA 133*  K 4.5  CL 102  GLUCOSE 209*  BUN 14  CREATININE 0.73  CALCIUM 8.9   CBG (last 3)   Recent Labs  02/19/16 1630 02/19/16 2032 02/20/16 0638  GLUCAP 212* 311* 198*    Wt Readings from Last 3 Encounters:  02/15/16 67.5 kg (148 lb 12.8 oz)  02/09/16 70.9 kg (156 lb 4.9 oz)  02/05/16 72.6 kg (160 lb)    Physical Exam:  BP (!) 119/58 (BP Location: Left Arm)   Pulse 64   Temp 97.6 F (36.4 C) (Oral)   Resp 16   Ht 5\' 8"  (1.727 m)   Wt 67.5 kg (148 lb 12.8 oz)   SpO2 100%   BMI 22.62 kg/m  Constitutional: He appears well-developedand well-nourished. NAD.  HENT: Normocephalic.  Right Ear: External earnormal.  Left Ear: External earnormal.  Eyes: Conjunctivaeand EOMare normal.  Neck: Cervical collar in place Cardiovascular: Normal rateand regular rhythm. no murmurs or rubs Respiratory: Effort normaland breath sounds normal. No respiratory distress. No wheezes or rhonchi GI: Soft. Bowel sounds are normal. He exhibits no distension. Non tender. Musculoskeletal: He exhibits no edemaor tenderness.  Neurological: He is alertand oriented.  Motor: RUE: 4+/5 deltoid, bicep, tricep, wrist and HI 4-4+/5.  LUE: 4/5 proximal to distal RLE 4-/5 HF, KE and 4/5 ADF/PF.  LUE: 4/5 HF, KE, 4+/5 prox to distal Impaired FMC RUE and RLE. Neuro exam stable to improved  Skin: Skin is warmand dry.  Surgical site clean/closed Psychiatric: He has a normal mood and affect. His behavior  is generally anxious per baseline   Assessment/Plan: 1. 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 Physiatrist is providing close team supervision and 24 hour management of active medical problems listed below. Physiatrist and rehab team continue to assess barriers to discharge/monitor patient progress toward functional and medical goals.  Function:  Bathing Bathing position   Position: Shower  Bathing parts Body parts bathed by patient: Right arm, Left arm, Chest, Abdomen, Front perineal area, Right upper leg, Left upper leg, Buttocks, Right lower leg, Left lower leg Body parts bathed by helper: Back  Bathing assist Assist Level: Touching or steadying assistance(Pt > 75%)      Upper Body Dressing/Undressing Upper body dressing   What is the patient wearing?: Pull over shirt/dress, Orthosis     Pull over shirt/dress - Perfomed by patient: Thread/unthread right sleeve, Thread/unthread left sleeve, Put head through opening, Pull shirt over trunk Pull over shirt/dress - Perfomed by helper: Put head through opening, Pull shirt over trunk     Orthosis activity level: Performed by helper  Upper body assist Assist Level: Set up   Set up : To obtain clothing/put away, To apply TLSO, cervical collar  Lower Body Dressing/Undressing Lower body dressing   What is the patient wearing?: Pants, Underwear, Shoes Underwear - Performed by patient: Thread/unthread right underwear leg, Pull underwear up/down, Thread/unthread  left underwear leg Underwear - Performed by helper: Thread/unthread left underwear leg Pants- Performed by patient: Thread/unthread right pants leg, Thread/unthread left pants leg, Pull pants up/down Pants- Performed by helper: Fasten/unfasten pants         Shoes - Performed by patient: Don/doff right shoe, Don/doff left shoe Shoes - Performed by  helper: Fasten right          Lower body assist Assist for lower body dressing: Touching or steadying assistance (Pt > 75%)      Toileting Toileting   Toileting steps completed by patient: Adjust clothing prior to toileting, Performs perineal hygiene, Adjust clothing after toileting Toileting steps completed by helper: Performs perineal hygiene Toileting Assistive Devices: Grab bar or rail  Toileting assist Assist level: Touching or steadying assistance (Pt.75%)   Transfers Chair/bed transfer   Chair/bed transfer method: Ambulatory Chair/bed transfer assist level: Supervision or verbal cues Chair/bed transfer assistive device: Medical sales representative     Max distance: 150 Assist level: Supervision or verbal cues   Wheelchair   Type: Manual Max wheelchair distance: 150 Assist Level: Supervision or verbal cues  Cognition Comprehension Comprehension assist level: Follows complex conversation/direction with extra time/assistive device  Expression Expression assist level: Expresses complex ideas: With extra time/assistive device  Social Interaction Social Interaction assist level: Interacts appropriately with others - No medications needed.  Problem Solving Problem solving assist level: Solves complex problems: With extra time  Memory Memory assist level: Complete Independence: No helper     Medical Problem List and Plan: 1. 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 Status post ACDF 01/16/2016 with postoperative epidural hematoma status post exploration and fusion removal of plate and bone graft 02/08/2016 followed by posterior cervical decompression 02/08/2016. Cervical collar at all times  Cont CIR.   -making steady progress 2.  DVT Prophylaxis/Anticoagulation: SCDs/mobility.   Vascular studies negative 3. Pain Management: Tylenol as needed.                        -limit pain meds due to SE 4. Diabetes mellitus. Pt admits to eating pasta/carbs  yesterday  -eating more in general now  -amaryl increased to 4mg  daily  -decadron taper to 1mg  bid  -SSI as we're weaning steroids    5. Neuropsych: This patient is capable of making decisions on his own behalf.  -suspect some of behavior is related to steroids 6. Skin/Wound Care: wound healing nicely  -weaning off steroids 7. Fluids/Electrolytes/Nutrition:   -push fluids--recheck Monday 8. Dysphagia. Dysphagia #2  thin liquid diet                       -follow up per SLP 9. Hypertension: Improved  Lisinopril 2.5 mg daily increased to 5mg  on 8/26    10. Hypothyroidism. Continue Synthroid. 11. Hyperlipidemia. Lipitor   LOS (Days) 10 A FACE TO FACE EVALUATION WAS PERFORMED  Mckinna Demars T 02/20/2016 9:39 AM

## 2016-02-20 NOTE — Progress Notes (Signed)
Occupational Therapy Session Note  Patient Details  Name: Gary Craig MRN: [id number] Date of Birth: [birth year]  Today's Date: 02/20/2016 OT Individual Time: 1035-1200 OT Individual Time Calculation (min): 85 min     Short Term Goals:Week 2:  OT Short Term Goal 1 (Week 2): STG=LTG due to LOS  Skilled Therapeutic Interventions/Progress Updates:    Pt seen for OT ADL bathing/dressing session. Pt sitting up in w/c upon arrival,voicing desire for toileting and shower task. VCs required to lock w/c brakes before standing. He ambulated into bathroom and completed toilet transfers with close supervision. He bathed seated on shower bench with close supervision, standing with use of grab bars to complete buttock hygiene. He returned to supine for cervical brace pads to be changed and dressed seated EOB. He ambulated throughout unit with close supervision for straight line walking, requiring min A for turns.  He completed table top task addresing pincer grasp with R UE, requiring increased time and trials to manipulate.  He then completed sitting and standing tasks utilizing [employer] addressing functional reach, standing balance, endurance, and fine motor isolation. Pt required to weight shift, reach outside, BOS, and cross midline in order to complete task- completed with CGA. Pt demonstrated slight ataxic like motions with R index finger, however, improved with trials. He tolerated ~2 minutes of standing each trial before requiring seated rest breaks. Pt ambulated back to room at end of session, left sitting in w/c with NT present.  Educated throughout session regarding pt's current need for assist, fall risk, continuum of care, and d/c planning.   Therapy Documentation Precautions:  Precautions Precautions: Fall, Cervical Required Braces or Orthoses: Cervical Brace Cervical Brace: Hard collar Restrictions Weight Bearing Restrictions: No Pain:   No/ denies pain ADL: ADL ADL Comments: see  Functional Assessment Tool  See Function Navigator for Current Functional Status.   Therapy/Group: Individual Therapy  [name], Barnes Florek C 02/20/2016, 7:12 AM

## 2016-02-21 ENCOUNTER — Inpatient Hospital Stay (HOSPITAL_COMMUNITY): Payer: Medicare Other

## 2016-02-21 ENCOUNTER — Inpatient Hospital Stay (HOSPITAL_COMMUNITY): Payer: Medicare Other | Admitting: Physical Therapy

## 2016-02-21 ENCOUNTER — Inpatient Hospital Stay (HOSPITAL_COMMUNITY): Payer: Medicare Other | Admitting: Occupational Therapy

## 2016-02-21 ENCOUNTER — Inpatient Hospital Stay (HOSPITAL_COMMUNITY): Payer: Medicare Other | Admitting: Speech Pathology

## 2016-02-21 LAB — GLUCOSE, CAPILLARY
GLUCOSE-CAPILLARY: 136 mg/dL — AB (ref 65–99)
GLUCOSE-CAPILLARY: 129 mg/dL — AB (ref 65–99)
Glucose-Capillary: 227 mg/dL — ABNORMAL HIGH (ref 65–99)
Glucose-Capillary: 244 mg/dL — ABNORMAL HIGH (ref 65–99)

## 2016-02-21 NOTE — Progress Notes (Signed)
Occupational Therapy Session Note  Patient Details  Name: Gary Craig MRN: [id number] Date of Birth: 10/04/1933  Today's Date: 02/21/2016 OT Individual Time: 1300-1400 OT Individual Time Calculation (min): 60 min     Short Term Goals: Week 2:  OT Short Term Goal 1 (Week 2): STG=LTG due to LOS  Skilled Therapeutic Interventions/Progress Updates:    Pt seen for OT session focusing on fine motor coordination with hand witting and UE strengthening. Pt asleep in chair upon arrival, easily awoken and voicing increased fatigue, however, agreeable to tx session.  In therapy gym, addressed pt's personal goal of working on hand writing. Completed task at table top level, assist provided for proper positioning of UE for increased support as pt fatigued quickly during writing tasks. Pt able to manipulate both standard pencil and pen. Coban placed around each implement for ease of use and increased stability. Then completed writing activity in standing with paper taped to wall in order to incorporate entire UE for strengthening/ stability purposes. Pt benefited from horizontal lines drawn on paper in order to give visual feedback for control. Seated rest breaks required during standing trials in order to maintain quality of hand writing. Following several VCs, pt able to self initiate rest breaks. Pt practice writing name, address, alphabet, and sentence utilizing all letters of alphabet- completed in print and cursive. He then practiced buttoning small buttons on men's dress shirt. Pt able to complete 3 buttons today, an improvement from previous sessions though he cont to require significantly increased time. Pt became tearful and saddened stating he wanted to progress further with therapies before d/c. Re-educated regarding OT/PT supervision goals and continuum of care as well as empathetic listening and support. Pt voiced feeling better following conversation with therapist. Pt ambulated back to room at  end of session with RW and supervision. He returned to supine with all needs in reach. Bed alarm on. Re-educated regarding use of call bell for assistance with mobility.   Therapy Documentation Precautions:  Precautions Precautions: Fall, Cervical Required Braces or Orthoses: Cervical Brace Cervical Brace: Hard collar Restrictions Weight Bearing Restrictions: No Pain: Pain Assessment Pain Score: 0-No pain ADL: ADL ADL Comments: see Functional Assessment Tool  See Function Navigator for Current Functional Status.   Therapy/Group: Individual Therapy  [name], Alessandria Henken C 02/21/2016, 3:04 PM

## 2016-02-21 NOTE — Progress Notes (Signed)
Occupational Therapy Note  Patient Details  Name: Gary Craig MRN: [id number] Date of Birth: [birth year]  Today's Date: 02/21/2016 OT Individual Time: 0900-1000 OT Individual Time Calculation (min): 60 min    Pt denied pain Individual therapy  Pt engaged in [facility] tasks seated edge of mat.  Pt c/o increased fatigue holding his trunk and head upright and required rest breaks laying on mat with head elevated.  Continued discharge planning and recommendation for 24 hour supervision. Pt verbalized understanding. Pt requested use of toilet and performed all tasks at supervision level.  Leotis Shames [facility] 02/21/2016, 12:17 PM

## 2016-02-21 NOTE — Progress Notes (Signed)
Physical Therapy Session Note  Patient Details  Name: Gary Craig MRN: [id number] Date of Birth: Feb 29, 1934  Today's Date: 02/21/2016 PT Individual Time: 1100-1200 PT Individual Time Calculation (min): 60 min    Short Term Goals: Week 2:  PT Short Term Goal 1 (Week 2): =LTG due to estimated LOS  Skilled Therapeutic Interventions/Progress Updates:   Pt received seated in recliner; denies pain and agreeable to treatment. Gait x100' with RW and S; remaining distance 100' to gym with no AD and min/modA. Formal proprioception/coordination/strength and light touch testing performed to increase pt's awareness of deficits and allow for continued education into how deficits impact safety and functional mobility. Also reiterated S level goals and recommendation that pt have S when returning home for first few weeks due to high fall risk and pt impulsivity. Pt with absent proprioception R hallux and ankle, fully intact at knee; impaired at L hallux, intact at L ankle/knee. Heel-to-shin impaired BLE. Quadruped alternate UE extensions for core stability and coordination. Quadruped and tall kneeling rhythmic stabilization 2x1 min in each position; cues in tall kneeling for glute activation and trunk extension. Supine PNF D1 flexion/extension 2x8 each side with minimal resistance for improved coordination. Education on bed mobility strategies to increase independence and reduce neck pain; pt performed x2 trials with S demonstrating appropriate log roll and assist with bottom UE. Returned to room with gait x150' RW and S. Remained seated in w/c at end of session, all needs in reach.   Therapy Documentation Precautions:  Precautions Precautions: Fall, Cervical Required Braces or Orthoses: Cervical Brace Cervical Brace: Hard collar Restrictions Weight Bearing Restrictions: No Pain: Pain Assessment Pain Score: 0-No pain   See Function Navigator for Current Functional Status.   Therapy/Group: Individual  Therapy  [name] 02/21/2016, 12:59 PM

## 2016-02-21 NOTE — Progress Notes (Signed)
Speech Language Pathology Daily Session Note  Patient Details  Name: Gary Craig MRN: [id number] Date of Birth: 1933-11-10  Today's Date: 02/21/2016 SLP Individual Time: 0730-0825 SLP Individual Time Calculation (min): 55 min   Short Term Goals: Week 2: SLP Short Term Goal 1 (Week 2): Pt will consume dys 2 textures and thin liquids with mod I use of swallowing precautions and minimal overt s/s of aspiration.  SLP Short Term Goal 2 (Week 2): Pt will consume therapeutic trials of Dys. 3 textures with mod I use of swallowing precautions and minimal overt s/s of aspiration over 3 consecutive session prior to advancement.    Skilled Therapeutic Interventions: Skilled treatment session focused on dysphagia goals. Upon arrival, patient was sititing upright in the wheelchair. Patient reported he transferred himself to the chair. Therefore, SLP provided re-education to patient for the importance of calling staff for assistance to maximize his safety due to poor balance at this time. Patient verbalized understanding but will need reinforcement. Patient consumed breakfast meal of Dys. 3 textures with thin liquids with intermittent overt s/s of aspiration and was Mod I for use of swallowing compensatory strategies. Recommend patient continue current diet with possible trials of upgraded textures at next session. Patient left upright in wheelchair with all needs within reach. Continue with current plan of care.   Function:  Eating Eating   Modified Consistency Diet: Yes Eating Assist Level: Swallowing techniques: self managed           Cognition Comprehension Comprehension assist level: Follows complex conversation/direction with extra time/assistive device  Expression   Expression assist level: Expresses complex ideas: With no assist  Social Interaction Social Interaction assist level: Interacts appropriately with others - No medications needed.  Problem Solving Problem solving assist level:  Solves complex 90% of the time/cues < 10% of the time  Memory Memory assist level: Complete Independence: No helper    Pain Pain Assessment Pain Score: 0-No pain  Therapy/Group: Individual Therapy  Norvell Caswell 02/21/2016, 12:35 PM

## 2016-02-21 NOTE — Progress Notes (Signed)
[place] PHYSICAL MEDICINE & REHABILITATION     PROGRESS NOTE  Subjective/Complaints:  Up out of bed without assistance this morning. No issues otherwise.   ROS: Denies CP, SOB, N/V/D.  Objective: Vital Signs: Blood pressure (!) 108/58, pulse 71, temperature 98.4 F (36.9 C), temperature source Oral, resp. rate 16, height 5\' 8"  (1.727 m), weight 67.5 kg (148 lb 12.8 oz), SpO2 100 %. No results found. No results for input(s): WBC, HGB, HCT, PLT in the last 72 hours.  Recent Labs  02/20/16 0426  NA 133*  K 4.5  CL 102  GLUCOSE 209*  BUN 14  CREATININE 0.73  CALCIUM 8.9   CBG (last 3)   Recent Labs  02/20/16 1638 02/20/16 2019 02/21/16 0648  GLUCAP 211* 259* 136*    Wt Readings from Last 3 Encounters:  02/15/16 67.5 kg (148 lb 12.8 oz)  02/09/16 70.9 kg (156 lb 4.9 oz)  02/05/16 72.6 kg (160 lb)    Physical Exam:  BP (!) 108/58 (BP Location: Left Arm)   Pulse 71   Temp 98.4 F (36.9 C) (Oral)   Resp 16   Ht 5\' 8"  (1.727 m)   Wt 67.5 kg (148 lb 12.8 oz)   SpO2 100%   BMI 22.62 kg/m  Constitutional: He appears well-developedand well-nourished. NAD.  HENT: Normocephalic.  Right Ear: External earnormal.  Left Ear: External earnormal.  Eyes: Conjunctivaeand EOMare normal.  Neck: Cervical collar in place Cardiovascular: Normal rateand regular rhythm. no murmurs or rubs Respiratory: Effort normaland breath sounds normal. No respiratory distress. No wheezes or rhonchi GI: Soft. Bowel sounds are normal. He exhibits no distension. Non tender. Musculoskeletal: He exhibits no edemaor tenderness.  Neurological: He is alertand oriented.  Motor: RUE: 4+/5 deltoid, bicep, tricep, wrist and HI 4-4+/5.  LUE: 4/5 proximal to distal RLE 4-/5 HF, KE and 4/5 ADF/PF.  LUE: 4/5 HF, KE, 4+/5 prox to distal Impaired FMC RUE and RLE. Neuro exam stable    Skin: Skin is warmand dry.  Surgical site clean/closed Psychiatric: He has a normal mood and affect. His  behavior is generally anxious per baseline   Assessment/Plan: 1. 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 Physiatrist is providing close team supervision and 24 hour management of active medical problems listed below. Physiatrist and rehab team continue to assess barriers to discharge/monitor patient progress toward functional and medical goals.  Function:  Bathing Bathing position   Position: Shower  Bathing parts Body parts bathed by patient: Right arm, Left arm, Chest, Abdomen, Front perineal area, Right upper leg, Left upper leg, Buttocks, Right lower leg, Left lower leg Body parts bathed by helper: Back  Bathing assist Assist Level: Supervision or verbal cues      Upper Body Dressing/Undressing Upper body dressing   What is the patient wearing?: Pull over shirt/dress, Orthosis     Pull over shirt/dress - Perfomed by patient: Thread/unthread right sleeve, Thread/unthread left sleeve, Put head through opening, Pull shirt over trunk Pull over shirt/dress - Perfomed by helper: Put head through opening, Pull shirt over trunk     Orthosis activity level: Performed by helper  Upper body assist Assist Level: Set up   Set up : To obtain clothing/put away, To apply TLSO, cervical collar  Lower Body Dressing/Undressing Lower body dressing   What is the patient wearing?: Pants, Underwear, Shoes Underwear - Performed by patient: Thread/unthread right underwear leg, Pull underwear  up/down, Thread/unthread left underwear leg Underwear - Performed by helper: Thread/unthread left underwear leg Pants- Performed by patient: Thread/unthread right pants leg, Thread/unthread left pants leg, Pull pants up/down Pants- Performed by helper: Fasten/unfasten pants         Shoes - Performed by patient: Don/doff right shoe, Don/doff left shoe Shoes - Performed by helper:  Don/doff right shoe, Don/doff left shoe          Lower body assist Assist for lower body dressing: Supervision or verbal cues      Toileting Toileting   Toileting steps completed by patient: Adjust clothing prior to toileting, Performs perineal hygiene, Adjust clothing after toileting Toileting steps completed by helper: Performs perineal hygiene Toileting Assistive Devices: Grab bar or rail  Toileting assist Assist level: Supervision or verbal cues   Transfers Chair/bed transfer   Chair/bed transfer method: Ambulatory Chair/bed transfer assist level: Supervision or verbal cues Chair/bed transfer assistive device: Medical sales representative     Max distance: 150 Assist level: Touching or steadying assistance (Pt > 75%)   Wheelchair   Type: Manual Max wheelchair distance: 150 Assist Level: Supervision or verbal cues  Cognition Comprehension Comprehension assist level: Follows complex conversation/direction with extra time/assistive device  Expression Expression assist level: Expresses complex ideas: With no assist  Social Interaction Social Interaction assist level: Interacts appropriately with others - No medications needed.  Problem Solving Problem solving assist level: Solves complex problems: With extra time  Memory Memory assist level: Complete Independence: No helper     Medical Problem List and Plan: 1. 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 Status post ACDF 01/16/2016 with postoperative epidural hematoma status post exploration and fusion removal of plate and bone graft 02/08/2016 followed by posterior cervical decompression 02/08/2016. Cervical collar at all times  Cont CIR.   -making steady progress  -impulsivity still an issue 2.  DVT Prophylaxis/Anticoagulation: SCDs/mobility.   Vascular studies negative 3. Pain Management: Tylenol as needed.                        -limit pain meds due to SE 4. Diabetes mellitus. Pt admits to  eating pasta/carbs yesterday  -eating more in general now  -amaryl increased to 4mg  daily  -decadron---dc today  -SSI as we're weaning steroids    5. Neuropsych: This patient is capable of making decisions on his own behalf.  -remains impulsive (premorbid?) 6. Skin/Wound Care: wound healing nicely    7. Fluids/Electrolytes/Nutrition:   -  8. Dysphagia. Dysphagia #2  thin liquid diet                       -follow up per SLP 9. Hypertension: Improved  Lisinopril 2.5 mg daily increased to 5mg  on 8/26    10. Hypothyroidism. Continue Synthroid. 11. Hyperlipidemia. Lipitor   LOS (Days) 11 A FACE TO FACE EVALUATION WAS PERFORMED  SWARTZ,ZACHARY T 02/21/2016 8:16 AM

## 2016-02-22 ENCOUNTER — Inpatient Hospital Stay (HOSPITAL_COMMUNITY): Payer: Medicare Other | Admitting: Physical Therapy

## 2016-02-22 ENCOUNTER — Inpatient Hospital Stay (HOSPITAL_COMMUNITY): Payer: Medicare Other | Admitting: Occupational Therapy

## 2016-02-22 ENCOUNTER — Inpatient Hospital Stay (HOSPITAL_COMMUNITY): Payer: Medicare Other | Admitting: Speech Pathology

## 2016-02-22 LAB — GLUCOSE, CAPILLARY
GLUCOSE-CAPILLARY: 184 mg/dL — AB (ref 65–99)
Glucose-Capillary: 114 mg/dL — ABNORMAL HIGH (ref 65–99)
Glucose-Capillary: 148 mg/dL — ABNORMAL HIGH (ref 65–99)
Glucose-Capillary: 200 mg/dL — ABNORMAL HIGH (ref 65–99)

## 2016-02-22 NOTE — Progress Notes (Signed)
Occupational Therapy Session Note  Patient Details  Name: Gary Craig MRN: [id number] Date of Birth: [birth year]  Today's Date: 02/22/2016 OT Individual Time: 1100-1200 OT Individual Time Calculation (min): 60 min    Short Term Goals:Week 2:  OT Short Term Goal 1 (Week 2): STG=LTG due to LOS  Skilled Therapeutic Interventions/Progress Updates:    Pt seen for OT ADL bathing/dressing session. Pt sitting up in w/c upon arrival, agreeable to tx session. He ambulated throughout session with RW and supervision. He bathed seated on tub bench, standing with use of grab bars to complete buttock hygiene. Supine rest break provided while therapist changed cervical brace pads.  He dressed seated EOB with supervision. Reviewed with pt supervision level goals for d/c and plan for family to come in for caregiver training tomorrow, being scheduled by CSW.  Seated EOB, pt completed 9 hole peg test, see results below. Pt very excited about progress. Pt left seated in w/c at end of session, all needs in reach. Discussed with pt at length regarding planned community outing for tomorrow and therapy goals with outing. Pt excited to get into community.   9 Hole Peg Test: R: 2:17.19, 1:3.79, and 1:41.39 AVG:1:50.79 L: 1:19.35, 58.72 and 55.00 AVG: 64.35  Therapy Documentation Precautions:  Precautions Precautions: Fall, Cervical Required Braces or Orthoses: Cervical Brace Cervical Brace: Hard collar Restrictions Weight Bearing Restrictions: No Pain:   No/ denies pain ADL: ADL ADL Comments: see Functional Assessment Tool  See Function Navigator for Current Functional Status.   Therapy/Group: Individual Therapy  [name], Braylie Badami C 02/22/2016, 7:16 AM

## 2016-02-22 NOTE — Progress Notes (Signed)
Speech Language Pathology Daily Session Note  Patient Details  Name: Gary Craig MRN: [id number] Date of Birth: [birth year]  Today's Date: 02/22/2016 SLP Individual Time: 1255-1350 SLP Individual Time Calculation (min): 55 min   Short Term Goals: Week 2: SLP Short Term Goal 1 (Week 2): Pt will consume dys 2 textures and thin liquids with mod I use of swallowing precautions and minimal overt s/s of aspiration.  SLP Short Term Goal 2 (Week 2): Pt will consume therapeutic trials of Dys. 3 textures with mod I use of swallowing precautions and minimal overt s/s of aspiration over 3 consecutive session prior to advancement.    Skilled Therapeutic Interventions: Skilled treatment session focused on dysphagia goals. SLP facilitated session by providing skilled observation with trials of regular textures. Patient demonstrated efficient mastication with minimal overt s/s of aspiration and utilized swallowing compensatory strategies with Mod I. Patient also reported that he felt his swallowing has improved and denied a globus sensation. Recommend trial tray prior to upgrade. Patient left upright in wheelchair with all needs within reach. Continue with current plan of care.   Function:  Eating Eating   Modified Consistency Diet: No (with trials from SLP) Eating Assist Level: Swallowing techniques: self managed           Cognition Comprehension Comprehension assist level: Follows complex conversation/direction with extra time/assistive device  Expression   Expression assist level: Expresses complex ideas: With no assist  Social Interaction Social Interaction assist level: Interacts appropriately with others - No medications needed.  Problem Solving Problem solving assist level: Solves complex 90% of the time/cues < 10% of the time  Memory Memory assist level: Complete Independence: No helper    Pain No/Denies Pain   Therapy/Group: Individual Therapy  Dylon Correa, [place] 02/22/2016, 1:56  PM

## 2016-02-22 NOTE — Progress Notes (Signed)
[place] PHYSICAL MEDICINE & REHABILITATION     PROGRESS NOTE  Subjective/Complaints:  No new complaints. Happy with progress.    ROS: Denies CP, SOB, N/V/D.  Objective: Vital Signs: Blood pressure (!) 109/48, pulse 65, temperature 97.7 F (36.5 C), temperature source Oral, resp. rate 17, height 5\' 8"  (1.727 m), weight 67.5 kg (148 lb 12.8 oz), SpO2 99 %. No results found. No results for input(s): WBC, HGB, HCT, PLT in the last 72 hours.  Recent Labs  02/20/16 0426  NA 133*  K 4.5  CL 102  GLUCOSE 209*  BUN 14  CREATININE 0.73  CALCIUM 8.9   CBG (last 3)   Recent Labs  02/21/16 1644 02/21/16 2049 02/22/16 0657  GLUCAP 227* 244* 114*    Wt Readings from Last 3 Encounters:  02/15/16 67.5 kg (148 lb 12.8 oz)  02/09/16 70.9 kg (156 lb 4.9 oz)  02/05/16 72.6 kg (160 lb)    Physical Exam:  BP (!) 109/48 (BP Location: Left Arm)   Pulse 65   Temp 97.7 F (36.5 C) (Oral)   Resp 17   Ht 5\' 8"  (1.727 m)   Wt 67.5 kg (148 lb 12.8 oz)   SpO2 99%   BMI 22.62 kg/m  Constitutional: He appears well-developedand well-nourished. NAD.  HENT: Normocephalic.  Right Ear: External earnormal.  Left Ear: External earnormal.  Eyes: Conjunctivaeand EOMare normal.  Neck: Cervical collar in place Cardiovascular: Normal rateand regular rhythm. no murmurs or rubs Respiratory: Effort normaland breath sounds normal. No respiratory distress. No wheezes or rhonchi GI: Soft. Bowel sounds are normal. He exhibits no distension. Non tender. Musculoskeletal: He exhibits no edemaor tenderness.  Neurological: He is alertand oriented.  Motor: RUE: 4+/5 deltoid, bicep, tricep, wrist and HI 4-4+/5.  LUE: 4/5 proximal to distal RLE 4-/5 HF, KE and 4/5 ADF/PF.  LUE: 4/5 HF, KE, 4+/5 prox to distal Impaired FMC RUE and RLE. Neuro exam stable    Skin: Skin is warmand dry.  Surgical site healed Psychiatric: He has a normal mood and affect. His behavior is generally anxious per  baseline   Assessment/Plan: 1. 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 Physiatrist is providing close team supervision and 24 hour management of active medical problems listed below. Physiatrist and rehab team continue to assess barriers to discharge/monitor patient progress toward functional and medical goals.  Function:  Bathing Bathing position   Position: Shower  Bathing parts Body parts bathed by patient: Right arm, Left arm, Chest, Abdomen, Front perineal area, Right upper leg, Left upper leg, Buttocks, Right lower leg, Left lower leg Body parts bathed by helper: Back  Bathing assist Assist Level: Supervision or verbal cues      Upper Body Dressing/Undressing Upper body dressing   What is the patient wearing?: Pull over shirt/dress, Orthosis     Pull over shirt/dress - Perfomed by patient: Thread/unthread right sleeve, Thread/unthread left sleeve, Put head through opening, Pull shirt over trunk Pull over shirt/dress - Perfomed by helper: Put head through opening, Pull shirt over trunk     Orthosis activity level: Performed by helper  Upper body assist Assist Level: Set up   Set up : To obtain clothing/put away, To apply TLSO, cervical collar  Lower Body Dressing/Undressing Lower body dressing   What is the patient wearing?: Pants, Underwear, Shoes Underwear - Performed by patient: Thread/unthread right underwear leg, Pull underwear up/down, Thread/unthread left underwear  leg Underwear - Performed by helper: Thread/unthread left underwear leg Pants- Performed by patient: Thread/unthread right pants leg, Thread/unthread left pants leg, Pull pants up/down Pants- Performed by helper: Fasten/unfasten pants         Shoes - Performed by patient: Don/doff right shoe, Don/doff left shoe Shoes - Performed by helper: Don/doff right shoe, Don/doff left  shoe          Lower body assist Assist for lower body dressing: Supervision or verbal cues      Toileting Toileting   Toileting steps completed by patient: Adjust clothing prior to toileting, Performs perineal hygiene, Adjust clothing after toileting Toileting steps completed by helper: Performs perineal hygiene Toileting Assistive Devices: Grab bar or rail  Toileting assist Assist level: Supervision or verbal cues   Transfers Chair/bed transfer   Chair/bed transfer method: Ambulatory Chair/bed transfer assist level: Supervision or verbal cues Chair/bed transfer assistive device: Medical sales representative     Max distance: 150 Assist level: Touching or steadying assistance (Pt > 75%)   Wheelchair   Type: Manual Max wheelchair distance: 150 Assist Level: Supervision or verbal cues  Cognition Comprehension Comprehension assist level: Follows complex conversation/direction with extra time/assistive device  Expression Expression assist level: Expresses complex ideas: With no assist  Social Interaction Social Interaction assist level: Interacts appropriately with others - No medications needed.  Problem Solving Problem solving assist level: Solves complex 90% of the time/cues < 10% of the time  Memory Memory assist level: Complete Independence: No helper     Medical Problem List and Plan: 1. 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 Status post ACDF 01/16/2016 with postoperative epidural hematoma status post exploration and fusion removal of plate and bone graft 02/08/2016 followed by posterior cervical decompression 02/08/2016. Cervical collar at all times  Cont CIR.   -working toward [employer] Friday 2.  DVT Prophylaxis/Anticoagulation: SCDs/mobility.   Vascular studies negative 3. Pain Management: Tylenol as needed.                        -limit pain meds due to SE 4. Diabetes mellitus.   -sugars steadily improving  -amaryl increased to 4mg   daily  -decadron stopped  -SSI      5. Neuropsych: This patient is capable of making decisions on his own behalf.  -remains impulsive (premorbid?) 6. Skin/Wound Care: wound healing nicely    7. Fluids/Electrolytes/Nutrition:   -  8. Dysphagia. Dysphagia #2  thin liquid diet                       -follow up per SLP 9. Hypertension: Improved  Lisinopril 2.5 mg daily increased to 5mg  on 8/26    10. Hypothyroidism. Continue Synthroid. 11. Hyperlipidemia. Lipitor 12. Enterobacter UTI---bactrim   LOS (Days) 12 A FACE TO FACE EVALUATION WAS PERFORMED  Gary Craig T 02/22/2016 8:29 AM

## 2016-02-22 NOTE — Progress Notes (Signed)
Physical Therapy Session Note  Patient Details  Name: Gary Craig MRN: [id number] Date of Birth: 1933-11-30  Today's Date: 02/22/2016 PT Individual Time: 0800-0930 and 1430-1500 PT Individual Time Calculation (min): 90 min and 30 min (total 120 min)    Short Term Goals: Week 2:  PT Short Term Goal 1 (Week 2): =LTG due to estimated LOS  Skilled Therapeutic Interventions/Progress Updates:   Tx 1: Pt received seated in w/c, denies pain and agreeable to treatment. Sit <>stand to pull up pants after using restroom with NT; S for balance. Pt donned shoes with S and increased time due to decrease [place]. W/c propulsion to gym x150' modI. Side step monster walks with level 2 theraband in parallel bars; cues for eccentric control and maintaining tension on band throughout. Wall squats with stability ball; performed x6 reps before pt fatigued and felt like LEs were "giving out". Seated on stability ball performed alternating LE marching, cues for anterior pelvic tilt and stability; fatigues easily and requires several reclined rest breaks to rest. Seated on stability ball with anterior pelvic tilt, performed BUE rows with level 1 theraband for increased balance challenge and coordination. Resisted sit <>stands with level 1 theraband looped under feet and pt holding with BUEs; max verbal/tactile cues for anterior weight shift for increased ease of sit >stand, and improved eccentric control of stand>sit. Gait to return to room with RW and S x150'. Remained seated in recliner with RN present at end of session, all needs in reach.   Tx 2: Pt received seated in recliner with family present. Gait to gym with no AD and min/modA; during first several steps pt rolled L ankle and required modA to regain balance. Pt reported "My feet are asleep from sitting in that chair for so long"; discussed with pt that when he is unable to feel his feet it is very unsafe to walk and significantly increases risk of falls. Suggested to  pt that he reposition himself in his chair, allow for all feeling to return to legs before he attempts any mobility task if that occurs again, pt verbalizes understanding. Gait with small based quad cane x90', x180' with minA overall; mod cues for sequencing and technique to maintain cane even with RLE and close to body. Gait with quad cane navigating/weaving around cones for dynamic balance and gait challenge. HR elevates quickly with short bouts of activity to approximately 120 bpm, recovers to WNL after several minute rest break. Returned to room with quad cane and minA. Remained seated in recliner at end of session, all needs in reach.   Therapy Documentation Precautions:  Precautions Precautions: Fall, Cervical Required Braces or Orthoses: Cervical Brace Cervical Brace: Hard collar Restrictions Weight Bearing Restrictions: No   See Function Navigator for Current Functional Status.   Therapy/Group: Individual Therapy  [name] 02/22/2016, 9:31 AM

## 2016-02-23 ENCOUNTER — Inpatient Hospital Stay (HOSPITAL_COMMUNITY): Payer: Medicare Other | Admitting: Speech Pathology

## 2016-02-23 ENCOUNTER — Encounter (HOSPITAL_COMMUNITY): Payer: Medicare Other | Admitting: Occupational Therapy

## 2016-02-23 ENCOUNTER — Encounter (HOSPITAL_COMMUNITY): Payer: Medicare Other

## 2016-02-23 ENCOUNTER — Ambulatory Visit (HOSPITAL_COMMUNITY): Payer: Medicare Other | Admitting: Physical Therapy

## 2016-02-23 ENCOUNTER — Inpatient Hospital Stay (HOSPITAL_COMMUNITY): Payer: Medicare Other | Admitting: *Deleted

## 2016-02-23 LAB — GLUCOSE, CAPILLARY
GLUCOSE-CAPILLARY: 123 mg/dL — AB (ref 65–99)
GLUCOSE-CAPILLARY: 175 mg/dL — AB (ref 65–99)
Glucose-Capillary: 190 mg/dL — ABNORMAL HIGH (ref 65–99)
Glucose-Capillary: 194 mg/dL — ABNORMAL HIGH (ref 65–99)

## 2016-02-23 NOTE — Discharge Summary (Signed)
Discharge summary job #[id number] 

## 2016-02-23 NOTE — Progress Notes (Signed)
Occupational Therapy Session Note  Patient Details  Name: Gary Craig MRN: [id number] Date of Birth: 1933/08/31  Today's Date: 02/23/2016 OT Concurrent Time: 1000-1130 and 1400-1430 OT Concurrent Time Calculation (min): 90 min and 30 min    Short Term Goals:Week 2:  OT Short Term Goal 1 (Week 2): STG=LTG due to LOS  Skilled Therapeutic Interventions/Progress Updates:    Session One: Pt seen for community outing co-tx with recreational therapist.  Community mobility completed with overall supervision using RW. Pt able to initiate rest breaks mod I, however, required VC throughout for safety awareness and RW management. All therapy outing goals met. See shadow sheet for goal details. Pt's daughter attended outing as well, education completed throughout regarding pt's current level of assist, deficits and functional implications, energy conservation, and d/Craig planning.   Session Two: Pt seen for OT session focusing on family education. Pt in w/Craig upon arrival, pt's wife, 2 daughters and son-in-law present for session. Pt voicing increased fatigue this afternoon, wishing to utilize w/Craig throughout session.  In ADL apartment, pt completed simulated tub/shower transfer utilizing tub bench, completed with supervision. Educated pt's family regarding proper set-up of tub bench and AE for bathroom use including grab bars and and hand held shower head.  With pt in supine, educated and demonstrated regarding care and process for changing cervical brace pads. Pt's wife return demonstrated understanding.  Pt's family reports all questions answered and ready for planned d/Craig home tomorrow. Pt returned with family to room.    Therapy Documentation Precautions:  Precautions Precautions: Fall, Cervical Required Braces or Orthoses: Cervical Brace Cervical Brace: Hard collar Restrictions Weight Bearing Restrictions: No ADL: ADL ADL Comments: see Functional Assessment Tool  See Function Navigator for  Current Functional Status.   Therapy/Group: Individual Therapy  Gary Craig 02/23/2016, 7:07 AM

## 2016-02-23 NOTE — Discharge Summary (Signed)
NAMEARKEL, CANCELLIERE NO.:  [id number]  MEDICAL RECORD NO.:  [id number]  LOCATION:  4W07C                        FACILITY:  [facility]  PHYSICIAN:  Meredith Staggers, M.D.DATE OF BIRTH:  28-Oct-1933  DATE OF ADMISSION:  02/10/2016 DATE OF DISCHARGE:  02/24/2016                              DISCHARGE SUMMARY   DISCHARGE DIAGNOSES: 1. 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 2. SCDs for deep venous thrombosis prophylaxis. 3. Pain management. 4. Diabetes mellitus with peripheral neuropathy. 5. Dysphagia. 6. Hypertension. 7. Hypothyroidism. 8. Hyperlipidemia. 9. Enterobacter urinary tract infection.  HISTORY OF PRESENT ILLNESS:  This is a 82 year old right-handed male with history of diabetes mellitus; CAD with CABG; CVA, maintained on Plavix.  The patient lives with wife, two-level home, bedroom on main floor, three steps to entry.  Independent prior to admission and active. Presented on February 05, 2016, with history of ACDF, January 16, 2016, for cervical spondylitic myelopathy from cervical stenosis by Dr. Kary Kos, discharged to home January 16, 2016.  Developed neck pain and numbness, sensation in the hands with progressive quadriparesis.  MRI of the cervical spine showed abnormal fluid collection projecting dorsally from C4-5 interspace.  Mild peripheral enhancement, mild cord flattening and effacement of both C5 nerve roots.  Mild edema in the C4 and C5 vertebral bodies.  Underwent exploration of C4-5 fusion and removal of the plate and bone graft.  Evacuation of epidural hematoma.  Insertion of a new allograft, plate, February 06, 2016, per Dr. [name], followed by posterior cervical decompression on February 08, 2016, per Dr. [name]. [facility] course, pain management.  Cervical collar applied.  Decadron protocol.  Maintained on a dysphagia 1 thin liquid diet.  Physical and occupational therapy ongoing.  The patient was admitted for comprehensive rehab  program.  PAST MEDICAL HISTORY:  See discharge diagnoses.  SOCIAL HISTORY:  Lives with wife, independent prior to ACDF.  FUNCTIONAL HISTORY:  Upon admission to [place] was moderate assist, 50 feet with a walker; minimal assist, sit to stand; min-to-mod assist, activities of daily living.  PHYSICAL EXAMINATION:  VITAL SIGNS:  Blood pressure 142/81, pulse 62, temperature 98, respirations 18. GENERAL:  This was an alert male, oriented to person, place, and time. Cervical collar in place. LUNGS:  Clear to auscultation without wheeze. CARDIAC:  Regular rhythm, no murmur. ABDOMEN:  Soft, nontender.  Good bowel sounds. NEUROLOGIC:  Motor, right upper extremity, 4/5, deltoid, biceps, triceps, wrist; right lower extremity, 3+ to 4-, hip flexors, knee extension, 3+ ankle dorsiflexion, plantarflexion.  REHABILITATION HOSPITAL COURSE:  The patient was admitted to Inpatient Rehab Services with therapies initiated on a 3-hour daily basis, consisting of physical therapy, occupational therapy, speech therapy and rehabilitation nursing.  The following issues were addressed during the patient's rehabilitation stay.  Pertaining to Mr. Szafran tetraplegia secondary to cervical spondylitic myelopathy, he had undergone ACDF, postoperative epidural hematoma with evacuation, bone graft February 08, 2016, followed by posterior cervical decompression.  Cervical collar at all times.  He would follow up with Dr. Kary Kos.  The patient had been on Plavix for CVA, CAD prior to hospital admission.  This was held by Neurosurgery and would follow up at their discretion  on resuming Plavix therapy.  SCDs for DVT prophylaxis.  Venous Doppler studies negative. Pain management with the use of Tylenol as well as Flexeril.  Diabetes mellitus, well controlled on low-dose Amaryl.  Monitored closely, his Decadron protocol was completed.  He was completing a course of Bactrim for UTI.  Remained afebrile.  Diet was  advanced to mechanical soft.  No signs of aspiration.  No bowel or bladder disturbances.  The patient received weekly collaborative interdisciplinary team conferences to discuss estimated length of stay, family teaching, any barriers to discharge.  Sit-to-stand to pull up his pants.  Supervision for balance, donn issues with supervision, some increased time to complete activity. Propelled his wheelchair, modified independent.  Ambulates to his room, rolling walker, supervision, 150 feet.  Sidesteps with minimal assistance.  Activities of daily living and homemaking.  He could ambulate rolling walker, supervision to the bathroom.  He bathes, seated in tub bench, standing to use grab bars.  Needing some assist for fine motor skills.  Full family teaching was completed and plan discharge to home.  DISCHARGE MEDICATIONS: 1. Lipitor 40 mg p.o. daily. 2. Flexeril 10 mg t.i.d. as needed muscle spasms. 3. Pepcid 40 mg p.o. b.i.d. 4. Synthroid 112 mcg p.o. daily. 5. Lisinopril 5 mg p.o. daily. 6. Amaryl 4 mg p.o. daily.   DIET:  Diabetic diet.  FOLLOWUP:  The patient would follow up with Dr. Netty Starring, medical management; Dr. Kary Kos, Neurosurgery, call for appointment, February 28, 2016; Dr. Alger Simons, as needed.  SPECIAL INSTRUCTIONS:  No Plavix until followup with Neurosurgery.     Lauraine Rinne, P.A.   ______________________________ Meredith Staggers, M.D.    DA/MEDQ  D:  02/23/2016  T:  02/23/2016  Job:  [id number]:[id number]  cc:   Kary Kos, M.D. Dion Body, MD

## 2016-02-23 NOTE — Progress Notes (Signed)
Physical Therapy Discharge Summary  Patient Details  Name: Gary Craig MRN: [id number] Date of Birth: [birth year]  Today's Date: 02/23/2016 PT Individual Time: 1300-1400 PT Individual Time Calculation (min): 60 min     Patient has met 6 of 7 long term goals due to improved activity tolerance, improved balance, improved postural control, increased strength, ability to compensate for deficits, functional use of  right upper extremity, right lower extremity, left upper extremity and left lower extremity, improved attention, improved awareness and improved coordination.  Patient to discharge at an ambulatory level Supervision.   Patient's care partner is independent to provide the necessary physical and cognitive assistance at discharge.  Reasons goals not met: Transfer goal of modI unmet; pt has performed modI despite education regarding safety and recommendation for family/staff to be present. Continue to recommend S at discharge due to ataxia and high risk for falls.  Recommendation:  Patient will benefit from ongoing skilled PT services in outpatient setting to continue to advance safe functional mobility, address ongoing impairments in strength, coordination, balance, activity tolerance, and minimize fall risk.  Equipment: RW  Reasons for discharge: treatment goals met and discharge from hospital  Patient/family agrees with progress made and goals achieved: Yes  PT Discharge Precautions/RestrictionsPrecautions Precautions: Fall;Cervical Required Braces or Orthoses: Cervical Brace Cervical Brace: Hard collar Restrictions Weight Bearing Restrictions: No Pain  Denies pain Vision/Perception    WFL Cognition Overall Cognitive Status: Within Functional Limits for tasks assessed Arousal/Alertness: Awake/alert Orientation Level: Oriented X4 Attention: Sustained;Divided Sustained Attention: Impaired Sustained Attention Impairment: Functional complex;Verbal complex;Verbal  basic;Functional basic Selective Attention: Appears intact Divided Attention: Impaired Divided Attention Impairment: Verbal basic;Functional basic;Verbal complex;Functional complex Memory: Appears intact Awareness: Appears intact Problem Solving: Appears intact Behaviors: Restless;Impulsive Safety/Judgment: Appears intact Sensation Sensation Light Touch: Impaired Detail Light Touch Impaired Details: Impaired RLE;Impaired LLE Proprioception: Impaired Detail Proprioception Impaired Details: Impaired RLE;Impaired LLE (absent RLE hallux and ankle, intact at knee; absent LLE hallux, intact knee) Coordination Gross Motor Movements are Fluid and Coordinated: No Coordination and Movement Description: ataxic Heel Shin Test: Impaired BLE, R worse than L Motor  Motor Motor: Ataxia Motor - Discharge Observations: improving ataxia  Mobility Bed Mobility Bed Mobility: Supine to Sit;Sit to Supine Supine to Sit: 6: Modified independent (Device/Increase time) Sit to Supine: 6: Modified independent (Device/Increase time) Transfers Transfers: Yes Sit to Stand: 5: Supervision Sit to Stand Details: Verbal cues for technique;Verbal cues for precautions/safety Stand to Sit: 5: Supervision Stand to Sit Details (indicate cue type and reason): Verbal cues for precautions/safety;Verbal cues for technique Stand Pivot Transfers: 5: Supervision Stand Pivot Transfer Details: Verbal cues for technique;Verbal cues for precautions/safety Locomotion  Ambulation Ambulation: Yes Ambulation/Gait Assistance: 5: Supervision Ambulation Distance (Feet): 200 Feet Assistive device: Rolling walker Ambulation/Gait Assistance Details: Verbal cues for gait pattern;Verbal cues for technique;Verbal cues for precautions/safety Gait Gait: Yes Gait Pattern: Impaired Gait Pattern: Right genu recurvatum;Scissoring;Ataxic;Narrow base of support;Poor foot clearance - right;Lateral hip instability Gait velocity: 2.14  ft/sec Stairs / Additional Locomotion Stairs: Yes Stairs Assistance: 5: Supervision Stairs Assistance Details: Verbal cues for precautions/safety;Verbal cues for technique Stair Management Technique: One rail Right;Step to pattern;Forwards;Sideways Number of Stairs: 12 Height of Stairs: 6 Ramp: 5: Supervision Curb: 5: Supervision Wheelchair Mobility Wheelchair Mobility: No  Trunk/Postural Assessment  Cervical Assessment Cervical Assessment: Exceptions to [facility] (cervical precautions, aspen collar) Thoracic Assessment Thoracic Assessment: Exceptions to [facility] (increased kyphosis, rounded shoulders) Lumbar Assessment Lumbar Assessment: Exceptions to [facility] (posterior pelvic tilt) Postural Control Postural Control: Deficits on evaluation (ataxic,  incoordination during righting reactions and stepping strategies)  Balance Balance Balance Assessed: Yes Standardized Balance Assessment Standardized Balance Assessment: Berg Balance Test Berg Balance Test Sit to Stand: Able to stand  independently using hands Standing Unsupported: Able to stand 2 minutes with supervision Sitting with Back Unsupported but Feet Supported on Floor or Stool: Able to sit safely and securely 2 minutes Stand to Sit: Uses backs of legs against chair to control descent Transfers: Able to transfer with verbal cueing and /or supervision Standing Unsupported with Eyes Closed: Able to stand 10 seconds with supervision Standing Ubsupported with Feet Together: Able to place feet together independently and stand for 1 minute with supervision From Standing, Reach Forward with Outstretched Arm: Reaches forward but needs supervision From Standing Position, Pick up Object from Floor: Able to pick up shoe, needs supervision From Standing Position, Turn to Look Behind Over each Shoulder: Needs supervision when turning Turn 360 Degrees: Needs close supervision or verbal cueing Standing Unsupported, Alternately Place Feet on Step/Stool:  Able to complete >2 steps/needs minimal assist Standing Unsupported, One Foot in Front: Able to take small step independently and hold 30 seconds Standing on One Leg: Tries to lift leg/unable to hold 3 seconds but remains standing independently Total Score: 30 Static Sitting Balance Static Sitting - Balance Support: Feet supported Static Sitting - Level of Assistance: 6: Modified independent (Device/Increase time) Dynamic Sitting Balance Dynamic Sitting - Balance Support: Feet supported Dynamic Sitting - Level of Assistance: 6: Modified independent (Device/Increase time) Dynamic Sitting - Balance Activities: Lateral lean/weight shifting;Forward lean/weight shifting;Reaching for objects;Reaching across midline Static Standing Balance Static Standing - Balance Support: During functional activity Static Standing - Level of Assistance: 5: Stand by assistance Dynamic Standing Balance Dynamic Standing - Balance Support: Bilateral upper extremity supported;During functional activity Dynamic Standing - Level of Assistance: 5: Stand by assistance Dynamic Standing - Balance Activities: Lateral lean/weight shifting;Forward lean/weight shifting;Reaching across midline Extremity Assessment  RUE Assessment RUE Assessment: Exceptions to [facility] RUE Strength RUE Overall Strength: Deficits (4/5 overall) LUE Assessment LUE Assessment: Exceptions to [facility] LUE Strength LUE Overall Strength: Deficits (4/5 overall) RLE Assessment RLE Assessment: Exceptions to [facility] RLE PROM (degrees) Overall PROM Right Lower Extremity: Within functional limits for tasks assessed RLE Strength RLE Overall Strength: Deficits RLE Overall Strength Comments: grossly 4-/5 throughout LLE Assessment LLE Assessment: Exceptions to WFL LLE PROM (degrees) Overall PROM Left Lower Extremity: Within functional limits for tasks assessed LLE Strength LLE Overall Strength: Deficits LLE Overall Strength Comments: grossly 4-/5 to 4/5  throughout  Skilled Therapeutic Intervention: Pt received seated in recliner with family present; denies pain and agreeable to treatment. Assessed all mobility as described above with S overall using RW and min cues for safety. Family present for family education; discussed high fall risk, home safety, home modifications, safety awareness and recommendation for 24/7. Pt remained seated in recliner at end of session with all needs in reach.   See Function Navigator for Current Functional Status.  Benjiman Core Tygielski 02/23/2016, 2:45 PM

## 2016-02-23 NOTE — Patient Care Conference (Signed)
Inpatient RehabilitationTeam Conference and Plan of Care Update Date: 02/21/2016   Time: 2:00 PM    Patient Name: Gary Craig      Medical Record Number: [id number]  Date of Birth: Nov 16, 1933 Sex: Male         Room/Bed: 4W07C/4W07C-01 Payor Info: Payor: MEDICARE / Plan: MEDICARE PART A AND B / Product Type: *No Product type* /    Admitting Diagnosis: Spinal Sgtenosis  Admit Date/Time:  02/10/2016  3:40 PM Admission Comments: No comment available   Primary Diagnosis:  Spondylosis, cervical, with myelopathy Principal Problem: Spondylosis, cervical, with myelopathy  Patient Active Problem List   Diagnosis Date Noted  . Essential hypertension 02/10/2016  . Spinal stenosis, cervical region 02/08/2016  . Surgery, other elective   . Spondylosis, cervical, with myelopathy   . Diabetes mellitus type 2 in nonobese (HCC)   . Coronary artery disease involving native coronary artery of native heart without angina pectoris   . History of CVA (cerebrovascular accident)   . Post-operative pain   . Dysphagia   . Leukocytosis   . Tachypnea   . Quadriparesis ([place]) 02/05/2016  . Myelopathy, spondylogenic, cervical 01/16/2016  . CVA (cerebral infarction) 05/31/2015    Expected Discharge Date: Expected Discharge Date: 02/24/16  Team Members Present: Physician leading conference: Dr. Alger Simons Social Worker Present: Alfonse Alpers, LCSW Nurse Present: Heather Roberts, RN PT Present: Kem Parkinson, PT OT Present: Napoleon Form, OT SLP Present: Weston Anna, SLP PPS Coordinator present : Daiva Nakayama, RN, CRRN     Current Status/Progress Goal Weekly Team Focus  Medical   improving balance, still impulsive. sugars remain high but weaning off steroids  improve glycemic control, weaning steroids  impulse control, sugar mgt   Bowel/Bladder   Continent B/B LBM 9/4  Remain continent of bowel/bladder  Offer PRN laxatives as appropriate   Swallow/Nutrition/ Hydration   Dys. 3 textures with  thin liquids, intermittent supervision   Mod I   Tolerance of current diet, increased use of strategies and possible trials of upgraded textures    ADL's   Supervision overall, occasional steadying assist.  Supervision- mod I overall  Family training, d/c planning, safety awareness. activity tolerance   Mobility   modI bed mobility, min guard transfers, S gait with RW, min/modA gait no AD  modI bed mobility and transfers, S gait in home and community, stairs  coordination, NMR, dynamic standing balance, ongoing education on deficits and safety awareness   Communication             Safety/Cognition/ Behavioral Observations  Calls appropriately with no assist   Continue with current routine  Keep belongings and call bell within reach   Pain   Patient denies  <3  assess and treat pain q shift and PRN   Skin   Surgical incision to anterior/posterior neck OTA   No breakdown or infection with min assist  assess skin q shift and PRN    Rehab Goals Patient on target to meet rehab goals: Yes Rehab Goals Revised: none *See Care Plan and progress notes for long and short-term goals.  Barriers to Discharge: impulsivity, balance/safety    Possible Resolutions to Barriers:  continue education, supervision at discharge    Discharge Planning/Teaching Needs:  Pt plans to return to his home at d/c with his wife to provide supervision as needed.  Family is attending family education on 02-23-16.   Team Discussion:  Per MD, pt is improving neurologically and he is just regulating pt's blood  sugars as he comes off steroids.  Pt is at supervision level with RW and is a little impulsive and unaware.  Pt is on D3 diet with thin liquids and SLP to try trial of regular textures.  SLP still recommends pt take he medications whole in puree.  OT also reinforced meds in puree with pt.  Doing well with ADLs.  Revisions to Treatment Plan:  none   Continued Need for Acute Rehabilitation Level of Care: The patient  requires daily medical management by a physician with specialized training in physical medicine and rehabilitation for the following conditions: Daily direction of a multidisciplinary physical rehabilitation program to ensure safe treatment while eliciting the highest outcome that is of practical value to the patient.: Yes Daily medical management of patient stability for increased activity during participation in an intensive rehabilitation regime.: Yes Daily analysis of laboratory values and/or radiology reports with any subsequent need for medication adjustment of medical intervention for : Post surgical problems;Neurological problems  Karter Hellmer, Silvestre Mesi 02/23/2016, 10:19 PM

## 2016-02-23 NOTE — Progress Notes (Signed)
Speech Language Pathology Daily Session Note  Patient Details  Name: SAGEN AREOLA MRN: [id number] Date of Birth: 1933-10-15  Today's Date: 02/23/2016 SLP Individual Time: 1135-1200 SLP Individual Time Calculation (min): 25 min   Short Term Goals: Week 2: SLP Short Term Goal 1 (Week 2): Pt will consume dys 2 textures and thin liquids with mod I use of swallowing precautions and minimal overt s/s of aspiration.  SLP Short Term Goal 2 (Week 2): Pt will consume therapeutic trials of Dys. 3 textures with mod I use of swallowing precautions and minimal overt s/s of aspiration over 3 consecutive session prior to advancement.    Skilled Therapeutic Interventions: Skilled treatment session focused on dysphagia goals. SLP facilitated session by providing skilled observation with lunch meal of regular textures. Patient demonstrated increased overt s/s of aspiration with regular textures with patient reports of an increased globus sensation, therefore, recommend patient continue current diet of Dys. 3 textures to maximize swallowing safety. Patient and his daughter both verbalized understanding and agreement. Patient left upright in wheelchair with daughter. Continue with current plan of care.   Function:  Eating Eating   Modified Consistency Diet: No Eating Assist Level: Supervision or verbal cues           Cognition Comprehension Comprehension assist level: Follows complex conversation/direction with extra time/assistive device  Expression   Expression assist level: Expresses complex ideas: With no assist  Social Interaction Social Interaction assist level: Interacts appropriately with others - No medications needed.  Problem Solving Problem solving assist level: Solves complex 90% of the time/cues < 10% of the time  Memory Memory assist level: Complete Independence: No helper    Pain No/Denies Pain   Therapy/Group: Individual Therapy  Latesa Fratto 02/23/2016, 12:15 PM

## 2016-02-23 NOTE — Progress Notes (Signed)
Occupational Therapy Discharge Summary  Patient Details  Name: Gary Craig MRN: [id number] Date of Birth: September 18, 1933   Patient has met 11 of 11 long term goals due to improved activity tolerance, improved balance, postural control, functional use of  RIGHT upper and LEFT upper extremity and improved coordination.  Patient to discharge at overall Supervision level.  Patient's care partner is independent to provide the necessary physical assistance at discharge.    Extensive education and demonstration provided to patient's family/ caregivers. They all demonstrated and voiced ability and willingness to provide needed assist. Pt requires VCs for safety as he is very impulsive with decreased awareness of deficits and poor safety awareness. Pt's family educated regarding pt's needs for safety awareness and cues.   Reasons goals not met: Pt's transfer goals adequate for d/c at mod I level as pt has been completing toileting tasks and transfers mod I in his room despite recommendation and education for supervision. Cont to recommend supervision for transfers and dynamic standing tasks.   Recommendation:  Patient will benefit from ongoing skilled OT services in home health setting to continue to advance functional skills in the area of BADL and iADL.  Equipment: Tub transfer bench and 3-1 BSC  Reasons for discharge: treatment goals met and discharge from hospital  Patient/family agrees with progress made and goals achieved: Yes  OT Discharge Precautions/Restrictions  Precautions Precautions: Fall;Cervical Required Braces or Orthoses: Cervical Brace Cervical Brace: Hard collar Restrictions Weight Bearing Restrictions: No ADL ADL ADL Comments: see Functional Assessment Tool Vision/Perception  Vision- History Baseline Vision/History: Wears glasses Wears Glasses: At all times Patient Visual Report: No change from baseline  Cognition Overall Cognitive Status: Within Functional Limits  for tasks assessed Arousal/Alertness: Awake/alert Orientation Level: Oriented X4 Attention: Sustained;Divided Sustained Attention: Impaired Sustained Attention Impairment: Functional complex;Verbal complex;Verbal basic;Functional basic Selective Attention: Appears intact Divided Attention: Impaired Divided Attention Impairment: Verbal basic;Functional basic;Verbal complex;Functional complex Memory: Appears intact Awareness: Appears intact Problem Solving: Appears intact Behaviors: Impulsive Safety/Judgment: Appears intact Comments: Impulsive with decreased awareness of deficits. Likely baseline behavior Sensation Sensation Light Touch: Impaired Detail Light Touch Impaired Details: Impaired RLE;Impaired LLE;Impaired RUE;Impaired LUE Proprioception: Impaired Detail Proprioception Impaired Details: Impaired RLE;Impaired LLE Additional Comments: Reports tingling/ numbness in B hands Coordination Gross Motor Movements are Fluid and Coordinated: No Fine Motor Movements are Fluid and Coordinated: No Coordination and Movement Description: ataxic Heel Shin Test: Impaired BLE, R worse than L 9 Hole Peg Test: R: 2:17.19, 1:3.79, and 1:41.39 AVG:1:50.79    L:1:19.35, 58.72 and 55.00 AVG: 64.35 Motor  Motor Motor: Ataxia Motor - Discharge Observations: improving ataxia Mobility  Bed Mobility Bed Mobility: Supine to Sit;Sit to Supine Supine to Sit: 6: Modified independent (Device/Increase time) Sit to Supine: 6: Modified independent (Device/Increase time) Transfers Sit to Stand: 5: Supervision Sit to Stand Details: Verbal cues for technique;Verbal cues for precautions/safety Stand to Sit: 5: Supervision Stand to Sit Details (indicate cue type and reason): Verbal cues for precautions/safety;Verbal cues for technique  Trunk/Postural Assessment  Cervical Assessment Cervical Assessment: Exceptions to [facility] (Cervical; hard collar at all times) Thoracic Assessment Thoracic Assessment:  Exceptions to [facility] (Kyphotic; decreased trunk control/ strength due to SCI) Lumbar Assessment Lumbar Assessment: Exceptions to [facility] (Posterior pelvic tilt) Postural Control Postural Control: Deficits on evaluation  Balance Balance Balance Assessed: Yes Standardized Balance Assessment Standardized Balance Assessment: Berg Balance Test Berg Balance Test Sit to Stand: Able to stand  independently using hands Standing Unsupported: Able to stand 2 minutes with supervision Sitting with  Back Unsupported but Feet Supported on Floor or Stool: Able to sit safely and securely 2 minutes Stand to Sit: Uses backs of legs against chair to control descent Transfers: Able to transfer with verbal cueing and /or supervision Standing Unsupported with Eyes Closed: Able to stand 10 seconds with supervision Standing Ubsupported with Feet Together: Able to place feet together independently and stand for 1 minute with supervision From Standing, Reach Forward with Outstretched Arm: Reaches forward but needs supervision From Standing Position, Pick up Object from Floor: Able to pick up shoe, needs supervision From Standing Position, Turn to Look Behind Over each Shoulder: Needs supervision when turning Turn 360 Degrees: Needs close supervision or verbal cueing Standing Unsupported, Alternately Place Feet on Step/Stool: Able to complete >2 steps/needs minimal assist Standing Unsupported, One Foot in Front: Able to take small step independently and hold 30 seconds Standing on One Leg: Tries to lift leg/unable to hold 3 seconds but remains standing independently Total Score: 30 Static Sitting Balance Static Sitting - Balance Support: Feet supported Static Sitting - Level of Assistance: 6: Modified independent (Device/Increase time) Dynamic Sitting Balance Dynamic Sitting - Balance Support: Feet supported Dynamic Sitting - Level of Assistance: 6: Modified independent (Device/Increase time) Dynamic Sitting - Balance  Activities: Lateral lean/weight shifting;Forward lean/weight shifting;Reaching for objects;Reaching across midline Static Standing Balance Static Standing - Balance Support: During functional activity;Right upper extremity supported;Left upper extremity supported Static Standing - Level of Assistance: 5: Stand by assistance Dynamic Standing Balance Dynamic Standing - Balance Support: Right upper extremity supported;Left upper extremity supported;During functional activity Dynamic Standing - Level of Assistance: 5: Stand by assistance Dynamic Standing - Balance Activities: Lateral lean/weight shifting;Forward lean/weight shifting;Reaching across midline Extremity/Trunk Assessment RUE Assessment RUE Assessment: Exceptions to [facility] RUE Strength RUE Overall Strength: Deficits (4/5 overall) LUE Assessment LUE Assessment: Exceptions to [facility] LUE Strength LUE Overall Strength: Deficits (4/5 overall)   See Function Navigator for Current Functional Status.  Kamel Rumpf, Makaylah Oddo C 02/23/2016, 3:43 PM

## 2016-02-23 NOTE — Progress Notes (Signed)
[place] PHYSICAL MEDICINE & REHABILITATION     PROGRESS NOTE  Subjective/Complaints:  Feeling well. Anxious about going home    ROS: Denies CP, SOB, N/V/D.  Objective: Vital Signs: Blood pressure 118/65, pulse 75, temperature 98.6 F (37 C), temperature source Oral, resp. rate 16, height 5\' 8"  (1.727 m), weight 67.5 kg (148 lb 12.8 oz), SpO2 100 %. No results found. No results for input(s): WBC, HGB, HCT, PLT in the last 72 hours. No results for input(s): NA, K, CL, GLUCOSE, BUN, CREATININE, CALCIUM in the last 72 hours.  Invalid input(s): CO CBG (last 3)   Recent Labs  02/22/16 1628 02/22/16 2052 02/23/16 0631  GLUCAP 200* 184* 123*    Wt Readings from Last 3 Encounters:  02/15/16 67.5 kg (148 lb 12.8 oz)  02/09/16 70.9 kg (156 lb 4.9 oz)  02/05/16 72.6 kg (160 lb)    Physical Exam:  BP 118/65 (BP Location: Left Arm)   Pulse 75   Temp 98.6 F (37 C) (Oral)   Resp 16   Ht 5\' 8"  (1.727 m)   Wt 67.5 kg (148 lb 12.8 oz)   SpO2 100%   BMI 22.62 kg/m  Constitutional: He appears well-developedand well-nourished. NAD.  HENT: Normocephalic.  Right Ear: External earnormal.  Left Ear: External earnormal.  Eyes: Conjunctivaeand EOMare normal.  Neck: Cervical collar in place Cardiovascular: Normal rateand regular rhythm. no murmurs or rubs Respiratory: Effort normaland breath sounds normal. No respiratory distress. No wheezes or rhonchi GI: Soft. Bowel sounds are normal. He exhibits no distension. Non tender. Musculoskeletal: He exhibits no edemaor tenderness.  Neurological: He is alertand oriented.  Motor: RUE: 4+/5 deltoid, bicep, tricep, wrist and HI 4-4+/5.  LUE: 4/5 proximal to distal RLE 4-/5 HF, KE and 4/5 ADF/PF.  LUE: 4/5 HF, KE, 4+/5 prox to distal Impaired FMC RUE and RLE. Neuro exam stable    Skin: Skin is warmand dry.  Surgical site healed Psychiatric: He has a normal mood and affect. His behavior is generally anxious per  baseline   Assessment/Plan: 1. 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 Physiatrist is providing close team supervision and 24 hour management of active medical problems listed below. Physiatrist and rehab team continue to assess barriers to discharge/monitor patient progress toward functional and medical goals.  Function:  Bathing Bathing position   Position: Shower  Bathing parts Body parts bathed by patient: Right arm, Left arm, Chest, Abdomen, Front perineal area, Right upper leg, Left upper leg, Buttocks, Right lower leg, Left lower leg Body parts bathed by helper: Back  Bathing assist Assist Level: Touching or steadying assistance(Pt > 75%)      Upper Body Dressing/Undressing Upper body dressing   What is the patient wearing?: Pull over shirt/dress, Orthosis     Pull over shirt/dress - Perfomed by patient: Thread/unthread right sleeve, Thread/unthread left sleeve, Put head through opening, Pull shirt over trunk Pull over shirt/dress - Perfomed by helper: Put head through opening, Pull shirt over trunk     Orthosis activity level: Performed by helper  Upper body assist Assist Level: Set up   Set up : To obtain clothing/put away, To apply TLSO, cervical collar  Lower Body Dressing/Undressing Lower body dressing   What is the patient wearing?: Pants, Underwear, Shoes Underwear - Performed by patient: Thread/unthread right underwear leg, Pull underwear up/down, Thread/unthread left underwear leg Underwear - Performed by helper: Thread/unthread left underwear  leg Pants- Performed by patient: Thread/unthread right pants leg, Thread/unthread left pants leg, Pull pants up/down, Fasten/unfasten pants Pants- Performed by helper: Fasten/unfasten pants         Shoes - Performed by patient: Don/doff right shoe, Don/doff left shoe Shoes - Performed by  helper: Don/doff right shoe, Don/doff left shoe          Lower body assist Assist for lower body dressing: Supervision or verbal cues      Toileting Toileting   Toileting steps completed by patient: Adjust clothing prior to toileting, Performs perineal hygiene, Adjust clothing after toileting Toileting steps completed by helper: Performs perineal hygiene Toileting Assistive Devices: Grab bar or rail  Toileting assist Assist level: Supervision or verbal cues   Transfers Chair/bed transfer   Chair/bed transfer method: Ambulatory Chair/bed transfer assist level: Supervision or verbal cues Chair/bed transfer assistive device: Medical sales representative     Max distance: 180 Assist level: Touching or steadying assistance (Pt > 75%)   Wheelchair   Type: Manual Max wheelchair distance: 150 Assist Level: Supervision or verbal cues  Cognition Comprehension Comprehension assist level: Follows complex conversation/direction with extra time/assistive device  Expression Expression assist level: Expresses complex ideas: With no assist  Social Interaction Social Interaction assist level: Interacts appropriately with others - No medications needed.  Problem Solving Problem solving assist level: Solves complex 90% of the time/cues < 10% of the time  Memory Memory assist level: Complete Independence: No helper     Medical Problem List and Plan: 1. 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 Status post ACDF 01/16/2016 with postoperative epidural hematoma status post exploration and fusion removal of plate and bone graft 02/08/2016 followed by posterior cervical decompression 02/08/2016. Cervical collar at all times  Cont CIR.   -working toward [employer] Friday 2.  DVT Prophylaxis/Anticoagulation: SCDs/mobility.   Vascular studies negative 3. Pain Management: Tylenol as needed.                        -limit pain meds due to SE 4. Diabetes mellitus.   -sugars steadily  improving  -amaryl increased to 4mg  daily  -decadron stopped  -SSI     -needs to be sensible about diet   5. Neuropsych: This patient is capable of making decisions on his own behalf.  -remains impulsive (premorbid?) 6. Skin/Wound Care: wound healing nicely    7. Fluids/Electrolytes/Nutrition:   -  8. Dysphagia. Dysphagia #2  thin liquid diet                       -follow up per SLP 9. Hypertension: Improved  Lisinopril  5mg       10. Hypothyroidism. Continue Synthroid. 11. Hyperlipidemia. Lipitor 12. Enterobacter UTI---bactrim   LOS (Days) 13 A FACE TO FACE EVALUATION WAS PERFORMED  SWARTZ,ZACHARY T 02/23/2016 8:20 AM

## 2016-02-23 NOTE — Progress Notes (Addendum)
Social Work Patient ID: Gary Craig, male   DOB: [birth year], 82 y.o.   MRN: [id number]   CSW called pt's wife on 02-22-16 to give her team conference discussion update.  Pt to go on outing, so family education will have to occur in the afternoon from 1-3.  She stated that was fine.  Pt's dtr to meet pt at grocery store for outing in the morning of 02-23-16.  CSW saw pt's wife, 2 dtrs, and son-in-law during family education.  Pt's out[facility] app[facility].  DME ordered through [facility] and was due to arrive during family education so that family could take it home ahead of pt's return home.  Gave family map of [place] and address.  Discussed and finalized d/c plans.  PCP appt made.  No other needs/concerns/questions, but CSW's co-workers are available should needs arise, as this CSW will be away from the hospital on 02-24-16.

## 2016-02-23 NOTE — Progress Notes (Signed)
Social Work Patient ID: Gary Craig, male   DOB: 09-15-1933, 82 y.o.   MRN: [id number]   Gary Child, LCSW Social Worker Signed   Patient Care Conference Date of Service: 02/23/2016  1:30 PM      Hide copied text Hover for attribution information Inpatient RehabilitationTeam Conference and Plan of Care Update Date: 02/21/2016   Time: 2:00 PM      Patient Name: Gary Craig      Medical Record Number: [id number]  Date of Birth: 03-28-1934 Sex: Male         Room/Bed: 4W07C/4W07C-01 Payor Info: Payor: MEDICARE / Plan: MEDICARE PART A AND B / Product Type: *No Product type* /     Admitting Diagnosis: Spinal Sgtenosis  Admit Date/Time:  02/10/2016  3:40 PM Admission Comments: No comment available    Primary Diagnosis:  Spondylosis, cervical, with myelopathy Principal Problem: Spondylosis, cervical, with myelopathy       Patient Active Problem List    Diagnosis Date Noted  . Essential hypertension 02/10/2016  . Spinal stenosis, cervical region 02/08/2016  . Surgery, other elective    . Spondylosis, cervical, with myelopathy    . Diabetes mellitus type 2 in nonobese (HCC)    . Coronary artery disease involving native coronary artery of native heart without angina pectoris    . History of CVA (cerebrovascular accident)    . Post-operative pain    . Dysphagia    . Leukocytosis    . Tachypnea    . Quadriparesis ([place]) 02/05/2016  . Myelopathy, spondylogenic, cervical 01/16/2016  . CVA (cerebral infarction) 05/31/2015      Expected Discharge Date: Expected Discharge Date: 02/24/16   Team Members Present: Physician leading conference: Dr. Alger Craig Social Worker Present: Gary Alpers, LCSW Nurse Present: Gary Roberts, RN PT Present: Gary Craig, PT OT Present: Gary Craig, OT SLP Present: Gary Craig, SLP PPS Coordinator present : Gary Nakayama, RN, CRRN       Current Status/Progress Goal Weekly Team Focus  Medical   improving balance, still impulsive.  sugars remain high but weaning off steroids  improve glycemic control, weaning steroids  impulse control, sugar mgt   Bowel/Bladder   Continent B/B LBM 9/4  Remain continent of bowel/bladder  Offer PRN laxatives as appropriate   Swallow/Nutrition/ Hydration   Dys. 3 textures with thin liquids, intermittent supervision   Mod I   Tolerance of current diet, increased use of strategies and possible trials of upgraded textures    ADL's   Supervision overall, occasional steadying assist.  Supervision- mod I overall  Family training, d/c planning, safety awareness. activity tolerance   Mobility   modI bed mobility, min guard transfers, S gait with RW, min/modA gait no AD  modI bed mobility and transfers, S gait in home and community, stairs  coordination, NMR, dynamic standing balance, ongoing education on deficits and safety awareness   Communication             Safety/Cognition/ Behavioral Observations Calls appropriately with no assist   Continue with current routine  Keep belongings and call bell within reach   Pain   Patient denies  <3  assess and treat pain q shift and PRN   Skin   Surgical incision to anterior/posterior neck OTA   No breakdown or infection with min assist  assess skin q shift and PRN     Rehab Goals Patient on target to meet rehab goals: Yes Rehab Goals Revised: none *See Care Plan and progress  notes for long and short-term goals.   Barriers to Discharge: impulsivity, balance/safety   Possible Resolutions to Barriers:  continue education, supervision at discharge   Discharge Planning/Teaching Needs:  Pt plans to return to his home at d/c with his wife to provide supervision as needed.  Family is attending family education on 02-23-16.   Team Discussion:  Per MD, pt is improving neurologically and he is just regulating pt's blood sugars as he comes off steroids.  Pt is at supervision level with RW and is a little impulsive and unaware.  Pt is on D3 diet with thin liquids  and SLP to try trial of regular textures.  SLP still recommends pt take he medications whole in puree.  OT also reinforced meds in puree with pt.  Doing well with ADLs.  Revisions to Treatment Plan:  none    Continued Need for Acute Rehabilitation Level of Care: The patient requires daily medical management by a physician with specialized training in physical medicine and rehabilitation for the following conditions: Daily direction of a multidisciplinary physical rehabilitation program to ensure safe treatment while eliciting the highest outcome that is of practical value to the patient.: Yes Daily medical management of patient stability for increased activity during participation in an intensive rehabilitation regime.: Yes Daily analysis of laboratory values and/or radiology reports with any subsequent need for medication adjustment of medical intervention for : Post surgical problems;Neurological problems   Toccara Alford, Silvestre Mesi 02/23/2016, 10:19 PM

## 2016-02-23 NOTE — Progress Notes (Signed)
Speech Language Pathology Daily Session Note  Patient Details  Name: CHALES UMEMOTO MRN: [id number] Date of Birth: 29-Feb-1934  Today's Date: 02/23/2016 SLP Individual Time: 1435-1500 SLP Individual Time Calculation (min): 25 min   Short Term Goals: Week 2: SLP Short Term Goal 1 (Week 2): Pt will consume dys 2 textures and thin liquids with mod I use of swallowing precautions and minimal overt s/s of aspiration.  SLP Short Term Goal 2 (Week 2): Pt will consume therapeutic trials of Dys. 3 textures with mod I use of swallowing precautions and minimal overt s/s of aspiration over 3 consecutive session prior to advancement.    Skilled Therapeutic Interventions: Skilled treatment session focused on completion of patient and family education. The patient and his family (wife, 2 daughters and son-in-law) were educated in regards to his current swallowing function, diet recommendations, appropriate textures, medication administering, s/s of aspiration and s/s of aspiration PNA. All verbalized understanding of all information and handouts were given to reinforce information. Patient left upright in recliner with all needs within reach. Continue with current plan of care.   Function:  Eating Eating   Modified Consistency Diet: No Eating Assist Level: Swallowing techniques: self managed           Cognition Comprehension Comprehension assist level: Follows complex conversation/direction with extra time/assistive device  Expression   Expression assist level: Expresses complex ideas: With no assist  Social Interaction Social Interaction assist level: Interacts appropriately 90% of the time - Needs monitoring or encouragement for participation or interaction.  Problem Solving Problem solving assist level: Solves complex 90% of the time/cues < 10% of the time  Memory Memory assist level: More than reasonable amount of time    Pain No/Denies Pain   Therapy/Group: Individual Therapy  Nashaly Dorantes,  Dedria Endres 02/23/2016, 3:05 PM

## 2016-02-23 NOTE — Progress Notes (Signed)
Social Work Discharge Note  The overall goal for the admission was met for:   Discharge location: Yes - his home with his wife  Length of Stay: Yes - 14 days  Discharge activity level: Yes - supervision level  Home/community participation: Yes  Services provided included: MD, RD, PT, OT, SLP, RN, TR, Pharmacy, Neuropsych and SW  Financial Services: Medicare and Other: T[employer]are  Follow-up services arranged: Outpatient: PT/OT/ST, DME: rolling walker; tub transfer bench; bedside commode and Patient/Family request agency HH: [facility], DME: [facility]  Comments (or additional information): Pt's wife or dtrs to be with pt all the time.  Family education given with all family members.  Patient/Family verbalized understanding of follow-up arrangements: Yes  Individual responsible for coordination of the follow-up plan: pt with family support  Confirmed correct DME delivered: Trey Sailors 02/23/2016    Kord Monette, Silvestre Mesi

## 2016-02-24 LAB — GLUCOSE, CAPILLARY: Glucose-Capillary: 126 mg/dL — ABNORMAL HIGH (ref 65–99)

## 2016-02-24 MED ORDER — ATORVASTATIN CALCIUM 40 MG PO TABS: 40.0000 mg | ORAL_TABLET | Freq: Every day | ORAL | 0 refills | Status: AC

## 2016-02-24 MED ORDER — DOCUSATE SODIUM 100 MG PO CAPS: 100.0000 mg | ORAL_CAPSULE | Freq: Two times a day (BID) | ORAL | 0 refills | Status: DC

## 2016-02-24 MED ORDER — CYCLOBENZAPRINE HCL 10 MG PO TABS: 10.0000 mg | ORAL_TABLET | Freq: Three times a day (TID) | ORAL | 0 refills | Status: DC | PRN

## 2016-02-24 MED ORDER — GLIMEPIRIDE 4 MG PO TABS: 4.0000 mg | ORAL_TABLET | Freq: Every day | ORAL | 1 refills | Status: DC

## 2016-02-24 MED ORDER — LEVOTHYROXINE SODIUM 112 MCG PO TABS: 112.0000 ug | ORAL_TABLET | Freq: Every day | ORAL | 0 refills | Status: DC

## 2016-02-24 MED ORDER — LISINOPRIL 5 MG PO TABS: 5.0000 mg | ORAL_TABLET | Freq: Every day | ORAL | 1 refills | Status: DC

## 2016-02-24 MED ORDER — SULFAMETHOXAZOLE-TRIMETHOPRIM 400-80 MG PO TABS: 1.0000 | ORAL_TABLET | Freq: Two times a day (BID) | ORAL | 0 refills | Status: DC

## 2016-02-24 NOTE — Discharge Instructions (Signed)
Inpatient Rehab Discharge Instructions  Gary Craig Discharge date and time: No discharge date for patient encounter.   Activities/Precautions/ Functional Status: Activity: Cervical collar Diet: Soft diabetic diet Wound Care: keep wound clean and dry Functional status:  ___ No restrictions     ___ Walk up steps independently ___ 24/7 supervision/assistance   ___ Walk up steps with assistance ___ Intermittent supervision/assistance  ___ Bathe/dress independently ___ Walk with walker     _x__ Bathe/dress with assistance ___ Walk Independently    ___ Shower independently ___ Walk with assistance    ___ Shower with assistance ___ No alcohol     ___ Return to work/school ________  COMMUNITY REFERRALS UPON DISCHARGE:   Outpatient: PT    OT    ST  Agency:  [facility]                            [place], [place]  [address]  Phone:  [phone]  Appointment Date/Time: Monday, February 27, 2016  1 PM PT; 2 PM OT Medical Equipment/Items Ordered: rolling walker; bedside commode; tub transfer bench  Agency/Supplier: [facility]     Phone:  [phone]  Special Instructions: No driving   My questions have been answered and I understand these instructions. I will adhere to these goals and the provided educational materials after my discharge from the hospital.  Patient/Caregiver Signature _______________________________ Date __________  Clinician Signature _______________________________________ Date __________  Please bring this form and your medication list with you to all your follow-up doctor's appointments.

## 2016-02-24 NOTE — Progress Notes (Signed)
Pt discharged home with family. Discharge instructions provided by Silvestre Mesi, PA.. All questions answered, pt verbalized understanding. Educated patient on s/s of hypoglycemic and hyperglycemic. Pt stated that he was aware, therefore, refused any written material on theses topics. Escorted off unit in w/c with personal belonging by [place], [place].

## 2016-02-24 NOTE — Progress Notes (Signed)
Speech Language Pathology Discharge Summary  Patient Details  Name: Gary Craig MRN: [id number] Date of Birth: 04/04/1934  Patient has met 3 of 3 long term goals.  Patient to discharge at overall Modified Independent level.   Reasons goals not met: N/A   Clinical Impression/Discharge Summary: Patient has made excellent gains and has met 3 of 3 LTG's this reporting period due to increased swallowing function. Currently, patient is consuming Dys. 3 textures with thin liquids with minimal overt s/s of aspiration and is Mod I for use of swallowing compensatory strategies. Patient continues to demonstrate increased overt s/s of aspiration and reports of globus sensation with regular textures, suspect due edema from recent surgery. Patient and family education is complete and patient will discharge home with supervision from family. Patient would benefit from skilled outpatient SLP f/u to maximize swallowing function with least restrictive diet.   Care Partner:  Caregiver Able to Provide Assistance: Yes  Type of Caregiver Assistance: Physical  Recommendation:  Outpatient SLP;24 hour supervision/assistance  Rationale for SLP Follow Up: Maximize swallowing safety   Equipment: N/A   Reasons for discharge: Treatment goals met;Discharged from hospital   Patient/Family Agrees with Progress Made and Goals Achieved: Yes     [place], [place] 02/24/2016, 6:53 AM

## 2016-02-24 NOTE — Consult Note (Signed)
PSYCHODIAGNOSTIC EVALUATION - [facility]   Mr. Gary Craig is a 82 year old man, who was seen for an initial psychodiagnostic evaluation to assess for potential depression, anxiety, or other mental illness in the setting of cervical myelopathy and prior CVA.  According to staff members, he has displayed some symptoms of potential anxiety and there is concern that he does not have an appreciation of the extent of healing required before safely resuming activities.    During the current session, Mr. Gary Craig said that his biggest concern was the continued numbness in his hands and toes.  He also noted that it has been hard for him to take one day at a time and practice patience with his healing process.  He stated that he has been able to appreciate the physical progress that he has made, but is worried about whether or not his wife will be able to assist him at home given his continued physical limitations.  He said that he is historically a positive person and credited his upbringing and faith in God as allowing him to remain positive in the face of adversity.  He denied any history of depression or anxiety, either currently or in the past.  He stated that he has been sleeping well, has not experienced significant pain, and has resumed a normal appetite.  He noted that he is looking forward to being able to go back to church and generally get back in his normal routine.  Time was spent cautioning Mr. Gary Craig about how his desire to improve rapidly could cause him to overextend himself and possibly worsen his injuries.  He seemed to appreciate this concern as well; he commented, "I know I could climb the stairs at my house, but I won't do it for at least a few weeks."  He was encouraged to pay close attention to his therapists and doctors upon discharge to their recommendations regarding physical limitations that he should place upon himself and he was agreeable to doing  so.    IMPRESSION:  Mr. Gary Craig did not endorse symptoms consistent with underlying mood disruption.  As stated above, most of the session was spent encouraging him to be realistic about his goals and to listen to and follow the recommendations made by his care team upon discharge.  He was agreeable.  He was also provided with psychoeducation regarding risk for development of depression post-discharge and he agreed to inform his care team should he notice symptoms of depression.    DIAGNOSIS:   Cervical myelopathy  Marlane Hatcher, Psy.D.  Clinical Neuropsychologist

## 2016-02-24 NOTE — Progress Notes (Signed)
[facility]     PROGRESS NOTE  Subjective/Complaints:  Eating breakfast. No new complaints. Feels well.     ROS: Denies CP, SOB, N/V/D.  Objective: Vital Signs: Blood pressure (!) 104/59, pulse 79, temperature 98.8 F (37.1 C), temperature source Oral, resp. rate 18, height 5\' 8"  (1.727 m), weight 65.7 kg (144 lb 12.8 oz), SpO2 100 %. No results found. No results for input(s): WBC, HGB, HCT, PLT in the last 72 hours. No results for input(s): NA, K, CL, GLUCOSE, BUN, CREATININE, CALCIUM in the last 72 hours.  Invalid input(s): CO CBG (last 3)   Recent Labs  02/23/16 1652 02/23/16 2037 02/24/16 0653  GLUCAP 190* 194* 126*    Wt Readings from Last 3 Encounters:  02/23/16 65.7 kg (144 lb 12.8 oz)  02/09/16 70.9 kg (156 lb 4.9 oz)  02/05/16 72.6 kg (160 lb)    Physical Exam:  BP (!) 104/59 (BP Location: Left Arm)   Pulse 79   Temp 98.8 F (37.1 C) (Oral)   Resp 18   Ht 5\' 8"  (1.727 m)   Wt 65.7 kg (144 lb 12.8 oz)   SpO2 100%   BMI 22.02 kg/m  Constitutional: He appears well-developedand well-nourished. NAD.  HENT: Normocephalic.  Right Ear: External earnormal.  Left Ear: External earnormal.  Eyes: Conjunctivaeand EOMare normal.  Neck: Cervical collar in place Cardiovascular: Normal rateand regular rhythm. no murmurs or rubs Respiratory: Effort normaland breath sounds normal. No respiratory distress. No wheezes or rhonchi GI: Soft. Bowel sounds are normal. He exhibits no distension. Non tender. Musculoskeletal: He exhibits no edemaor tenderness.  Neurological: He is alertand oriented.  Motor: RUE: 4+/5 deltoid, bicep, tricep, wrist and HI 4-4+/5.  LUE: 4/5 proximal to distal RLE 4-/5 HF, KE and 4/5 ADF/PF.  LUE: 4/5 HF, KE, 4+/5 prox to distal Impaired FMC RUE and RLE. Neuro exam stable    Skin: Skin is warmand dry.  Surgical site healed Psychiatric: He has a normal mood and affect. His behavior is generally  anxious per baseline   Assessment/Plan: 1. 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 Physiatrist is providing close team supervision and 24 hour management of active medical problems listed below. Physiatrist and rehab team continue to assess barriers to discharge/monitor patient progress toward functional and medical goals.  Function:  Bathing Bathing position   Position: Shower  Bathing parts Body parts bathed by patient: Right arm, Left arm, Chest, Abdomen, Front perineal area, Right upper leg, Left upper leg, Buttocks, Right lower leg, Left lower leg Body parts bathed by helper: Back  Bathing assist Assist Level: Supervision or verbal cues      Upper Body Dressing/Undressing Upper body dressing   What is the patient wearing?: Pull over shirt/dress, Orthosis     Pull over shirt/dress - Perfomed by patient: Thread/unthread right sleeve, Thread/unthread left sleeve, Put head through opening, Pull shirt over trunk Pull over shirt/dress - Perfomed by helper: Put head through opening, Pull shirt over trunk     Orthosis activity level: Performed by helper  Upper body assist Assist Level: Set up   Set up : To apply TLSO, cervical collar  Lower Body Dressing/Undressing Lower body dressing   What is the patient wearing?: Pants, Underwear, Shoes Underwear - Performed by patient: Thread/unthread right underwear leg, Pull underwear up/down, Thread/unthread left underwear leg Underwear - Performed by helper: Thread/unthread left underwear leg Pants-  Performed by patient: Thread/unthread right pants leg, Thread/unthread left pants leg, Pull pants up/down, Fasten/unfasten pants Pants- Performed by helper: Fasten/unfasten pants         Shoes - Performed by patient: Don/doff right shoe, Don/doff left shoe Shoes - Performed by helper: Don/doff right shoe, Don/doff  left shoe          Lower body assist Assist for lower body dressing: Supervision or verbal cues      Toileting Toileting   Toileting steps completed by patient: Adjust clothing prior to toileting, Performs perineal hygiene, Adjust clothing after toileting Toileting steps completed by helper: Performs perineal hygiene Toileting Assistive Devices: Grab bar or rail  Toileting assist Assist level: Supervision or verbal cues   Transfers Chair/bed transfer   Chair/bed transfer method: Ambulatory Chair/bed transfer assist level: Supervision or verbal cues Chair/bed transfer assistive device: Medical sales representative     Max distance: 200 Assist level: Supervision or verbal cues   Wheelchair   Type: Manual Max wheelchair distance: 150 Assist Level: Supervision or verbal cues  Cognition Comprehension Comprehension assist level: Follows complex conversation/direction with extra time/assistive device  Expression Expression assist level: Expresses complex ideas: With no assist  Social Interaction Social Interaction assist level: Interacts appropriately 90% of the time - Needs monitoring or encouragement for participation or interaction.  Problem Solving Problem solving assist level: Solves complex 90% of the time/cues < 10% of the time  Memory Memory assist level: More than reasonable amount of time     Medical Problem List and Plan: 1. 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 Status post ACDF 01/16/2016 with postoperative epidural hematoma status post exploration and fusion removal of plate and bone graft 02/08/2016 followed by posterior cervical decompression 02/08/2016. Cervical collar at all times  Dc home today  -Patient to see me in the office for transitional care encounter in 1-2 weeks.  2.  DVT Prophylaxis/Anticoagulation: SCDs/mobility.   Vascular studies negative 3. Pain Management: Tylenol as needed.                        -limit pain meds  due to SE 4. Diabetes mellitus.   -sugars steadily improving  -amaryl increased to 4mg  daily  -decadron stopped  -SSI     -needs to be sensible about diet   5. Neuropsych: This patient is capable of making decisions on his own behalf.  -remains impulsive (premorbid?) 6. Skin/Wound Care: wound healing nicely    7. Fluids/Electrolytes/Nutrition:   -  8. Dysphagia. Dysphagia #2  thin liquid diet                       -follow up per SLP 9. Hypertension: Improved  Lisinopril  5mg       10. Hypothyroidism. Continue Synthroid. 11. Hyperlipidemia. Lipitor 12. Enterobacter UTI---bactrim--dc today   LOS (Days) 14 A FACE TO FACE EVALUATION WAS PERFORMED  SWARTZ,ZACHARY T 02/24/2016 8:49 AM

## 2016-06-04 ENCOUNTER — Encounter: Payer: Self-pay | Admitting: Neurology

## 2016-06-04 ENCOUNTER — Ambulatory Visit (INDEPENDENT_AMBULATORY_CARE_PROVIDER_SITE_OTHER): Payer: Medicare Other | Admitting: Neurology

## 2016-06-04 VITALS — BP 130/62 | HR 61 | Ht 68.0 in | Wt 158.2 lb

## 2016-06-04 DIAGNOSIS — M4712 Other spondylosis with myelopathy, cervical region: Secondary | ICD-10-CM | POA: Diagnosis not present

## 2016-06-04 DIAGNOSIS — E119 Type 2 diabetes mellitus without complications: Secondary | ICD-10-CM | POA: Diagnosis not present

## 2016-06-04 MED ORDER — DULOXETINE HCL 60 MG PO CPEP: 60.0000 mg | ORAL_CAPSULE | Freq: Every day | ORAL | 12 refills | Status: DC

## 2016-06-04 NOTE — Progress Notes (Signed)
PATIENT: Gary Craig DOB: 11/08/1933  Chief Complaint  Patient presents with  . Peripheral Neuropathy    He is here with his wife, [name].  He is here to have his progressing numbness in his bilateral hands and feet further evaluated.  He is taking gabapentin 300mg , daily but does not feel it is beneficial.  . PCP    Dion Body, MD  . Neurosurgeon    Kary Kos, MD - referring MD     HISTORICAL  Gary Craig is a 82 years old right-handed male, accompanied by his wife, referred by neurosurgeon \\Dr . [name] cram for evaluation of a ascending paresthesia, involving both feet, and hands, initial evaluation was June 04 2016,  He had a history of hypertension, hyperlipidemia, type 2 diabetes, history of coronary artery disease, CABG in June 2006  In June 2017, he noticed numbness at bilateral forearm, MRI of cervical spine showed in June 2017 showed spinal stenosis and cord compression with slightly increased cord signal at C4-5.  He had anterior C4-5 disectomy and fusion by Dr. [name] on January 16 2016, post surgically, he continued to experience bilateral arm paresthesia, about 2 weeks later, he also noticed gait abnormality, ascending paresthesia from bilateral feet to knee level, woke up February 05 2016 noticed profound gait abnormality, presented to the emergency room, repeat MRI of the cervical spine February 05 2016 showed abnormal fluid collection projecting dorsally from C4-5 interspace, status post C4-5 ACDF on January 16 2016, mild peripheral enhancement mild cord flattening and effacement of both C5 nerve roots.  He had developed quadriplegia from the cord compression, had emergency exploration of C4-5 fusion, removal of the plate and the bony craft, evacuation of epidural hematoma, insertion of a new allograft of 9 mm height, new plate,  He required few weeks rehabilitation to be able to walk again, still with mild spastic gait, continue have bilateral arm and leg  paresthesia to above elbow and above knee level, over the past few months, he had a variable degree of paresthesia, sometimes feel like it is continue ascending proximally, which has caused a lot of anxiety. He denies bowel and bladder incontinence.  He had a repeat MRI cervical spine at neurosurgical clinic on May 16 2016, anterior and posterior fusion at C4-5, resolution of the ventral extradural fluid collection, mild stenosis at C4-5.  He was given prescription of gabapentin, causing dizziness without helping his symptoms.  REVIEW OF SYSTEMS: Full 14 system review of systems performed and notable only for numbness, feeling cold, swelling legs  ALLERGIES: Allergies  Allergen Reactions  . No Known Allergies     HOME MEDICATIONS: Current Outpatient Prescriptions  Medication Sig Dispense Refill  . atorvastatin (LIPITOR) 40 MG tablet Take 1 tablet (40 mg total) by mouth at bedtime. 30 tablet 0  . clopidogrel (PLAVIX) 75 MG tablet daily.    Marland Kitchen gabapentin (NEURONTIN) 300 MG capsule daily.    Marland Kitchen glimepiride (AMARYL) 4 MG tablet Take 1 tablet (4 mg total) by mouth daily with breakfast. 30 tablet 1  . Glucosamine-Chondroitin 500-400 MG CAPS Take 1 tablet by mouth every morning.     Marland Kitchen levothyroxine (SYNTHROID, LEVOTHROID) 112 MCG tablet Take 1 tablet (112 mcg total) by mouth daily before breakfast. 30 tablet 0  . lisinopril (PRINIVIL,ZESTRIL) 5 MG tablet Take 1 tablet (5 mg total) by mouth daily. 30 tablet 1  . Multiple Vitamin (MULTIVITAMIN WITH MINERALS) TABS tablet Take 1 tablet by mouth every morning.     Marland Kitchen  omega-3 acid ethyl esters (LOVAZA) 1 g capsule Take 1 g by mouth 2 (two) times daily.    . ranitidine (ZANTAC) 75 MG tablet Take 75 mg by mouth daily as needed for heartburn.      No current facility-administered medications for this visit.     PAST MEDICAL HISTORY: Past Medical History:  Diagnosis Date  . CAD (coronary artery disease)   . Carotid stenosis   . Diabetes mellitus  without complication ([place])   . GERD (gastroesophageal reflux disease)    zantac as needed  . High cholesterol   . History of kidney stones   . Hypothyroidism   . Neuropathy (Gary Craig)   . S/P CABG (coronary artery bypass graft)   . Skin cancer   . Stroke [facility]) 2012   tia    PAST SURGICAL HISTORY: Past Surgical History:  Procedure Laterality Date  . ANTERIOR CERVICAL DECOMP/DISCECTOMY FUSION N/A 01/16/2016   Procedure: Cervical Four-Five Anterior cervical decompression/diskectomy/fusion;  Surgeon: Kary Kos, MD;  Location: [facility];  Service: Neurosurgery;  Laterality: N/A;  right side approach  . CERVICAL WOUND DEBRIDEMENT N/A 02/05/2016   Procedure: Exploration of Cervical four - five wound. Removal of cervical four - five instrumentation. Insertion of new cervical four - five instrumentation.;  Surgeon: Leeroy Cha, MD;  Location: [facility];  Service: Neurosurgery;  Laterality: N/A;  Exploration of Cervical four - five wound. Removal of cervical four - five instrumentation. Insertion of new cervical four - five instrumentatio  . CORONARY ARTERY BYPASS GRAFT  11/29/2004  . EAR BIOPSY     skin ca  . LITHOTRIPSY Right   . POSTERIOR CERVICAL FUSION/FORAMINOTOMY N/A 02/08/2016   Procedure: CERVICAL FOUR-FIVE  Decompressive Laminectomy and Lateral Mass Fixation;  Surgeon: Kary Kos, MD;  Location: [facility];  Service: Neurosurgery;  Laterality: N/A;  . TONSILLECTOMY      FAMILY HISTORY: Family History  Problem Relation Age of Onset  . Heart disease Mother   . Heart disease Father   . CAD Brother   . Heart block Brother     SOCIAL HISTORY:  Social History   Social History  . Marital status: Married    Spouse name: N/A  . Number of children: 2  . Years of education: some college   Occupational History  . Retired    Social History Main Topics  . Smoking status: Former Smoker    Packs/day: 1.00    Types: Cigarettes, Pipe, Cigars    Quit date: 01/09/1983  .  Smokeless tobacco: Never Used  . Alcohol use No  . Drug use: No  . Sexual activity: Not on file   Other Topics Concern  . Not on file   Social History Narrative   Lives at home with his wife.   Right-handed.   No caffeine use.     PHYSICAL EXAM   Vitals:   06/04/16 1356  BP: 130/62  Pulse: 61  Weight: 158 lb 4 oz (71.8 kg)  Height: 5\' 8"  (1.727 m)    Not recorded      Body mass index is 24.06 kg/m.  PHYSICAL EXAMNIATION:  Gen: NAD, conversant, well nourised, obese, well groomed                     Cardiovascular: Regular rate rhythm, no peripheral edema, warm, nontender. Eyes: Conjunctivae clear without exudates or hemorrhage Neck: Supple, no carotid bruits. Pulmonary: Clear to auscultation bilaterally   NEUROLOGICAL EXAM:  MENTAL STATUS: Speech:  Speech is normal; fluent and spontaneous with normal comprehension.  Cognition:     Orientation to time, place and person     Normal recent and remote memory     Normal Attention span and concentration     Normal Language, naming, repeating,spontaneous speech     Fund of knowledge   CRANIAL NERVES: CN II: Visual fields are full to confrontation. Fundoscopic exam is normal with sharp discs and no vascular changes. Pupils are round equal and briskly reactive to light. CN III, IV, VI: extraocular movement are normal. No ptosis. CN V: Facial sensation is intact to pinprick in all 3 divisions bilaterally. Corneal responses are intact.  CN VII: Face is symmetric with normal eye closure and smile. CN VIII: Hearing is normal to rubbing fingers CN IX, X: Palate elevates symmetrically. Phonation is normal. CN XI: Head turning and shoulder shrug are intact CN XII: Tongue is midline with normal movements and no atrophy.  MOTOR: Mild bilateral lower extremity spasticity, bilateral shoulder abduction, external rotation 4/5, mild bilateral hip flexion weakness  REFLEXES: Reflexes are 3 and symmetric at the biceps,  triceps, knees, and ankles. Plantar responses are extensor bilaterally, there was nonsustained bilateral ankle clonus.  SENSORY: Length dependent decreased to light touch, pinprick above elbow, and to above knee level  COORDINATION: Rapid alternating movements and fine finger movements are intact. There is no dysmetria on finger-to-nose and heel-knee-shin.    GAIT/STANCE: He walks with a stiff, mildly cautious gait,   DIAGNOSTIC DATA (LABS, IMAGING, TESTING) - I reviewed patient records, labs, notes, testing and imaging myself where available.   ASSESSMENT AND PLAN  WALBERTO FITTON is a 82 y.o. male   Cervical C4-5 decompression Residual evidence of cervical myelopathy, including hyperreflexia, bilateral ankle clonus, Babinski signs, paresthesia involving bilateral lower and upper extremity  Cymbalta 60 mg daily Return to clinic in 3 months    Marcial Pacas, M.D. Ph.D.  [facility] [address], [place], [place] [address] Ph: [phone] Fax: [phone]  CC: Referring Provider

## 2016-06-13 ENCOUNTER — Ambulatory Visit: Payer: Self-pay | Admitting: Neurology

## 2016-09-04 ENCOUNTER — Encounter: Payer: Self-pay | Admitting: Neurology

## 2016-09-04 ENCOUNTER — Ambulatory Visit (INDEPENDENT_AMBULATORY_CARE_PROVIDER_SITE_OTHER): Payer: Medicare Other | Admitting: Neurology

## 2016-09-04 VITALS — BP 137/74 | HR 70 | Ht 68.0 in | Wt 170.0 lb

## 2016-09-04 DIAGNOSIS — R202 Paresthesia of skin: Secondary | ICD-10-CM

## 2016-09-04 DIAGNOSIS — M4712 Other spondylosis with myelopathy, cervical region: Secondary | ICD-10-CM

## 2016-09-04 MED ORDER — PREGABALIN 50 MG PO CAPS: 50.0000 mg | ORAL_CAPSULE | Freq: Three times a day (TID) | ORAL | 5 refills | Status: DC

## 2016-09-04 NOTE — Progress Notes (Signed)
PATIENT: Gary Craig DOB: 1934/01/11  Chief Complaint  Patient presents with  . Peripheral Neuropathy    He is here with his wife, [name].  He only tried duloxetine for one week before stopping the medication.  He was given gabapentin 300mg , once daily but has also stopped this medication after one week.       HISTORICAL  Gary Craig is a 83 years old right-handed male, accompanied by his wife, referred by neurosurgeon \\Dr . Kary Kos for evaluation of a ascending paresthesia, involving both feet, and hands, initial evaluation was June 04 2016,  He had a history of hypertension, hyperlipidemia, type 2 diabetes, history of coronary artery disease, CABG in June 2006  In June 2017, he noticed numbness at bilateral forearm, MRI of cervical spine showed in June 2017 showed spinal stenosis and cord compression with slightly increased cord signal at C4-5.  He had anterior C4-5 disectomy and fusion by Dr. [name] on January 16 2016, post surgically, he continued to experience bilateral arm paresthesia, about 2 weeks later, he also noticed gait abnormality, ascending paresthesia from bilateral feet to knee level, woke up February 05 2016 noticed profound gait abnormality, presented to the emergency room, repeat MRI of the cervical spine February 05 2016 showed abnormal fluid collection projecting dorsally from C4-5 interspace, status post C4-5 ACDF on January 16 2016, mild peripheral enhancement mild cord flattening and effacement of both C5 nerve roots.  He had developed quadriplegia from the cord compression, had emergency exploration of C4-5 fusion, removal of the plate and the bony craft, evacuation of epidural hematoma, insertion of a new allograft of 9 mm height, new plate,  He required few weeks rehabilitation to be able to walk again, still with mild spastic gait, continue have bilateral arm and leg paresthesia to above elbow and above knee level, over the past few months, he had a variable  degree of paresthesia, sometimes feel like it is continue ascending proximally, which has caused a lot of anxiety. He denies bowel and bladder incontinence.  He had a repeat MRI cervical spine at neurosurgical clinic on May 16 2016, anterior and posterior fusion at C4-5, resolution of the ventral extradural fluid collection, mild stenosis at C4-5.  He was given prescription of gabapentin, causing dizziness without helping his symptoms.  UPDATE September 04 2016: He continued to experience bilateral arm and lower extremity paresthesia, seems to be a ascending to him, but he denies gait difficulty denied bowel and bladder incontinence,  He has tried Cymbalta 60 mg short period of time, no longer taking it, previously tried gabapentin only few days,  REVIEW OF SYSTEMS: Full 14 system review of systems performed and notable only for numbness, feeling cold, swelling legs  ALLERGIES: Allergies  Allergen Reactions  . No Known Allergies     HOME MEDICATIONS: Current Outpatient Prescriptions  Medication Sig Dispense Refill  . atorvastatin (LIPITOR) 40 MG tablet Take 1 tablet (40 mg total) by mouth at bedtime. 30 tablet 0  . clopidogrel (PLAVIX) 75 MG tablet daily.    . Cyanocobalamin (VITAMIN B-12 PO) Take by mouth daily.    Marland Kitchen gabapentin (NEURONTIN) 300 MG capsule daily.    Marland Kitchen glimepiride (AMARYL) 4 MG tablet Take 1 tablet (4 mg total) by mouth daily with breakfast. 30 tablet 1  . Glucosamine-Chondroitin 500-400 MG CAPS Take 1 tablet by mouth every morning.     Marland Kitchen levothyroxine (SYNTHROID, LEVOTHROID) 112 MCG tablet Take 1 tablet (112 mcg total) by mouth daily  before breakfast. 30 tablet 0  . lisinopril (PRINIVIL,ZESTRIL) 5 MG tablet Take 1 tablet (5 mg total) by mouth daily. 30 tablet 1  . Multiple Vitamin (MULTIVITAMIN WITH MINERALS) TABS tablet Take 1 tablet by mouth every morning.     . ranitidine (ZANTAC) 75 MG tablet Take 75 mg by mouth daily as needed for heartburn.      No current  facility-administered medications for this visit.     PAST MEDICAL HISTORY: Past Medical History:  Diagnosis Date  . CAD (coronary artery disease)   . Carotid stenosis   . Diabetes mellitus without complication ([place])   . GERD (gastroesophageal reflux disease)    zantac as needed  . High cholesterol   . History of kidney stones   . Hypothyroidism   . Neuropathy ([place])   . S/P CABG (coronary artery bypass graft)   . Skin cancer   . Stroke [facility]) 2012   tia    PAST SURGICAL HISTORY: Past Surgical History:  Procedure Laterality Date  . ANTERIOR CERVICAL DECOMP/DISCECTOMY FUSION N/A 01/16/2016   Procedure: Cervical Four-Five Anterior cervical decompression/diskectomy/fusion;  Surgeon: Kary Kos, MD;  Location: [place] NEURO ORS;  Service: Neurosurgery;  Laterality: N/A;  right side approach  . CERVICAL WOUND DEBRIDEMENT N/A 02/05/2016   Procedure: Exploration of Cervical four - five wound. Removal of cervical four - five instrumentation. Insertion of new cervical four - five instrumentation.;  Surgeon: Leeroy Cha, MD;  Location: [facility];  Service: Neurosurgery;  Laterality: N/A;  Exploration of Cervical four - five wound. Removal of cervical four - five instrumentation. Insertion of new cervical four - five instrumentatio  . CORONARY ARTERY BYPASS GRAFT  11/29/2004  . EAR BIOPSY     skin ca  . LITHOTRIPSY Right   . POSTERIOR CERVICAL FUSION/FORAMINOTOMY N/A 02/08/2016   Procedure: CERVICAL FOUR-FIVE  Decompressive Laminectomy and Lateral Mass Fixation;  Surgeon: Kary Kos, MD;  Location: [place] NEURO ORS;  Service: Neurosurgery;  Laterality: N/A;  . TONSILLECTOMY      FAMILY HISTORY: Family History  Problem Relation Age of Onset  . Heart disease Mother   . Heart disease Father   . CAD Brother   . Heart block Brother     SOCIAL HISTORY:  Social History   Social History  . Marital status: Married    Spouse name: N/A  . Number of children: 2  . Years of education: some  college   Occupational History  . Retired    Social History Main Topics  . Smoking status: Former Smoker    Packs/day: 1.00    Types: Cigarettes, Pipe, Cigars    Quit date: 01/09/1983  . Smokeless tobacco: Never Used  . Alcohol use No  . Drug use: No  . Sexual activity: Not on file   Other Topics Concern  . Not on file   Social History Narrative   Lives at home with his wife.   Right-handed.   No caffeine use.     PHYSICAL EXAM   Vitals:   09/04/16 1316  BP: 137/74  Pulse: 70  Weight: 170 lb (77.1 kg)  Height: 5\' 8"  (1.727 m)    Not recorded      Body mass index is 25.85 kg/m.  PHYSICAL EXAMNIATION:  Gen: NAD, conversant, well nourised, obese, well groomed                     Cardiovascular: Regular rate rhythm, no peripheral edema, warm, nontender. Eyes: Conjunctivae clear  without exudates or hemorrhage Neck: Supple, no carotid bruits. Pulmonary: Clear to auscultation bilaterally   NEUROLOGICAL EXAM:  MENTAL STATUS: Speech:    Speech is normal; fluent and spontaneous with normal comprehension.  Cognition:     Orientation to time, place and person     Normal recent and remote memory     Normal Attention span and concentration     Normal Language, naming, repeating,spontaneous speech     Fund of knowledge   CRANIAL NERVES: CN II: Visual fields are full to confrontation. Fundoscopic exam is normal with sharp discs and no vascular changes. Pupils are round equal and briskly reactive to light. CN III, IV, VI: extraocular movement are normal. No ptosis. CN V: Facial sensation is intact to pinprick in all 3 divisions bilaterally. Corneal responses are intact.  CN VII: Face is symmetric with normal eye closure and smile. CN VIII: Hearing is normal to rubbing fingers CN IX, X: Palate elevates symmetrically. Phonation is normal. CN XI: Head turning and shoulder shrug are intact CN XII: Tongue is midline with normal movements and no atrophy.  MOTOR: Mild  bilateral lower extremity spasticity, bilateral shoulder abduction, external rotation 4/5, mild bilateral hip flexion weakness  REFLEXES: Reflexes are 3 and symmetric at the biceps, triceps, knees, and ankles. Plantar responses are extensor bilaterally, there was nonsustained bilateral ankle clonus.  SENSORY: Length dependent decreased to light touch, pinprick above elbow, and to above knee level  COORDINATION: Rapid alternating movements and fine finger movements are intact. There is no dysmetria on finger-to-nose and heel-knee-shin.    GAIT/STANCE: He walks with a stiff, mildly cautious gait,   DIAGNOSTIC DATA (LABS, IMAGING, TESTING) - I reviewed patient records, labs, notes, testing and imaging myself where available.   ASSESSMENT AND PLAN  Gary Craig is a 83 y.o. male   Cervical C4-5 decompression Residual evidence of cervical myelopathy, including hyperreflexia, bilateral ankle clonus, Babinski signs, paresthesia involving bilateral lower and upper extremity  After discuss with patient, he would willing to try Lyrica, new prescriptions 50 mg 3 times a day was provided,    Marcial Pacas, M.D. Ph.D.  [facility] [address] [place], [place] [address] Ph: [phone] Fax: [phone]  CC: Referring Provider

## 2017-02-21 ENCOUNTER — Ambulatory Visit: Payer: Medicare Other | Attending: Neurology

## 2017-02-21 DIAGNOSIS — Z9181 History of falling: Secondary | ICD-10-CM | POA: Diagnosis not present

## 2017-02-21 DIAGNOSIS — R2689 Other abnormalities of gait and mobility: Secondary | ICD-10-CM | POA: Diagnosis present

## 2017-02-21 DIAGNOSIS — R278 Other lack of coordination: Secondary | ICD-10-CM | POA: Diagnosis present

## 2017-02-21 NOTE — Therapy (Signed)
[place] MAIN [facility] SERVICES [address] [place], [place], [address] Phone: [phone]   Fax:  [phone]  Physical Therapy Evaluation  Patient Details  Name: Gary Craig MRN: [id number] Date of Birth: Feb 29, 1934 Referring Provider: Dr. Joselyn Arrow  Encounter Date: 02/21/2017      PT End of Session - 02/21/17 1259    Visit Number 1   Number of Visits 16   Date for PT Re-Evaluation 04/18/17   Authorization - Visit Number 1   Authorization - Number of Visits 10   PT Start Time 0845   PT Stop Time 0939   PT Time Calculation (min) 54 min   Equipment Utilized During Treatment Gait belt   Activity Tolerance Patient tolerated treatment well   Behavior During Therapy [facility] for tasks assessed/performed      Past Medical History:  Diagnosis Date  . CAD (coronary artery disease)   . Carotid stenosis   . Diabetes mellitus without complication ([place])   . GERD (gastroesophageal reflux disease)    zantac as needed  . High cholesterol   . History of kidney stones   . Hypothyroidism   . Neuropathy   . S/P CABG (coronary artery bypass graft)   . Skin cancer   . Stroke [facility]) 2012   tia    Past Surgical History:  Procedure Laterality Date  . ANTERIOR CERVICAL DECOMP/DISCECTOMY FUSION N/A 01/16/2016   Procedure: Cervical Four-Five Anterior cervical decompression/diskectomy/fusion;  Surgeon: Kary Kos, MD;  Location: [place] NEURO ORS;  Service: Neurosurgery;  Laterality: N/A;  right side approach  . CERVICAL WOUND DEBRIDEMENT N/A 02/05/2016   Procedure: Exploration of Cervical four - five wound. Removal of cervical four - five instrumentation. Insertion of new cervical four - five instrumentation.;  Surgeon: Leeroy Cha, MD;  Location: [facility]UR[facility]S;  Service: Neurosurgery;  Laterality: N/A;  Exploration of Cervical four - five wound. Removal of cervical four - five instrumentation. Insertion of new cervical four - five instrumentatio  . CORONARY  ARTERY BYPASS GRAFT  11/29/2004  . EAR BIOPSY     skin ca  . LITHOTRIPSY Right   . POSTERIOR CERVICAL FUSION/FORAMINOTOMY N/A 02/08/2016   Procedure: CERVICAL FOUR-FIVE  Decompressive Laminectomy and Lateral Mass Fixation;  Surgeon: Kary Kos, MD;  Location: [place] NEURO ORS;  Service: Neurosurgery;  Laterality: N/A;  . TONSILLECTOMY      There were no vitals filed for this visit.         Subjective Assessment - 02/21/17 0857    Subjective Patient is a pleasant 83 year old male who was referred to physical therapy for imbalance   Pertinent History Patient is a pleasant 83 year old male who had an anterior C4-5 discectomy and fusion which resulted in abnormal fluid collection leading to pt. To develop quadriplegia. Removal of the plate and bony graft was performed with new plate then being inserted. He went to rehabilitation for OT and PT afterwards. Continues to have some balance difficulties. Goes to gym Mon, Wed, Fri work out 45 minutes every day. Try to walk 2 miles a day.    Limitations Walking;Standing;House hold activities;Other (comment)   How long can you sit comfortably? n/a   How long can you stand comfortably? feel more comfortable holding onto something   How long can you walk comfortably? furtniture surfs   Patient Stated Goals improve balance   Currently in Pain? No/denies      PAIN: Pain: 3/10 tingling in hands  POSTURE: Rounded  shoulders flexed trunk  STRENGTH:  Graded on a 0-5 scale Muscle Group Left Right  Hip Flex 5/5 4+/5  Hip Abd 5/5 4+/5  Hip Add 5/5 4+/5  Hip Ext 5/5 4+/5  Hip IR/ER 5/5 4+/5  Knee Flex 5/5 4+/5  Knee Ext 5/5 4+/5  Ankle DF 5/5 4/5  Ankle PF 55 4/5   SENSATION:  WFL bilat  SPECIAL TESTS: Vision fields=normal bilaterally Follow H normal  FUNCTIONAL MOBILITY: Unsteadiness upon standing to quickly, Independent with tranfers-need extra time  BALANCE: Dynamic Sitting Balance  Normal Able to sit unsupported and weight shift across  midline maximally   Good Able to sit unsupported and weight shift across midline moderately   Good-/Fair+ Able to sit unsupported and weight shift across midline minimally x  Fair Minimal weight shifting ipsilateral/front, difficulty crossing midline   Fair- Reach to ipsilateral side and unable to weight shift   Poor + Able to sit unsupported with min A and reach to ipsilateral side, unable to weight shift   Poor Able to sit unsupported with mod A and reach ipsilateral/front-can't cross midline     Standing Dynamic Balance  Normal Stand independently unsupported, able to weight shift and cross midline maximally   Good Stand independently unsupported, able to weight shift and cross midline moderately x  Good-/Fair+ Stand independently unsupported, able to weight shift across midline minimally   Fair Stand independently unsupported, weight shift, and reach ipsilaterally, loss of balance when crossing midline   Poor+ Able to stand with Min A and reach ipsilaterally, unable to weight shift   Poor Able to stand with Mod A and minimally reach ipsilaterally, unable to cross midline.     Static Sitting Balance  Normal Able to maintain balance against maximal resistance   Good Able to maintain balance against moderate resistance   Good-/Fair+ Accepts minimal resistance x  Fair Able to sit unsupported without balance loss and without UE support   Poor+ Able to maintain with Minimal assistance from individual or chair   Poor Unable to maintain balance-requires mod/max support from individual or chair      Static Standing Balance  Normal Able to maintain standing balance against maximal resistance   Good Able to maintain standing balance against moderate resistance   Good-/Fair+ Able to maintain standing balance against minimal resistance   Fair Able to stand unsupported without UE support and without LOB for 1-2 min x  Fair- Requires Min A and UE support to maintain standing without loss of  balance   Poor+ Requires mod A and UE support to maintain standing without loss of balance   Poor Requires max A and UE support to maintain standing balance without loss        OUTCOME MEASURES: TEST Outcome Interpretation  5 times sit<>stand 12 sec >60 yo, >15 sec indicates increased risk for falls  Berg Balance Assessment 47/56 <36/56 (100% risk for falls), 37-45 (80% risk for falls); 46-51 (>50% risk for falls); 52-55 (lower risk <25% of falls)  DGI=18/24 ABC=71.8%   Neuro Re-ed Slow marches at support surface SLS at support surface Tandem stance at support surface Ankle pumps Sit to stand   Objective measurements completed on examination: See above findings.                  PT Education - 02/21/17 1259    Education provided Yes   Education Details HEP, balance deficit, gait    Person(s) Educated Patient   Methods Explanation;Demonstration;Handout   Comprehension  Verbalized understanding;Returned demonstration          PT Short Term Goals - 02/21/17 1307      PT SHORT TERM GOAL #1   Title Patient will stand on one leg for 10 seconds without trunk sway demonstrating improved balance for safe mobility   Baseline difficulty with SLS, excessive trunk sway   Time 2   Period Weeks   Status New   Target Date 03/07/17     PT SHORT TERM GOAL #2   Title Patient will ambulate with head turns for 10 feet without LOB to increase safety while ambulating in community.    Baseline LOB while ambulating with head turns   Time 2   Period Weeks   Status New   Target Date 03/07/17           PT Long Term Goals - 02/21/17 1309      PT LONG TERM GOAL #1   Title Patient will increase Berg Balance score by > 6 points (53/56) to demonstrate decreased fall risk during functional activities.   Baseline 9/6: 47/56   Time 8   Period Weeks   Status New   Target Date 04/18/17     PT LONG TERM GOAL #2   Title Patient will increase ABC scale score >80% to  demonstrate better functional mobility and better confidence with ADLs.    Baseline 9/6: 71.8%   Time 8   Period Weeks   Status New   Target Date 04/18/17     PT LONG TERM GOAL #3   Title Patient will increase dynamic gait index score to >21/24 as to demonstrate reduced fall risk and improved dynamic gait balance for better safety with community/home ambulation.    Baseline 9/6: 18/24   Time 8   Period Weeks   Status New   Target Date 04/18/17     PT LONG TERM GOAL #4   Title Patient will deny any falls over past 4 weeks to demonstrate improved safety awareness at home and work.    Baseline 2 falls   Time 8   Period Weeks   Status New   Target Date 04/18/17                Plan - 02/21/17 1300    Clinical Impression Statement Patient is a pleasant 83 year old male who presents to physical therapy for imbalance s/p complications of cervical fusion in 2017. DGI=18/24 with difficulty performing dual tasks while ambulating. BERG=47/56, 5xSTS=12 seconds, ABC=71.8%. Patient has difficulty balancing with eyes open dynamically and eyes closed statically. Patient would benefit from skilled physical therapy to improve balance and decrease fall risk for safe interaction with natural environment.    History and Personal Factors relevant to plan of care: This patient presents with 3, personal factors/ comorbidities  And 4  body elements including body structures and functions, activity limitations and or participation restrictions. Patient's condition is  evolving,    Clinical Presentation Evolving   Clinical Presentation due to: s/p complication of anterior fusion that developed into quadriplegia that slowly has improved.    Clinical Decision Making Moderate   Rehab Potential Fair   Clinical Impairments Affecting Rehab Potential (+) good mindset, motivation, (-) hypertension, hyperlipidemia, type II diabetes, CAD, CABG, Cervical fusion, cancer, graves disease, stroke   PT Frequency 2x /  week   PT Duration 8 weeks   PT Treatment/Interventions ADLs/Self Care Home Management;DME Instruction;Traction;Balance training;Therapeutic exercise;Therapeutic activities;Functional mobility training;Stair training;Gait training;Neuromuscular re-education;Cognitive remediation;Patient/family education;Manual techniques;Passive  range of motion;Energy conservation;Taping;Vestibular;Visual/perceptual remediation/compensation   PT Next Visit Plan review HEP, Airex pad   PT Home Exercise Plan see sheet   Consulted and Agree with Plan of Care Patient      Patient will benefit from skilled therapeutic intervention in order to improve the following deficits and impairments:  Abnormal gait, Decreased activity tolerance, Decreased balance, Decreased coordination, Decreased endurance, Decreased strength, Postural dysfunction, Improper body mechanics, Decreased safety awareness, Difficulty walking  Visit Diagnosis: History of falling  Abnormal coordination  Other abnormalities of gait and mobility      G-Codes - 23-Mar-2017 1313    Functional Assessment Tool Used (Outpatient Only) ABC,DGI, 5xSTS, BERG, clinical judgement,   Functional Limitation Mobility: Walking and moving around   Mobility: Walking and Moving Around Current Status ([id number]) At least 20 percent but less than 40 percent impaired, limited or restricted   Mobility: Walking and Moving Around Goal Status [phone]) At least 1 percent but less than 20 percent impaired, limited or restricted       Problem List Patient Active Problem List   Diagnosis Date Noted  . Paresthesia 09/04/2016  . Essential hypertension 02/10/2016  . Spinal stenosis, cervical region 02/08/2016  . Surgery, other elective   . Spondylosis, cervical, with myelopathy   . Diabetes mellitus type 2 in nonobese (HCC)   . Coronary artery disease involving native coronary artery of native heart without angina pectoris   . History of CVA (cerebrovascular accident)   .  Post-operative pain   . Dysphagia   . Leukocytosis   . Tachypnea   . Quadriparesis ([place]) 02/05/2016  . Myelopathy, spondylogenic, cervical 01/16/2016  . CVA (cerebral infarction) 05/31/2015   Janna Arch, PT, DPT   Janna Arch Mar 23, 2017, 1:14 PM  [place] MAIN [facility] SERVICES [address] [place], [place], [address] Phone: [phone]   Fax:  [phone]  Name: Gary Craig MRN: [id number] Date of Birth: 11-Nov-1933

## 2017-02-26 ENCOUNTER — Ambulatory Visit: Payer: Medicare Other

## 2017-02-26 DIAGNOSIS — R278 Other lack of coordination: Secondary | ICD-10-CM

## 2017-02-26 DIAGNOSIS — Z9181 History of falling: Secondary | ICD-10-CM | POA: Diagnosis not present

## 2017-02-26 DIAGNOSIS — R2689 Other abnormalities of gait and mobility: Secondary | ICD-10-CM

## 2017-02-26 NOTE — Therapy (Signed)
[place] MAIN [facility] SERVICES [address] [place], [place], [address] Phone: [phone]   Fax:  [phone]  Physical Therapy Treatment  Patient Details  Name: Gary Craig MRN: [id number] Date of Birth: July 05, 1933 Referring Provider: Dr. Joselyn Arrow  Encounter Date: 02/26/2017      PT End of Session - 02/26/17 1706    Visit Number 2   Number of Visits 16   Date for PT Re-Evaluation 04/18/17   Authorization - Visit Number 2   Authorization - Number of Visits 10   PT Start Time 6160   PT Stop Time 1600   PT Time Calculation (min) 45 min   Equipment Utilized During Treatment Gait belt   Activity Tolerance Patient tolerated treatment well   Behavior During Therapy [facility] for tasks assessed/performed      Past Medical History:  Diagnosis Date  . CAD (coronary artery disease)   . Carotid stenosis   . Diabetes mellitus without complication ([place])   . GERD (gastroesophageal reflux disease)    zantac as needed  . High cholesterol   . History of kidney stones   . Hypothyroidism   . Neuropathy   . S/P CABG (coronary artery bypass graft)   . Skin cancer   . Stroke [facility]) 2012   tia    Past Surgical History:  Procedure Laterality Date  . ANTERIOR CERVICAL DECOMP/DISCECTOMY FUSION N/A 01/16/2016   Procedure: Cervical Four-Five Anterior cervical decompression/diskectomy/fusion;  Surgeon: Kary Kos, MD;  Location: [place] NEURO ORS;  Service: Neurosurgery;  Laterality: N/A;  right side approach  . CERVICAL WOUND DEBRIDEMENT N/A 02/05/2016   Procedure: Exploration of Cervical four - five wound. Removal of cervical four - five instrumentation. Insertion of new cervical four - five instrumentation.;  Surgeon: Leeroy Cha, MD;  Location: [facility]S;  Service: Neurosurgery;  Laterality: N/A;  Exploration of Cervical four - five wound. Removal of cervical four - five instrumentation. Insertion of new cervical four - five instrumentatio  . CORONARY  ARTERY BYPASS GRAFT  11/29/2004  . EAR BIOPSY     skin ca  . LITHOTRIPSY Right   . POSTERIOR CERVICAL FUSION/FORAMINOTOMY N/A 02/08/2016   Procedure: CERVICAL FOUR-FIVE  Decompressive Laminectomy and Lateral Mass Fixation;  Surgeon: Kary Kos, MD;  Location: [place] NEURO ORS;  Service: Neurosurgery;  Laterality: N/A;  . TONSILLECTOMY      There were no vitals filed for this visit.      Subjective Assessment - 02/26/17 1518    Subjective Patient reports no falls, has had two stumbles over right foot. Tired from mowing lawn.    Pertinent History Patient is a pleasant 83 year old male who had an anterior C4-5 discectomy and fusion which resulted in abnormal fluid collection leading to pt. To develop quadriplegia. Removal of the plate and bony graft was performed with new plate then being inserted. He went to rehabilitation for OT and PT afterwards. Continues to have some balance difficulties. Goes to gym Mon, Wed, Fri work out 45 minutes every day. Try to walk 2 miles a day.    Limitations Walking;Standing;House hold activities;Other (comment)   How long can you sit comfortably? n/a   How long can you stand comfortably? feel more comfortable holding onto something   How long can you walk comfortably? furtniture surfs   Patient Stated Goals improve balance   Currently in Pain? No/denies       airex tandem stance with ball toss 15x each arm airex pad:  standing with horizontal head turns 90 seconds x 4  Bosu ball static balance 2x90 seconds CGA horizontal head turns reading cards in hallway Pass bass, vertical toss, and circles and ball toss in long hallway 6x100 ft Stair negotiation reciprocal motion no UE support Eccentric heel taps 10x on 6" step, require single UE support  Ankle pumps 20x     Pt. response to medical necessity: Patient will continue to benefit from skilled physical therapy to improve balance and decrease fall risk for safe interaction with natural environment.                         PT Education - 02/26/17 1706    Education provided Yes   Education Details [employer] fast streamer) Educated Patient   Methods Explanation;Demonstration   Comprehension Verbalized understanding;Returned demonstration          PT Short Term Goals - 02/21/17 1307      PT SHORT TERM GOAL #1   Title Patient will stand on one leg for 10 seconds without trunk sway demonstrating improved balance for safe mobility   Baseline difficulty with SLS, excessive trunk sway   Time 2   Period Weeks   Status New   Target Date 03/07/17     PT SHORT TERM GOAL #2   Title Patient will ambulate with head turns for 10 feet without LOB to increase safety while ambulating in community.    Baseline LOB while ambulating with head turns   Time 2   Period Weeks   Status New   Target Date 03/07/17           PT Long Term Goals - 02/21/17 1309      PT LONG TERM GOAL #1   Title Patient will increase Berg Balance score by > 6 points (53/56) to demonstrate decreased fall risk during functional activities.   Baseline 9/6: 47/56   Time 8   Period Weeks   Status New   Target Date 04/18/17     PT LONG TERM GOAL #2   Title Patient will increase ABC scale score >80% to demonstrate better functional mobility and better confidence with ADLs.    Baseline 9/6: 71.8%   Time 8   Period Weeks   Status New   Target Date 04/18/17     PT LONG TERM GOAL #3   Title Patient will increase dynamic gait index score to >21/24 as to demonstrate reduced fall risk and improved dynamic gait balance for better safety with community/home ambulation.    Baseline 9/6: 18/24   Time 8   Period Weeks   Status New   Target Date 04/18/17     PT LONG TERM GOAL #4   Title Patient will deny any falls over past 4 weeks to demonstrate improved safety awareness at home and work.    Baseline 2 falls   Time 8   Period Weeks   Status New   Target Date 04/18/17                Plan - 02/26/17 1710    Clinical Impression Statement Patient is challenged by unstable surfaces and dynamic movements with ambulation. Limited ankle strategy noted increasing LOB. Patient educated on HEP compliance and stair negotiation. Patient will continue to benefit from skilled physical therapy to improve balance and decrease fall risk for safe interaction with natural environment.    Rehab Potential Fair   Clinical Impairments Affecting Rehab Potential (+)  good mindset, motivation, (-) hypertension, hyperlipidemia, type II diabetes, CAD, CABG, Cervical fusion, cancer, graves disease, stroke   PT Frequency 2x / week   PT Duration 8 weeks   PT Treatment/Interventions ADLs/Self Care Home Management;DME Instruction;Traction;Balance training;Therapeutic exercise;Therapeutic activities;Functional mobility training;Stair training;Gait training;Neuromuscular re-education;Cognitive remediation;Patient/family education;Manual techniques;Passive range of motion;Energy conservation;Taping;Vestibular;Visual/perceptual remediation/compensation   PT Next Visit Plan review HEP, Airex pad   PT Home Exercise Plan see sheet   Consulted and Agree with Plan of Care Patient      Patient will benefit from skilled therapeutic intervention in order to improve the following deficits and impairments:  Abnormal gait, Decreased activity tolerance, Decreased balance, Decreased coordination, Decreased endurance, Decreased strength, Postural dysfunction, Improper body mechanics, Decreased safety awareness, Difficulty walking  Visit Diagnosis: History of falling  Abnormal coordination  Other abnormalities of gait and mobility     Problem List Patient Active Problem List   Diagnosis Date Noted  . Paresthesia 09/04/2016  . Essential hypertension 02/10/2016  . Spinal stenosis, cervical region 02/08/2016  . Surgery, other elective   . Spondylosis, cervical, with myelopathy   . Diabetes  mellitus type 2 in nonobese (HCC)   . Coronary artery disease involving native coronary artery of native heart without angina pectoris   . History of CVA (cerebrovascular accident)   . Post-operative pain   . Dysphagia   . Leukocytosis   . Tachypnea   . Quadriparesis ([place]) 02/05/2016  . Myelopathy, spondylogenic, cervical 01/16/2016  . CVA (cerebral infarction) 05/31/2015   Janna Arch, PT, DPT   Janna Arch 02/26/2017, 5:11 PM  [place] MAIN [facility] SERVICES [address] [place], [place], [address] Phone: [phone]   Fax:  [phone]  Name: Gary Craig MRN: [id number] Date of Birth: May 14, 1934

## 2017-03-01 ENCOUNTER — Encounter: Payer: Self-pay | Admitting: Physical Therapy

## 2017-03-01 ENCOUNTER — Ambulatory Visit: Payer: Medicare Other | Admitting: Physical Therapy

## 2017-03-01 VITALS — BP 129/54 | HR 56

## 2017-03-01 DIAGNOSIS — Z9181 History of falling: Secondary | ICD-10-CM

## 2017-03-01 DIAGNOSIS — R2689 Other abnormalities of gait and mobility: Secondary | ICD-10-CM

## 2017-03-01 DIAGNOSIS — R278 Other lack of coordination: Secondary | ICD-10-CM

## 2017-03-01 NOTE — Therapy (Signed)
[place] MAIN [facility] SERVICES [address] [place], [place], [address] Phone: [phone]   Fax:  [phone]  Physical Therapy Treatment  Patient Details  Name: Gary Craig MRN: [id number] Date of Birth: Nov 04, 1933 Referring Provider: Dr. Joselyn Arrow  Encounter Date: 03/01/2017      PT End of Session - 03/01/17 0911    Visit Number 3   Number of Visits 16   Date for PT Re-Evaluation 04/18/17   Authorization - Visit Number 3   Authorization - Number of Visits 10   PT Start Time 0912   PT Stop Time 1004   PT Time Calculation (min) 52 min   Equipment Utilized During Treatment Gait belt   Activity Tolerance Patient tolerated treatment well   Behavior During Therapy [facility] for tasks assessed/performed      Past Medical History:  Diagnosis Date  . CAD (coronary artery disease)   . Carotid stenosis   . Diabetes mellitus without complication ([place])   . GERD (gastroesophageal reflux disease)    zantac as needed  . High cholesterol   . History of kidney stones   . Hypothyroidism   . Neuropathy   . S/P CABG (coronary artery bypass graft)   . Skin cancer   . Stroke [facility]) 2012   tia    Past Surgical History:  Procedure Laterality Date  . ANTERIOR CERVICAL DECOMP/DISCECTOMY FUSION N/A 01/16/2016   Procedure: Cervical Four-Five Anterior cervical decompression/diskectomy/fusion;  Surgeon: Kary Kos, MD;  Location: [place] NEURO ORS;  Service: Neurosurgery;  Laterality: N/A;  right side approach  . CERVICAL WOUND DEBRIDEMENT N/A 02/05/2016   Procedure: Exploration of Cervical four - five wound. Removal of cervical four - five instrumentation. Insertion of new cervical four - five instrumentation.;  Surgeon: Leeroy Cha, MD;  Location: [facility] NEURO ORS;  Service: Neurosurgery;  Laterality: N/A;  Exploration of Cervical four - five wound. Removal of cervical four - five instrumentation. Insertion of new cervical four - five instrumentatio  . CORONARY  ARTERY BYPASS GRAFT  11/29/2004  . EAR BIOPSY     skin ca  . LITHOTRIPSY Right   . POSTERIOR CERVICAL FUSION/FORAMINOTOMY N/A 02/08/2016   Procedure: CERVICAL FOUR-FIVE  Decompressive Laminectomy and Lateral Mass Fixation;  Surgeon: Kary Kos, MD;  Location: [place] NEURO ORS;  Service: Neurosurgery;  Laterality: N/A;  . TONSILLECTOMY      Vitals:   03/01/17 0917  BP: (!) 129/54  Pulse: (!) 56  SpO2: 99%        Subjective Assessment - 03/01/17 0915    Subjective Pt reports he is having difficulty with his balance exercises at home as they are challenging due to impaired balance.  Denies any falls.  No new complaints or concerns.     Pertinent History Patient is a pleasant 83 year old male who had an anterior C4-5 discectomy and fusion which resulted in abnormal fluid collection leading to pt. To develop quadriplegia. Removal of the plate and bony graft was performed with new plate then being inserted. He went to rehabilitation for OT and PT afterwards. Continues to have some balance difficulties. Goes to gym Mon, Wed, Fri work out 45 minutes every day. Try to walk 2 miles a day.    Limitations Walking;Standing;House hold activities;Other (comment)   How long can you sit comfortably? n/a   How long can you stand comfortably? feel more comfortable holding onto something   How long can you walk comfortably? furtniture surfs   Patient Stated  Goals improve balance   Currently in Pain? No/denies       TREATMENT   Neuromuscular Re-Ed:   Rhomberg stance on airex with ball toss x15 to each side   Marching in place on airex pad with significant instability but no LOB   Squats on on airex x15 with LOB x1, using // bars to steady     Therapeutic Exercise:   Lateral lunges x15 each direction   Side stepping up and over cone x2 minutes with cues for motor planning.   Ambulating in hallway with vertical and horizontal head turns x2 minutes.   Ambulating in hallway with cues for  spontaneous direction changes forward, back, left, right. X3 minutes   Agility ladder laterally two steps in and two steps out with focus on speed. x4 lengths   Agility ladder two steps forward, one step back with focus on speed. X4 lengths            PT Education - 03/01/17 0911    Education provided Yes   Education Details Exercise technique   Person(s) Educated Patient   Methods Explanation;Demonstration   Comprehension Verbalized understanding;Returned demonstration;Need further instruction          PT Short Term Goals - 02/21/17 1307      PT SHORT TERM GOAL #1   Title Patient will stand on one leg for 10 seconds without trunk sway demonstrating improved balance for safe mobility   Baseline difficulty with SLS, excessive trunk sway   Time 2   Period Weeks   Status New   Target Date 03/07/17     PT SHORT TERM GOAL #2   Title Patient will ambulate with head turns for 10 feet without LOB to increase safety while ambulating in community.    Baseline LOB while ambulating with head turns   Time 2   Period Weeks   Status New   Target Date 03/07/17           PT Long Term Goals - 02/21/17 1309      PT LONG TERM GOAL #1   Title Patient will increase Berg Balance score by > 6 points (53/56) to demonstrate decreased fall risk during functional activities.   Baseline 9/6: 47/56   Time 8   Period Weeks   Status New   Target Date 04/18/17     PT LONG TERM GOAL #2   Title Patient will increase ABC scale score >80% to demonstrate better functional mobility and better confidence with ADLs.    Baseline 9/6: 71.8%   Time 8   Period Weeks   Status New   Target Date 04/18/17     PT LONG TERM GOAL #3   Title Patient will increase dynamic gait index score to >21/24 as to demonstrate reduced fall risk and improved dynamic gait balance for better safety with community/home ambulation.    Baseline 9/6: 18/24   Time 8   Period Weeks   Status New   Target Date 04/18/17      PT LONG TERM GOAL #4   Title Patient will deny any falls over past 4 weeks to demonstrate improved safety awareness at home and work.    Baseline 2 falls   Time 8   Period Weeks   Status New   Target Date 04/18/17               Plan - 03/01/17 0933    Clinical Impression Statement Pt tolerated all interventions well this date.  Pt demonstrates difficulty with SLS activities, especially with lateral stepping up and over cones.  He appears to have some challenges with motor planning with this exercise as well.  Introduced Dentist exercise with focus on speed for improved ability to react quickly when ambulating.  Pt demonstrates fatigue at end of session but did not require any rest breaks.  He will continue to benefit from skilled PT interventions for improved balance and strength.   Rehab Potential Fair   Clinical Impairments Affecting Rehab Potential (+) good mindset, motivation, (-) hypertension, hyperlipidemia, type II diabetes, CAD, CABG, Cervical fusion, cancer, graves disease, stroke   PT Frequency 2x / week   PT Duration 8 weeks   PT Treatment/Interventions ADLs/Self Care Home Management;DME Instruction;Traction;Balance training;Therapeutic exercise;Therapeutic activities;Functional mobility training;Stair training;Gait training;Neuromuscular re-education;Cognitive remediation;Patient/family education;Manual techniques;Passive range of motion;Energy conservation;Taping;Vestibular;Visual/perceptual remediation/compensation   PT Next Visit Plan review HEP, Airex pad   PT Home Exercise Plan see sheet   Consulted and Agree with Plan of Care Patient      Patient will benefit from skilled therapeutic intervention in order to improve the following deficits and impairments:  Abnormal gait, Decreased activity tolerance, Decreased balance, Decreased coordination, Decreased endurance, Decreased strength, Postural dysfunction, Improper body mechanics, Decreased safety awareness,  Difficulty walking  Visit Diagnosis: History of falling  Abnormal coordination  Other abnormalities of gait and mobility     Problem List Patient Active Problem List   Diagnosis Date Noted  . Paresthesia 09/04/2016  . Essential hypertension 02/10/2016  . Spinal stenosis, cervical region 02/08/2016  . Surgery, other elective   . Spondylosis, cervical, with myelopathy   . Diabetes mellitus type 2 in nonobese (HCC)   . Coronary artery disease involving native coronary artery of native heart without angina pectoris   . History of CVA (cerebrovascular accident)   . Post-operative pain   . Dysphagia   . Leukocytosis   . Tachypnea   . Quadriparesis ([place]) 02/05/2016  . Myelopathy, spondylogenic, cervical 01/16/2016  . CVA (cerebral infarction) 05/31/2015    Collie Siad PT, DPT 03/01/2017, 10:07 AM  [place] MAIN [facility] SERVICES [address] [place], [place], [address] Phone: [phone]   Fax:  [phone]  Name: ALHASSAN EVERINGHAM MRN: [id number] Date of Birth: [birth year]

## 2017-03-05 ENCOUNTER — Ambulatory Visit: Payer: Medicare Other

## 2017-03-05 VITALS — BP 124/65 | HR 62

## 2017-03-05 DIAGNOSIS — Z9181 History of falling: Secondary | ICD-10-CM

## 2017-03-05 DIAGNOSIS — R2689 Other abnormalities of gait and mobility: Secondary | ICD-10-CM

## 2017-03-05 DIAGNOSIS — R278 Other lack of coordination: Secondary | ICD-10-CM

## 2017-03-05 NOTE — Therapy (Signed)
[place] MAIN [facility] SERVICES [address] [place], [place], [address] Phone: [phone]   Fax:  [phone]  Physical Therapy Treatment  Patient Details  Name: Gary Craig MRN: [id number] Date of Birth: 1934/03/23 Referring Provider: Dr. Joselyn Arrow  Encounter Date: 03/05/2017      PT End of Session - 03/05/17 0756    Visit Number 4   Number of Visits 16   Date for PT Re-Evaluation 04/18/17   Authorization - Visit Number 4   Authorization - Number of Visits 10   PT Start Time 0759   PT Stop Time 0845   PT Time Calculation (min) 46 min   Equipment Utilized During Treatment Gait belt   Activity Tolerance Patient tolerated treatment well   Behavior During Therapy [facility] for tasks assessed/performed      Past Medical History:  Diagnosis Date  . CAD (coronary artery disease)   . Carotid stenosis   . Diabetes mellitus without complication ([place])   . GERD (gastroesophageal reflux disease)    zantac as needed  . High cholesterol   . History of kidney stones   . Hypothyroidism   . Neuropathy   . S/P CABG (coronary artery bypass graft)   . Skin cancer   . Stroke [facility]) 2012   tia    Past Surgical History:  Procedure Laterality Date  . ANTERIOR CERVICAL DECOMP/DISCECTOMY FUSION N/A 01/16/2016   Procedure: Cervical Four-Five Anterior cervical decompression/diskectomy/fusion;  Surgeon: Kary Kos, MD;  Location: [place] NEURO ORS;  Service: Neurosurgery;  Laterality: N/A;  right side approach  . CERVICAL WOUND DEBRIDEMENT N/A 02/05/2016   Procedure: Exploration of Cervical four - five wound. Removal of cervical four - five instrumentation. Insertion of new cervical four - five instrumentation.;  Surgeon: Leeroy Cha, MD;  Location: [facility] NEURO [facility]S;  Service: Neurosurgery;  Laterality: N/A;  Exploration of Cervical four - five wound. Removal of cervical four - five instrumentation. Insertion of new cervical four - five instrumentatio  . CORONARY  ARTERY BYPASS GRAFT  11/29/2004  . EAR BIOPSY     skin ca  . LITHOTRIPSY Right   . POSTERIOR CERVICAL FUSION/FORAMINOTOMY N/A 02/08/2016   Procedure: CERVICAL FOUR-FIVE  Decompressive Laminectomy and Lateral Mass Fixation;  Surgeon: Kary Kos, MD;  Location: [place] NEURO ORS;  Service: Neurosurgery;  Laterality: N/A;  . TONSILLECTOMY      Vitals:   03/05/17 0802  BP: 124/65  Pulse: 62        Subjective Assessment - 03/05/17 0810    Subjective Patient has been compliant with HEP, no falls over the weekend, occasional stumbles   Pertinent History Patient is a pleasant 83 year old male who had an anterior C4-5 discectomy and fusion which resulted in abnormal fluid collection leading to pt. To develop quadriplegia. Removal of the plate and bony graft was performed with new plate then being inserted. He went to rehabilitation for OT and PT afterwards. Continues to have some balance difficulties. Goes to gym Mon, Wed, Fri work out 45 minutes every day. Try to walk 2 miles a day.    Limitations Walking;Standing;House hold activities;Other (comment)   How long can you sit comfortably? n/a   How long can you stand comfortably? feel more comfortable holding onto something   How long can you walk comfortably? furtniture surfs   Patient Stated Goals improve balance   Currently in Pain? No/denies     SLS measure: R : 10 seconds L : 9 seconds  Head turns while ambulating TREATMENT    Neuromuscular Re-Ed:    Modified tandem stance on airex pad 2x 60 seconds   Modified tandem stance on airex with horizontal head turns 4x60 seconds   Marching in place on airex pad , occasional trunk lean   Squats on on airex x15 with LOB x1, using // bars to steady         Therapeutic Exercise:     Stepping over two consecutive hurdles .    Ambulating in hallway with vertical and horizontal head turns x2 minutes.    Ambulating in hallway with cues for spontaneous direction changes forward, back, left,  right. X3 minutes    Agility ladder laterally two steps in and two steps out with focus on speed. x4 lengths    Agility ladder two steps forward, one step back with focus on speed. X4 lengths   Agility: side step two foot in box x2 Agility: two feet into box on tip toes x 2  Agility: side ways up the bar two in two out with focus on speed x 6 Agility : skiers 2x                              PT Education - 03/05/17 0835    Education provided Yes   Education Details body mechanics for dynamic balance   Person(s) Educated Patient   Methods Explanation;Demonstration   Comprehension Verbalized understanding;Returned demonstration          PT Short Term Goals - 03/05/17 0835      PT SHORT TERM GOAL #1   Title Patient will stand on one leg for 10 seconds without trunk sway demonstrating improved balance for safe mobility   Baseline R: 10 seconds, L 9 seconds with excessive trunk sway   Time 2   Period Weeks   Status Partially Met     PT SHORT TERM GOAL #2   Title Patient will ambulate with head turns for 10 feet without LOB to increase safety while ambulating in community.    Baseline ambulates 10 minutes with horizontal head turns without LOB, 100 ft challenging   Time 2   Period Weeks   Status Achieved           PT Long Term Goals - 02/21/17 1309      PT LONG TERM GOAL #1   Title Patient will increase Berg Balance score by > 6 points (53/56) to demonstrate decreased fall risk during functional activities.   Baseline 9/6: 47/56   Time 8   Period Weeks   Status New   Target Date 04/18/17     PT LONG TERM GOAL #2   Title Patient will increase ABC scale score >80% to demonstrate better functional mobility and better confidence with ADLs.    Baseline 9/6: 71.8%   Time 8   Period Weeks   Status New   Target Date 04/18/17     PT LONG TERM GOAL #3   Title Patient will increase dynamic gait index score to >21/24 as to demonstrate reduced fall  risk and improved dynamic gait balance for better safety with community/home ambulation.    Baseline 9/6: 18/24   Time 8   Period Weeks   Status New   Target Date 04/18/17     PT LONG TERM GOAL #4   Title Patient will deny any falls over past 4 weeks to demonstrate improved safety awareness at home  and work.    Baseline 2 falls   Time 8   Period Weeks   Status New   Target Date 04/18/17               Plan - 03/05/17 0845    Clinical Impression Statement Patient demonstrates occasional LOB with dynamic exercises on agility ladder, challenged by tasks requiring pt. To utilize plantarflexors by maintaining pf position for dynamic position. Patient will continue to benefit form skilled PT intervention for improved balance and strength.    Rehab Potential Fair   Clinical Impairments Affecting Rehab Potential (+) good mindset, motivation, (-) hypertension, hyperlipidemia, type II diabetes, CAD, CABG, Cervical fusion, cancer, graves disease, stroke   PT Frequency 2x / week   PT Duration 8 weeks   PT Treatment/Interventions ADLs/Self Care Home Management;DME Instruction;Traction;Balance training;Therapeutic exercise;Therapeutic activities;Functional mobility training;Stair training;Gait training;Neuromuscular re-education;Cognitive remediation;Patient/family education;Manual techniques;Passive range of motion;Energy conservation;Taping;Vestibular;Visual/perceptual remediation/compensation   PT Next Visit Plan new HEP   PT Home Exercise Plan see sheet   Consulted and Agree with Plan of Care Patient      Patient will benefit from skilled therapeutic intervention in order to improve the following deficits and impairments:  Abnormal gait, Decreased activity tolerance, Decreased balance, Decreased coordination, Decreased endurance, Decreased strength, Postural dysfunction, Improper body mechanics, Decreased safety awareness, Difficulty walking  Visit Diagnosis: History of  falling  Abnormal coordination  Other abnormalities of gait and mobility     Problem List Patient Active Problem List   Diagnosis Date Noted  . Paresthesia 09/04/2016  . Essential hypertension 02/10/2016  . Spinal stenosis, cervical region 02/08/2016  . Surgery, other elective   . Spondylosis, cervical, with myelopathy   . Diabetes mellitus type 2 in nonobese (HCC)   . Coronary artery disease involving native coronary artery of native heart without angina pectoris   . History of CVA (cerebrovascular accident)   . Post-operative pain   . Dysphagia   . Leukocytosis   . Tachypnea   . Quadriparesis ([place]) 02/05/2016  . Myelopathy, spondylogenic, cervical 01/16/2016  . CVA (cerebral infarction) 05/31/2015   Janna Arch, PT, DPT   Janna Arch 03/05/2017, 8:46 AM  [place] MAIN [facility] SERVICES [address] [place], [place], [address] Phone: [phone]   Fax:  [phone]  Name: Gary Craig MRN: [id number] Date of Birth: 1933-10-28

## 2017-03-07 ENCOUNTER — Ambulatory Visit: Payer: Medicare Other

## 2017-03-07 DIAGNOSIS — Z9181 History of falling: Secondary | ICD-10-CM

## 2017-03-07 DIAGNOSIS — R278 Other lack of coordination: Secondary | ICD-10-CM

## 2017-03-07 DIAGNOSIS — R2689 Other abnormalities of gait and mobility: Secondary | ICD-10-CM

## 2017-03-07 NOTE — Therapy (Signed)
[place] MAIN [facility] SERVICES [address] [place], [place], [address] Phone: [phone]   Fax:  [phone]  Physical Therapy Treatment  Patient Details  Name: Gary Craig MRN: [id number] Date of Birth: [birth year] Referring Provider: Dr. Joselyn Arrow  Encounter Date: 03/07/2017      PT End of Session - 03/07/17 1211    Visit Number 5   Number of Visits 16   Date for PT Re-Evaluation 04/18/17   Authorization - Visit Number 5   Authorization - Number of Visits 10   PT Start Time 0945   PT Stop Time 1030   PT Time Calculation (min) 45 min   Equipment Utilized During Treatment Gait belt   Activity Tolerance Patient tolerated treatment well   Behavior During Therapy [facility] for tasks assessed/performed      Past Medical History:  Diagnosis Date  . CAD (coronary artery disease)   . Carotid stenosis   . Diabetes mellitus without complication ([place])   . GERD (gastroesophageal reflux disease)    zantac as needed  . High cholesterol   . History of kidney stones   . Hypothyroidism   . Neuropathy   . S/P CABG (coronary artery bypass graft)   . Skin cancer   . Stroke [facility]) 2012   tia    Past Surgical History:  Procedure Laterality Date  . ANTERIOR CERVICAL DECOMP/DISCECTOMY FUSION N/A 01/16/2016   Procedure: Cervical Four-Five Anterior cervical decompression/diskectomy/fusion;  Surgeon: Kary Kos, MD;  Location: [place] NEURO ORS;  Service: Neurosurgery;  Laterality: N/A;  right side approach  . CERVICAL WOUND DEBRIDEMENT N/A 02/05/2016   Procedure: Exploration of Cervical four - five wound. Removal of cervical four - five instrumentation. Insertion of new cervical four - five instrumentation.;  Surgeon: Leeroy Cha, MD;  Location: [facility] NEURO ORS;  Service: Neurosurgery;  Laterality: N/A;  Exploration of Cervical four - five wound. Removal of cervical four - five instrumentation. Insertion of new cervical four - five instrumentatio  . CORONARY  ARTERY BYPASS GRAFT  11/29/2004  . EAR BIOPSY     skin ca  . LITHOTRIPSY Right   . POSTERIOR CERVICAL FUSION/FORAMINOTOMY N/A 02/08/2016   Procedure: CERVICAL FOUR-FIVE  Decompressive Laminectomy and Lateral Mass Fixation;  Surgeon: Kary Kos, MD;  Location: [place] NEURO ORS;  Service: Neurosurgery;  Laterality: N/A;  . TONSILLECTOMY      There were no vitals filed for this visit.      Subjective Assessment - 03/07/17 0947    Subjective Mowed lawn after last therapy session, had no difficulties. Had no stumbles or false since last session.    Pertinent History Patient is a pleasant 83 year old male who had an anterior C4-5 discectomy and fusion which resulted in abnormal fluid collection leading to pt. To develop quadriplegia. Removal of the plate and bony graft was performed with new plate then being inserted. He went to rehabilitation for OT and PT afterwards. Continues to have some balance difficulties. Goes to gym Mon, Wed, Fri work out 45 minutes every day. Try to walk 2 miles a day.    Limitations Walking;Standing;House hold activities;Other (comment)   How long can you sit comfortably? n/a   How long can you stand comfortably? feel more comfortable holding onto something   How long can you walk comfortably? furtniture surfs   Patient Stated Goals improve balance   Currently in Pain? No/denies      Head turns while ambulating TREATMENT    Neuromuscular Re-Ed:  Cone tapping 3 cones : PT directing color, Patient challenged by crossing body, frequent LOB.   Cone tap forward and backwards 10x each leg, single UE support   Single limb forward reach with opp UE support 10x each leg   Wobble board horizontal hold position 2 minutes , pt. Experienced difficulty  maintaining COM  Wobble board ant/post, hold position 2 minutes, challenging to patient to maintain COM         Therapeutic Exercise:      Ambulating in hallway with cues for spontaneous direction changes forward,  back, left, right. X3 minutes   Ambulating in hallway with cues for looking left, right, up down, 400 ft x horizontal frequent misteps, 200 ft vertical, 200 ft combined all directions.    Sit to stand from low surface    Patient perseverates on tasks that are challenging requiring CGA for safety.        PT Short Term Goals - 03/05/17 0835      PT SHORT TERM GOAL #1   Title Patient will stand on one leg for 10 seconds without trunk sway demonstrating improved balance for safe mobility   Baseline R: 10 seconds, L 9 seconds with excessive trunk sway   Time 2   Period Weeks   Status Partially Met     PT SHORT TERM GOAL #2   Title Patient will ambulate with head turns for 10 feet without LOB to increase safety while ambulating in community.    Baseline ambulates 10 minutes with horizontal head turns without LOB, 100 ft challenging   Time 2   Period Weeks   Status Achieved           PT Long Term Goals - 02/21/17 1309      PT LONG TERM GOAL #1   Title Patient will increase Berg Balance score by > 6 points (53/56) to demonstrate decreased fall risk during functional activities.   Baseline 9/6: 47/56   Time 8   Period Weeks   Status New   Target Date 04/18/17     PT LONG TERM GOAL #2   Title Patient will increase ABC scale score >80% to demonstrate better functional mobility and better confidence with ADLs.    Baseline 9/6: 71.8%   Time 8   Period Weeks   Status New   Target Date 04/18/17     PT LONG TERM GOAL #3   Title Patient will increase dynamic gait index score to >21/24 as to demonstrate reduced fall risk and improved dynamic gait balance for better safety with community/home ambulation.    Baseline 9/6: 18/24   Time 8   Period Weeks   Status New   Target Date 04/18/17     PT LONG TERM GOAL #4   Title Patient will deny any falls over past 4 weeks to demonstrate improved safety awareness at home and work.    Baseline 2 falls   Time 8   Period Weeks    Status New   Target Date 04/18/17               Plan - 03/07/17 1213    Clinical Impression Statement Patient challenged by cross body coordination tasks requiring SLS. Support limb demonstrates weakness and fatigue quickly leading to LOB. Patient perseverates on tasks that are challenging requiring CGA for safety. Patient will continue to benefit from skilled physical therapy for improved balance and decrease fall risk for safe interaction with natural environment.    Rehab Potential  Fair   Clinical Impairments Affecting Rehab Potential (+) good mindset, motivation, (-) hypertension, hyperlipidemia, type II diabetes, CAD, CABG, Cervical fusion, cancer, graves disease, stroke   PT Frequency 2x / week   PT Duration 8 weeks   PT Treatment/Interventions ADLs/Self Care Home Management;DME Instruction;Traction;Balance training;Therapeutic exercise;Therapeutic activities;Functional mobility training;Stair training;Gait training;Neuromuscular re-education;Cognitive remediation;Patient/family education;Manual techniques;Passive range of motion;Energy conservation;Taping;Vestibular;Visual/perceptual remediation/compensation   PT Next Visit Plan dynamic balance, single limb stance   PT Home Exercise Plan see sheet   Consulted and Agree with Plan of Care Patient      Patient will benefit from skilled therapeutic intervention in order to improve the following deficits and impairments:  Abnormal gait, Decreased activity tolerance, Decreased balance, Decreased coordination, Decreased endurance, Decreased strength, Postural dysfunction, Improper body mechanics, Decreased safety awareness, Difficulty walking  Visit Diagnosis: History of falling  Abnormal coordination  Other abnormalities of gait and mobility     Problem List Patient Active Problem List   Diagnosis Date Noted  . Paresthesia 09/04/2016  . Essential hypertension 02/10/2016  . Spinal stenosis, cervical region 02/08/2016  .  Surgery, other elective   . Spondylosis, cervical, with myelopathy   . Diabetes mellitus type 2 in nonobese (HCC)   . Coronary artery disease involving native coronary artery of native heart without angina pectoris   . History of CVA (cerebrovascular accident)   . Post-operative pain   . Dysphagia   . Leukocytosis   . Tachypnea   . Quadriparesis ([place]) 02/05/2016  . Myelopathy, spondylogenic, cervical 01/16/2016  . CVA (cerebral infarction) 05/31/2015  Janna Arch, PT, DPT    Janna Arch 03/07/2017, 12:14 PM  [place] MAIN [facility] SERVICES [address] [place], [place], [address] Phone: [phone]   Fax:  [phone]  Name: Gary Craig MRN: [id number] Date of Birth: [birth year]

## 2017-03-11 ENCOUNTER — Ambulatory Visit: Payer: Medicare Other

## 2017-03-11 VITALS — BP 134/61 | HR 71

## 2017-03-11 DIAGNOSIS — Z9181 History of falling: Secondary | ICD-10-CM | POA: Diagnosis not present

## 2017-03-11 DIAGNOSIS — R278 Other lack of coordination: Secondary | ICD-10-CM

## 2017-03-11 NOTE — Therapy (Signed)
[place] MAIN [facility] SERVICES [address] [place], [place], [address] Phone: [phone]   Fax:  [phone]  Physical Therapy Treatment  Patient Details  Name: Gary Craig MRN: [id number] Date of Birth: [birth year] Referring Provider: Dr. Joselyn Arrow  Encounter Date: 03/11/2017      PT End of Session - 03/11/17 1255    Visit Number 6   Number of Visits 16   Date for PT Re-Evaluation 04/18/17   Authorization - Visit Number 6   Authorization - Number of Visits 10   PT Start Time 7619   PT Stop Time 1200   PT Time Calculation (min) 43 min   Equipment Utilized During Treatment Gait belt   Activity Tolerance Patient tolerated treatment well   Behavior During Therapy [facility] for tasks assessed/performed      Past Medical History:  Diagnosis Date  . CAD (coronary artery disease)   . Carotid stenosis   . Diabetes mellitus without complication ([place])   . GERD (gastroesophageal reflux disease)    zantac as needed  . High cholesterol   . History of kidney stones   . Hypothyroidism   . Neuropathy   . S/P CABG (coronary artery bypass graft)   . Skin cancer   . Stroke [facility]) 2012   tia    Past Surgical History:  Procedure Laterality Date  . ANTERIOR CERVICAL DECOMP/DISCECTOMY FUSION N/A 01/16/2016   Procedure: Cervical Four-Five Anterior cervical decompression/diskectomy/fusion;  Surgeon: Kary Kos, MD;  Location: [place] [facility]URO ORS;  Service: Neurosurgery;  Laterality: N/A;  right side approach  . CERVICAL WOUND DEBRIDEMENT N/A 02/05/2016   Procedure: Exploration of Cervical four - five wound. Removal of cervical four - five instrumentation. Insertion of new cervical four - five instrumentation.;  Surgeon: Leeroy Cha, MD;  Location: [facility];  Service: Neurosurgery;  Laterality: N/A;  Exploration of Cervical four - five wound. Removal of cervical four - five instrumentation. Insertion of new cervical four - five instrumentatio  . CORONARY  ARTERY BYPASS GRAFT  11/29/2004  . EAR BIOPSY     skin ca  . LITHOTRIPSY Right   . POSTERIOR CERVICAL FUSION/FORAMINOTOMY N/A 02/08/2016   Procedure: CERVICAL FOUR-FIVE  Decompressive Laminectomy and Lateral Mass Fixation;  Surgeon: Kary Kos, MD;  Location: [place] NEURO ORS;  Service: Neurosurgery;  Laterality: N/A;  . TONSILLECTOMY      Vitals:   03/11/17 1120  BP: 134/61  Pulse: 71  SpO2: 99%        Subjective Assessment - 03/11/17 1119    Subjective Pt reports he is doing well at this time. He is performing HEP without issue. no specific questions or concerns. Denies pain.    Pertinent History Patient is a pleasant 83 year old male who had an anterior C4-5 discectomy and fusion which resulted in abnormal fluid collection leading to pt. To develop quadriplegia. Removal of the plate and bony graft was performed with new plate then being inserted. He went to rehabilitation for OT and PT afterwards. Continues to have some balance difficulties. Goes to gym Mon, Wed, Fri work out 45 minutes every day. Try to walk 2 miles a day.    Limitations Walking;Standing;House hold activities;Other (comment)   How long can you sit comfortably? n/a   How long can you stand comfortably? feel more comfortable holding onto something   How long can you walk comfortably? furtniture surfs   Patient Stated Goals improve balance   Currently in Pain? No/denies  TREATMENT   Neuromuscular Re-Ed:  Wobble board horizontal orientation maintaining COM with horizontal and then vertical head turns followed by eyes closed static balance x multiple bouts; Wobble board ant/post orientation to patient maintaining COM with horizontal and then vertical head turns followed by eyes closed static balance x multiple bouts; Airex balance tennis ball taps to knock off cone without toppling cone x multiple bouts on each leg; Airex single leg balance trials 10-15s each, multiple bouts on each side; Hallway ambulation  with vertical ball toss to self, lateral ball toss to therapist, and retro ambulation with ball pass over shoulder with return catch on opposite side; BOSU squats with flat side up x 10; Airex balance beam tandem walking x 6 lengths; Airex balance beam side stepping x 2 length, followed by side stepping with horizontal head turns x 4 lengths;  Pt requires CGA/minA+1 for all balance activities during session.                         PT Education - 03/11/17 1119    Education provided Yes   Education Details Exercise form/technique   Person(s) Educated Patient   Methods Explanation   Comprehension Verbalized understanding          PT Short Term Goals - 03/05/17 0835      PT SHORT TERM GOAL #1   Title Patient will stand on one leg for 10 seconds without trunk sway demonstrating improved balance for safe mobility   Baseline R: 10 seconds, L 9 seconds with excessive trunk sway   Time 2   Period Weeks   Status Partially Met     PT SHORT TERM GOAL #2   Title Patient will ambulate with head turns for 10 feet without LOB to increase safety while ambulating in community.    Baseline ambulates 10 minutes with horizontal head turns without LOB, 100 ft challenging   Time 2   Period Weeks   Status Achieved           PT Long Term Goals - 02/21/17 1309      PT LONG TERM GOAL #1   Title Patient will increase Berg Balance score by > 6 points (53/56) to demonstrate decreased fall risk during functional activities.   Baseline 9/6: 47/56   Time 8   Period Weeks   Status New   Target Date 04/18/17     PT LONG TERM GOAL #2   Title Patient will increase ABC scale score >80% to demonstrate better functional mobility and better confidence with ADLs.    Baseline 9/6: 71.8%   Time 8   Period Weeks   Status New   Target Date 04/18/17     PT LONG TERM GOAL #3   Title Patient will increase dynamic gait index score to >21/24 as to demonstrate reduced fall risk and  improved dynamic gait balance for better safety with community/home ambulation.    Baseline 9/6: 18/24   Time 8   Period Weeks   Status New   Target Date 04/18/17     PT LONG TERM GOAL #4   Title Patient will deny any falls over past 4 weeks to demonstrate improved safety awareness at home and work.    Baseline 2 falls   Time 8   Period Weeks   Status New   Target Date 04/18/17               Plan - 03/11/17 1255  Clinical Impression Statement Pt reports continued difficulty with cross body tasks requiring SLS which are part of his HEP. He demonstrates some mild to moderate lateral staggering with head turns during ball toss in hallway. He also struggles in tandem stance on Airex balance beam. Pt encouraged to continue HEP and follow-up as scheduled. He will continue to benefit from skilled PT services to address deficits in balance and encourage return to full activity.    Rehab Potential Fair   Clinical Impairments Affecting Rehab Potential (+) good mindset, motivation, (-) hypertension, hyperlipidemia, type II diabetes, CAD, CABG, Cervical fusion, cancer, graves disease, stroke   PT Frequency 2x / week   PT Duration 8 weeks   PT Treatment/Interventions ADLs/Self Care Home Management;DME Instruction;Traction;Balance training;Therapeutic exercise;Therapeutic activities;Functional mobility training;Stair training;Gait training;Neuromuscular re-education;Cognitive remediation;Patient/family education;Manual techniques;Passive range of motion;Energy conservation;Taping;Vestibular;Visual/perceptual remediation/compensation   PT Next Visit Plan dynamic balance, single limb stance   PT Home Exercise Plan see sheet   Consulted and Agree with Plan of Care Patient      Patient will benefit from skilled therapeutic intervention in order to improve the following deficits and impairments:  Abnormal gait, Decreased activity tolerance, Decreased balance, Decreased coordination, Decreased  endurance, Decreased strength, Postural dysfunction, Improper body mechanics, Decreased safety awareness, Difficulty walking  Visit Diagnosis: History of falling  Abnormal coordination     Problem List Patient Active Problem List   Diagnosis Date Noted  . Paresthesia 09/04/2016  . Essential hypertension 02/10/2016  . Spinal stenosis, cervical region 02/08/2016  . Surgery, other elective   . Spondylosis, cervical, with myelopathy   . Diabetes mellitus type 2 in nonobese (HCC)   . Coronary artery disease involving native coronary artery of native heart without angina pectoris   . History of CVA (cerebrovascular accident)   . Post-operative pain   . Dysphagia   . Leukocytosis   . Tachypnea   . Quadriparesis ([place]) 02/05/2016  . Myelopathy, spondylogenic, cervical 01/16/2016  . CVA (cerebral infarction) 05/31/2015   Phillips Grout PT, DPT   Huprich,Jason 03/11/2017, 1:01 PM  [place] MAIN [facility] SERVICES [address] [place], [place], [address] Phone: [phone]   Fax:  [phone]  Name: Gary Craig MRN: [id number] Date of Birth: 07-11-1933

## 2017-03-13 ENCOUNTER — Ambulatory Visit: Payer: Medicare Other

## 2017-03-13 VITALS — BP 120/57

## 2017-03-13 DIAGNOSIS — Z9181 History of falling: Secondary | ICD-10-CM | POA: Diagnosis not present

## 2017-03-13 DIAGNOSIS — R278 Other lack of coordination: Secondary | ICD-10-CM

## 2017-03-13 DIAGNOSIS — R2689 Other abnormalities of gait and mobility: Secondary | ICD-10-CM

## 2017-03-13 NOTE — Therapy (Signed)
[place] MAIN [facility] SERVICES [address] [place], [place], [address] Phone: [phone]   Fax:  [phone]  Physical Therapy Treatment  Patient Details  Name: ILIA ENGELBERT MRN: [id number] Date of Birth: June 10, 1934 Referring Provider: Dr. Joselyn Arrow  Encounter Date: 03/13/2017      PT End of Session - 03/13/17 1224    Visit Number 7   Number of Visits 16   Date for PT Re-Evaluation 04/18/17   Authorization - Visit Number 7   Authorization - Number of Visits 10   PT Start Time 1115   PT Stop Time 1200   PT Time Calculation (min) 45 min   Equipment Utilized During Treatment Gait belt   Activity Tolerance Patient tolerated treatment well   Behavior During Therapy [facility] for tasks assessed/performed      Past Medical History:  Diagnosis Date  . CAD (coronary artery disease)   . Carotid stenosis   . Diabetes mellitus without complication ([place])   . GERD (gastroesophageal reflux disease)    zantac as needed  . High cholesterol   . History of kidney stones   . Hypothyroidism   . Neuropathy   . S/P CABG (coronary artery bypass graft)   . Skin cancer   . Stroke [facility]) 2012   tia    Past Surgical History:  Procedure Laterality Date  . ANTERIOR CERVICAL DECOMP/DISCECTOMY FUSION N/A 01/16/2016   Procedure: Cervical Four-Five Anterior cervical decompression/diskectomy/fusion;  Surgeon: Kary Kos, MD;  Location: [place] NEURO ORS;  Service: Neurosurgery;  Laterality: N/A;  right side approach  . CERVICAL WOUND DEBRIDEMENT N/A 02/05/2016   Procedure: Exploration of Cervical four - five wound. Removal of cervical four - five instrumentation. Insertion of new cervical four - five instrumentation.;  Surgeon: Leeroy Cha, MD;  Location: [facility];  Service: Neurosurgery;  Laterality: N/A;  Exploration of Cervical four - five wound. Removal of cervical four - five instrumentation. Insertion of new cervical four - five instrumentatio  . CORONARY  ARTERY BYPASS GRAFT  11/29/2004  . EAR BIOPSY     skin ca  . LITHOTRIPSY Right   . POSTERIOR CERVICAL FUSION/FORAMINOTOMY N/A 02/08/2016   Procedure: CERVICAL FOUR-FIVE  Decompressive Laminectomy and Lateral Mass Fixation;  Surgeon: Kary Kos, MD;  Location: [place] NEURO ORS;  Service: Neurosurgery;  Laterality: N/A;  . TONSILLECTOMY      Vitals:   03/13/17 1119  BP: (!) 120/57        Subjective Assessment - 03/13/17 1122    Subjective Has some tingling in hands and feet after working out this morning. Had a stumble since last session but no falls. denies pain.    Pertinent History Patient is a pleasant 83 year old male who had an anterior C4-5 discectomy and fusion which resulted in abnormal fluid collection leading to pt. To develop quadriplegia. Removal of the plate and bony graft was performed with new plate then being inserted. He went to rehabilitation for OT and PT afterwards. Continues to have some balance difficulties. Goes to gym Mon, Wed, Fri work out 45 minutes every day. Try to walk 2 miles a day.    Limitations Walking;Standing;House hold activities;Other (comment)   How long can you sit comfortably? n/a   How long can you stand comfortably? feel more comfortable holding onto something   How long can you walk comfortably? furtniture surfs   Patient Stated Goals improve balance   Currently in Pain? No/denies      Neuromuscular  Re-Ed:   Wobble board horizontal orientation maintaining COM with horizontal and then vertical head turns followed by eyes closed static balance x multiple bouts; Wobble board ant/post orientation to patient maintaining COM with horizontal and then vertical head turns followed by eyes closed static balance x multiple bouts; Toy solder marches in // bars 2x length of bars Hallway ambulation with vertical ball toss to self, lateral ball toss to therapist, and retro ambulation with ball pass over shoulder with return catch on opposite side; BOSU squats  with flat side up x 15, cues for keeping chest up Lunges forward onto bosu ball and back 15x each leg, difficulting maintaining COM.  airex balance beam balloon toss airex balance beam tandem stance 60 seconds each side , occasional LOB airex pad: eyes closed 2x60 seconds ;   Pt requires CGA/minA+1 for all balance activities during session                           PT Education - 03/13/17 1224    Education provided Yes   Education Details stable body mechanics   Person(s) Educated Patient   Methods Explanation;Demonstration   Comprehension Verbalized understanding;Returned demonstration          PT Short Term Goals - 03/05/17 0835      PT SHORT TERM GOAL #1   Title Patient will stand on one leg for 10 seconds without trunk sway demonstrating improved balance for safe mobility   Baseline R: 10 seconds, L 9 seconds with excessive trunk sway   Time 2   Period Weeks   Status Partially Met     PT SHORT TERM GOAL #2   Title Patient will ambulate with head turns for 10 feet without LOB to increase safety while ambulating in community.    Baseline ambulates 10 minutes with horizontal head turns without LOB, 100 ft challenging   Time 2   Period Weeks   Status Achieved           PT Long Term Goals - 02/21/17 1309      PT LONG TERM GOAL #1   Title Patient will increase Berg Balance score by > 6 points (53/56) to demonstrate decreased fall risk during functional activities.   Baseline 9/6: 47/56   Time 8   Period Weeks   Status New   Target Date 04/18/17     PT LONG TERM GOAL #2   Title Patient will increase ABC scale score >80% to demonstrate better functional mobility and better confidence with ADLs.    Baseline 9/6: 71.8%   Time 8   Period Weeks   Status New   Target Date 04/18/17     PT LONG TERM GOAL #3   Title Patient will increase dynamic gait index score to >21/24 as to demonstrate reduced fall risk and improved dynamic gait balance for  better safety with community/home ambulation.    Baseline 9/6: 18/24   Time 8   Period Weeks   Status New   Target Date 04/18/17     PT LONG TERM GOAL #4   Title Patient will deny any falls over past 4 weeks to demonstrate improved safety awareness at home and work.    Baseline 2 falls   Time 8   Period Weeks   Status New   Target Date 04/18/17               Plan - 03/13/17 1227    Clinical Impression  Statement Patient challenged by tasks requiring multiple step sequencing due to difficulty maintaining orientation to task. Lateral missteps occurred with head turns during dynamic gait balance activities. Patient will continue to benefit from skilled PT services to address deficits in balance and encourage return to full activity.    Rehab Potential Fair   Clinical Impairments Affecting Rehab Potential (+) good mindset, motivation, (-) hypertension, hyperlipidemia, type II diabetes, CAD, CABG, Cervical fusion, cancer, graves disease, stroke   PT Frequency 2x / week   PT Duration 8 weeks   PT Treatment/Interventions ADLs/Self Care Home Management;DME Instruction;Traction;Balance training;Therapeutic exercise;Therapeutic activities;Functional mobility training;Stair training;Gait training;Neuromuscular re-education;Cognitive remediation;Patient/family education;Manual techniques;Passive range of motion;Energy conservation;Taping;Vestibular;Visual/perceptual remediation/compensation   PT Next Visit Plan dynamic balance, single limb stance   PT Home Exercise Plan see sheet   Consulted and Agree with Plan of Care Patient      Patient will benefit from skilled therapeutic intervention in order to improve the following deficits and impairments:  Abnormal gait, Decreased activity tolerance, Decreased balance, Decreased coordination, Decreased endurance, Decreased strength, Postural dysfunction, Improper body mechanics, Decreased safety awareness, Difficulty walking  Visit  Diagnosis: History of falling  Abnormal coordination  Other abnormalities of gait and mobility     Problem List Patient Active Problem List   Diagnosis Date Noted  . Paresthesia 09/04/2016  . Essential hypertension 02/10/2016  . Spinal stenosis, cervical region 02/08/2016  . Surgery, other elective   . Spondylosis, cervical, with myelopathy   . Diabetes mellitus type 2 in nonobese (HCC)   . Coronary artery disease involving native coronary artery of native heart without angina pectoris   . History of CVA (cerebrovascular accident)   . Post-operative pain   . Dysphagia   . Leukocytosis   . Tachypnea   . Quadriparesis ([place]) 02/05/2016  . Myelopathy, spondylogenic, cervical 01/16/2016  . CVA (cerebral infarction) 05/31/2015   Janna Arch, PT, DPT   Janna Arch 03/13/2017, 12:28 PM  [place] MAIN [facility] SERVICES [address] [place], [place], [address] Phone: [phone]   Fax:  [phone]  Name: RYLE BUSCEMI MRN: [id number] Date of Birth: [birth year]

## 2017-03-18 ENCOUNTER — Ambulatory Visit: Payer: Medicare Other | Attending: Neurology

## 2017-03-18 DIAGNOSIS — R2689 Other abnormalities of gait and mobility: Secondary | ICD-10-CM | POA: Diagnosis present

## 2017-03-18 DIAGNOSIS — R278 Other lack of coordination: Secondary | ICD-10-CM | POA: Insufficient documentation

## 2017-03-18 DIAGNOSIS — Z9181 History of falling: Secondary | ICD-10-CM | POA: Diagnosis present

## 2017-03-18 NOTE — Therapy (Signed)
[place] MAIN [facility] SERVICES [address] [place], [place], [address] Phone: [phone]   Fax:  [phone]  Physical Therapy Treatment  Patient Details  Name: Gary Craig MRN: [id number] Date of Birth: 01-15-1934 Referring Provider: Dr. Joselyn Arrow  Encounter Date: 03/18/2017      PT End of Session - 03/18/17 1211    Visit Number 8   Number of Visits 16   Date for PT Re-Evaluation 04/18/17   Authorization - Visit Number 8   Authorization - Number of Visits 10   PT Start Time 1115   PT Stop Time 1201   PT Time Calculation (min) 46 min   Equipment Utilized During Treatment Gait belt   Activity Tolerance Patient tolerated treatment well   Behavior During Therapy [facility] for tasks assessed/performed      Past Medical History:  Diagnosis Date  . CAD (coronary artery disease)   . Carotid stenosis   . Diabetes mellitus without complication ([place])   . GERD (gastroesophageal reflux disease)    zantac as needed  . High cholesterol   . History of kidney stones   . Hypothyroidism   . Neuropathy   . S/P CABG (coronary artery bypass graft)   . Skin cancer   . Stroke [facility]) 2012   tia    Past Surgical History:  Procedure Laterality Date  . ANTERIOR CERVICAL DECOMP/DISCECTOMY FUSION N/A 01/16/2016   Procedure: Cervical Four-Five Anterior cervical decompression/diskectomy/fusion;  Surgeon: Kary Kos, MD;  Location: [place] [facility]S;  Service: Neurosurgery;  Laterality: N/A;  right side approach  . CERVICAL WOUND DEBRIDEMENT N/A 02/05/2016   Procedure: Exploration of Cervical four - five wound. Removal of cervical four - five instrumentation. Insertion of new cervical four - five instrumentation.;  Surgeon: Leeroy Cha, MD;  Location: [facility];  Service: Neurosurgery;  Laterality: N/A;  Exploration of Cervical four - five wound. Removal of cervical four - five instrumentation. Insertion of new cervical four - five instrumentatio  . CORONARY  ARTERY BYPASS GRAFT  11/29/2004  . EAR BIOPSY     skin ca  . LITHOTRIPSY Right   . POSTERIOR CERVICAL FUSION/FORAMINOTOMY N/A 02/08/2016   Procedure: CERVICAL FOUR-FIVE  Decompressive Laminectomy and Lateral Mass Fixation;  Surgeon: Kary Kos, MD;  Location: [place] NEURO ORS;  Service: Neurosurgery;  Laterality: N/A;  . TONSILLECTOMY      There were no vitals filed for this visit.      Subjective Assessment - 03/18/17 1120    Subjective Patient went to the gym this morning, reports some difficulty with steps. Hits arms on post, reports compliance with HEP.    Pertinent History Patient is a pleasant 83 year old male who had an anterior C4-5 discectomy and fusion which resulted in abnormal fluid collection leading to pt. To develop quadriplegia. Removal of the plate and bony graft was performed with new plate then being inserted. He went to rehabilitation for OT and PT afterwards. Continues to have some balance difficulties. Goes to gym Mon, Wed, Fri work out 45 minutes every day. Try to walk 2 miles a day.    Limitations Walking;Standing;House hold activities;Other (comment)   How long can you sit comfortably? n/a   How long can you stand comfortably? feel more comfortable holding onto something   How long can you walk comfortably? furtniture surfs   Patient Stated Goals improve balance   Currently in Pain? No/denies      Neuro Re-ed: airex pad eyes closed, posterior  lean 2x 60 seconds  Ambulated outside across unstable surfaces, brick, grass, cement.  Negotiating stairwells x 2 ascend/descend reciprocal steps on UE support, excessive posterior trunk lean Step over and back over orange hurdle 15x each leg, no UE support  airex pad: step over and back hurdle onto airex pad. 20x each leg, increased trunk sway  Aired pad: side step over orange hurdle and back onto airex pad 20x, occasional difficulty placing foot.  Side lunge onto green dynadisc 10x each leg,  Forward lunge onto green dynadisc  12x each leg, difficulty with LLE due to RLE weakness/imbalance.                               PT Education - 03/18/17 1210    Education provided Yes   Education Details balance progression, stair negotiation   Person(s) Educated Patient   Methods Explanation;Demonstration;Verbal cues   Comprehension Verbalized understanding;Returned demonstration          PT Short Term Goals - 03/05/17 0835      PT SHORT TERM GOAL #1   Title Patient will stand on one leg for 10 seconds without trunk sway demonstrating improved balance for safe mobility   Baseline R: 10 seconds, L 9 seconds with excessive trunk sway   Time 2   Period Weeks   Status Partially Met     PT SHORT TERM GOAL #2   Title Patient will ambulate with head turns for 10 feet without LOB to increase safety while ambulating in community.    Baseline ambulates 10 minutes with horizontal head turns without LOB, 100 ft challenging   Time 2   Period Weeks   Status Achieved           PT Long Term Goals - 02/21/17 1309      PT LONG TERM GOAL #1   Title Patient will increase Berg Balance score by > 6 points (53/56) to demonstrate decreased fall risk during functional activities.   Baseline 9/6: 47/56   Time 8   Period Weeks   Status New   Target Date 04/18/17     PT LONG TERM GOAL #2   Title Patient will increase ABC scale score >80% to demonstrate better functional mobility and better confidence with ADLs.    Baseline 9/6: 71.8%   Time 8   Period Weeks   Status New   Target Date 04/18/17     PT LONG TERM GOAL #3   Title Patient will increase dynamic gait index score to >21/24 as to demonstrate reduced fall risk and improved dynamic gait balance for better safety with community/home ambulation.    Baseline 9/6: 18/24   Time 8   Period Weeks   Status New   Target Date 04/18/17     PT LONG TERM GOAL #4   Title Patient will deny any falls over past 4 weeks to demonstrate improved safety  awareness at home and work.    Baseline 2 falls   Time 8   Period Weeks   Status New   Target Date 04/18/17               Plan - 03/18/17 1212    Clinical Impression Statement Patient is challenged by lack of awareness of surroundings and limited attention span towards task leading to occasional LOB and missteps into objects. Focus on task orientation/body mechanics when negotiating stairs was performed with patient demonstrating understanding. Ambulating outside over  unstable surfaces was performed with occasional cueing for task orientation. Patient will continue to benefit from skilled physical therapy to address deficits in balance and encourage return to full activity.    Rehab Potential Fair   Clinical Impairments Affecting Rehab Potential (+) good mindset, motivation, (-) hypertension, hyperlipidemia, type II diabetes, CAD, CABG, Cervical fusion, cancer, graves disease, stroke   PT Frequency 2x / week   PT Duration 8 weeks   PT Treatment/Interventions ADLs/Self Care Home Management;DME Instruction;Traction;Balance training;Therapeutic exercise;Therapeutic activities;Functional mobility training;Stair training;Gait training;Neuromuscular re-education;Cognitive remediation;Patient/family education;Manual techniques;Passive range of motion;Energy conservation;Taping;Vestibular;Visual/perceptual remediation/compensation   PT Next Visit Plan recheck goals   PT Home Exercise Plan see sheet   Consulted and Agree with Plan of Care Patient      Patient will benefit from skilled therapeutic intervention in order to improve the following deficits and impairments:  Abnormal gait, Decreased activity tolerance, Decreased balance, Decreased coordination, Decreased endurance, Decreased strength, Postural dysfunction, Improper body mechanics, Decreased safety awareness, Difficulty walking  Visit Diagnosis: History of falling  Abnormal coordination  Other abnormalities of gait and  mobility     Problem List Patient Active Problem List   Diagnosis Date Noted  . Paresthesia 09/04/2016  . Essential hypertension 02/10/2016  . Spinal stenosis, cervical region 02/08/2016  . Surgery, other elective   . Spondylosis, cervical, with myelopathy   . Diabetes mellitus type 2 in nonobese (HCC)   . Coronary artery disease involving native coronary artery of native heart without angina pectoris   . History of CVA (cerebrovascular accident)   . Post-operative pain   . Dysphagia   . Leukocytosis   . Tachypnea   . Quadriparesis ([place]) 02/05/2016  . Myelopathy, spondylogenic, cervical 01/16/2016  . CVA (cerebral infarction) 05/31/2015  Janna Arch, PT, DPT   Janna Arch 03/18/2017, 12:13 PM  [place] MAIN [facility] SERVICES [address] [place], [place], [address] Phone: [phone]   Fax:  [phone]  Name: Gary Craig MRN: [id number] Date of Birth: Nov 10, 1933

## 2017-03-20 ENCOUNTER — Ambulatory Visit: Payer: Medicare Other

## 2017-03-20 DIAGNOSIS — R278 Other lack of coordination: Secondary | ICD-10-CM

## 2017-03-20 DIAGNOSIS — Z9181 History of falling: Secondary | ICD-10-CM | POA: Diagnosis not present

## 2017-03-20 DIAGNOSIS — R2689 Other abnormalities of gait and mobility: Secondary | ICD-10-CM

## 2017-03-20 NOTE — Therapy (Signed)
[place] MAIN [facility] SERVICES [address] [place], [place], [address] Phone: [phone]   Fax:  [phone]  Physical Therapy Treatment  Patient Details  Name: Gary Craig MRN: [id number] Date of Birth: 1934/04/15 Referring Provider: Dr. Joselyn Arrow  Encounter Date: 03/20/2017      PT End of Session - 03/20/17 1735    Visit Number 9   Number of Visits 16   Date for PT Re-Evaluation 04/18/17   Authorization - Visit Number 9   Authorization - Number of Visits 10   PT Start Time 1115   PT Stop Time 1158   PT Time Calculation (min) 43 min   Equipment Utilized During Treatment Gait belt   Activity Tolerance Patient tolerated treatment well   Behavior During Therapy [facility] for tasks assessed/performed      Past Medical History:  Diagnosis Date  . CAD (coronary artery disease)   . Carotid stenosis   . Diabetes mellitus without complication ([place])   . GERD (gastroesophageal reflux disease)    zantac as needed  . High cholesterol   . History of kidney stones   . Hypothyroidism   . Neuropathy   . S/P CABG (coronary artery bypass graft)   . Skin cancer   . Stroke [facility]) 2012   tia    Past Surgical History:  Procedure Laterality Date  . ANTERIOR CERVICAL DECOMP/DISCECTOMY FUSION N/A 01/16/2016   Procedure: Cervical Four-Five Anterior cervical decompression/diskectomy/fusion;  Surgeon: Kary Kos, MD;  Location: [place] NEURO ORS;  Service: Neurosurgery;  Laterality: N/A;  right side approach  . CERVICAL WOUND DEBRIDEMENT N/A 02/05/2016   Procedure: Exploration of Cervical four - five wound. Removal of cervical four - five instrumentation. Insertion of new cervical four - five instrumentation.;  Surgeon: Leeroy Cha, MD;  Location: [facility];  Service: Neurosurgery;  Laterality: N/A;  Exploration of Cervical four - five wound. Removal of cervical four - five instrumentation. Insertion of new cervical four - five instrumentatio  . CORONARY  ARTERY BYPASS GRAFT  11/29/2004  . EAR BIOPSY     skin ca  . LITHOTRIPSY Right   . POSTERIOR CERVICAL FUSION/FORAMINOTOMY N/A 02/08/2016   Procedure: CERVICAL FOUR-FIVE  Decompressive Laminectomy and Lateral Mass Fixation;  Surgeon: Kary Kos, MD;  Location: [place] NEURO ORS;  Service: Neurosurgery;  Laterality: N/A;  . TONSILLECTOMY      There were no vitals filed for this visit.      Subjective Assessment - 03/20/17 1119    Subjective Patient reports feeling good today. Won a Research scientist (life sciences) since last session at a drawing at the hospital. Reports compliance with HEP and exercise   Pertinent History Patient is a pleasant 83 year old male who had an anterior C4-5 discectomy and fusion which resulted in abnormal fluid collection leading to pt. To develop quadriplegia. Removal of the plate and bony graft was performed with new plate then being inserted. He went to rehabilitation for OT and PT afterwards. Continues to have some balance difficulties. Goes to gym Mon, Wed, Fri work out 45 minutes every day. Try to walk 2 miles a day.    Limitations Walking;Standing;House hold activities;Other (comment)   How long can you sit comfortably? n/a   How long can you stand comfortably? feel more comfortable holding onto something   How long can you walk comfortably? furtniture surfs   Patient Stated Goals improve balance   Currently in Pain? No/denies      Sls >20 seconds bilaterally  Ambulate with head turns BERG 55/56 ABC: 77.8% DGI:22/24 Deny any falls  Ambulate 1500 ft outside across dynamic surfaces, up and down unstable surfaces and in circles around plant feeders with no LOB, stumbling or missteps   Airex pad eyes closed : 40 sec no LOB Eyes open horizontal head turns no LOB 60 seconds       [facility] PT Assessment - 03/20/17 0001      Berg Balance Test   Sit to Stand Able to stand without using hands and stabilize independently   Standing Unsupported Able to stand safely 2 minutes   Sitting  with Back Unsupported but Feet Supported on Floor or Stool Able to sit safely and securely 2 minutes   Stand to Sit Sits safely with minimal use of hands   Transfers Able to transfer safely, minor use of hands   Standing Unsupported with Eyes Closed Able to stand 10 seconds safely   Standing Ubsupported with Feet Together Able to place feet together independently and stand 1 minute safely   From Standing, Reach Forward with Outstretched Arm Can reach confidently >25 cm (10")   From Standing Position, Pick up Object from Floor Able to pick up shoe safely and easily   From Standing Position, Turn to Look Behind Over each Shoulder Looks behind from both sides and weight shifts well   Turn 360 Degrees Able to turn 360 degrees safely one side only in 4 seconds or less   Standing Unsupported, Alternately Place Feet on Step/Stool Able to stand independently and safely and complete 8 steps in 20 seconds   Standing Unsupported, One Foot in Front Able to place foot tandem independently and hold 30 seconds   Standing on One Leg Able to lift leg independently and hold > 10 seconds   Total Score 55     Dynamic Gait Index   Level Surface Normal   Change in Gait Speed Normal   Gait with Horizontal Head Turns Normal   Gait with Vertical Head Turns Normal   Gait and Pivot Turn Mild Impairment   Step Over Obstacle Mild Impairment   Step Around Obstacles Normal   Steps Normal   Total Score 22                             PT Education - 03/20/17 1735    Education provided Yes   Education Details discharge education   Person(s) Educated Patient   Methods Explanation   Comprehension Verbalized understanding          PT Short Term Goals - 03/20/17 1737      PT SHORT TERM GOAL #1   Title Patient will stand on one leg for 10 seconds without trunk sway demonstrating improved balance for safe mobility   Baseline R: 10 seconds, L 9 seconds with excessive trunk sway 10/3: >20 seconds  bilat   Time 2   Period Weeks   Status Achieved     PT SHORT TERM GOAL #2   Title Patient will ambulate with head turns for 10 feet without LOB to increase safety while ambulating in community.    Baseline ambulates 10 minutes with horizontal head turns without LOB, 100 ft challenging   Time 2   Period Weeks   Status Achieved           PT Long Term Goals - 03/20/17 1120      PT LONG TERM GOAL #1   Title  Patient will increase Berg Balance score by > 6 points (53/56) to demonstrate decreased fall risk during functional activities.   Baseline 9/6: 47/56 04-09-2023: 55/56   Time 8   Period Weeks   Status Achieved     PT LONG TERM GOAL #2   Title Patient will increase ABC scale score >80% to demonstrate better functional mobility and better confidence with ADLs.    Baseline 9/6: 71.8% 04-09-23: 77.8%   Time 8   Period Weeks   Status On-going     PT LONG TERM GOAL #3   Title Patient will increase dynamic gait index score to >21/24 as to demonstrate reduced fall risk and improved dynamic gait balance for better safety with community/home ambulation.    Baseline 9/6: 18/24 Apr 09, 2023: 22/24   Time 8   Period Weeks   Status Achieved     PT LONG TERM GOAL #4   Title Patient will deny any falls over past 4 weeks to demonstrate improved safety awareness at home and work.    Baseline reports no falls   Time 8   Period Weeks   Status Achieved               Plan - 04-08-2017 1738    Clinical Impression Statement Patient made progress/met most of goals at this time. Is ready for discharge. SLS >20 seconds bilaterally, BERG=55/56, ABC=78%, DGI=22/24, and no falls. I will be happy to see patient again in the future.    Rehab Potential Fair   Clinical Impairments Affecting Rehab Potential (+) good mindset, motivation, (-) hypertension, hyperlipidemia, type II diabetes, CAD, CABG, Cervical fusion, cancer, graves disease, stroke   PT Frequency 2x / week   PT Duration 8 weeks   PT  Treatment/Interventions ADLs/Self Care Home Management;DME Instruction;Traction;Balance training;Therapeutic exercise;Therapeutic activities;Functional mobility training;Stair training;Gait training;Neuromuscular re-education;Cognitive remediation;Patient/family education;Manual techniques;Passive range of motion;Energy conservation;Taping;Vestibular;Visual/perceptual remediation/compensation   PT Home Exercise Plan see sheet   Consulted and Agree with Plan of Care Patient      Patient will benefit from skilled therapeutic intervention in order to improve the following deficits and impairments:  Abnormal gait, Decreased activity tolerance, Decreased balance, Decreased coordination, Decreased endurance, Decreased strength, Postural dysfunction, Improper body mechanics, Decreased safety awareness, Difficulty walking  Visit Diagnosis: History of falling  Abnormal coordination  Other abnormalities of gait and mobility       G-Codes - 2017-04-08 1739    Functional Assessment Tool Used (Outpatient Only) ABC,DGI, 5xSTS, BERG, clinical judgement,   Functional Limitation Mobility: Walking and moving around   Mobility: Walking and Moving Around Current Status [phone]) At least 1 percent but less than 20 percent impaired, limited or restricted   Mobility: Walking and Moving Around Goal Status [phone]) At least 1 percent but less than 20 percent impaired, limited or restricted   Mobility: Walking and Moving Around Discharge Status [phone]) At least 1 percent but less than 20 percent impaired, limited or restricted      Problem List Patient Active Problem List   Diagnosis Date Noted  . Paresthesia 09/04/2016  . Essential hypertension 02/10/2016  . Spinal stenosis, cervical region 02/08/2016  . Surgery, other elective   . Spondylosis, cervical, with myelopathy   . Diabetes mellitus type 2 in nonobese (HCC)   . Coronary artery disease involving native coronary artery of native heart without angina  pectoris   . History of CVA (cerebrovascular accident)   . Post-operative pain   . Dysphagia   . Leukocytosis   . Tachypnea   .  Quadriparesis ([place]) 02/05/2016  . Myelopathy, spondylogenic, cervical 01/16/2016  . CVA (cerebral infarction) 05/31/2015   Janna Arch, PT, DPT   Janna Arch 03/20/2017, 5:40 PM  [place] MAIN [facility] SERVICES [address] [place], [place], [address] Phone: [phone]   Fax:  [phone]  Name: Gary Craig MRN: [id number] Date of Birth: 21-Jul-1933

## 2017-03-25 ENCOUNTER — Ambulatory Visit: Payer: Medicare Other

## 2017-03-27 ENCOUNTER — Ambulatory Visit: Payer: Medicare Other

## 2018-02-07 IMAGING — MR MR CERVICAL SPINE W/O CM
5 series · 34 of 48 positions shown · non-contrast
Comparison: 05/31/2015.

CLINICAL DATA: 81-year-old male with tingling in both arms and left
leg. Intermittent symptoms for 5 days. Initial encounter.

EXAM:
MRI CERVICAL SPINE WITHOUT CONTRAST
TECHNIQUE: Multiplanar, multisequence MR imaging of the cervical spine was
performed. No intravenous contrast was administered.

[Series 3: T2 · sagittal · 3.0mm · 0.70mm/px · 7 of 15 slices shown (1 of 2)]
[im 1/15]
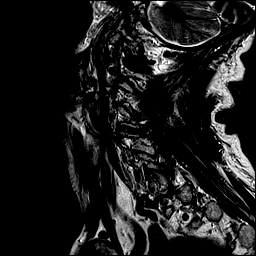
[im 3/15]
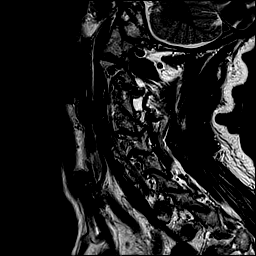
[im 5/15]
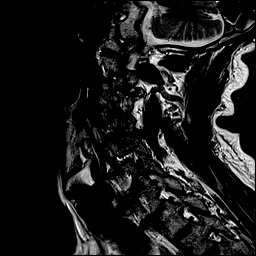
[im 8/15]
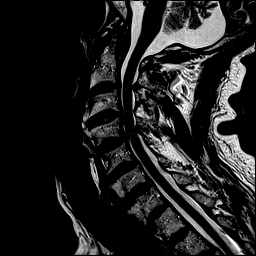
[im 10/15]
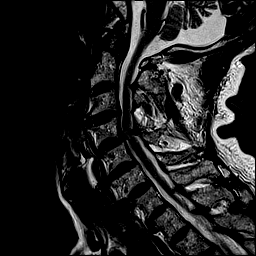
[im 12/15]
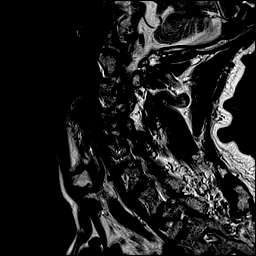
[im 15/15]
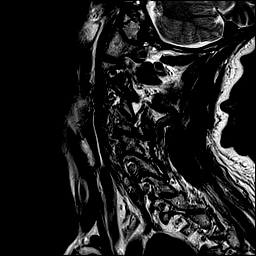

[Series 4: T1 · sagittal · 3.0mm · 0.70mm/px · 7 of 15 slices shown]
[im 1/15]
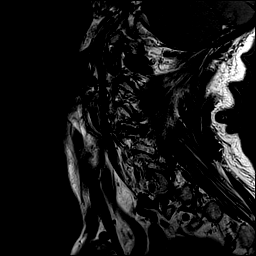
[im 3/15]
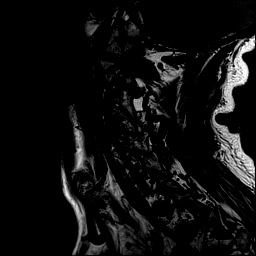
[im 5/15]
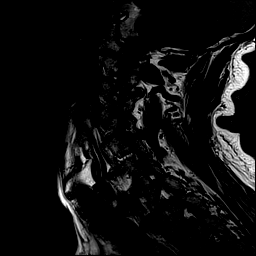
[im 8/15]
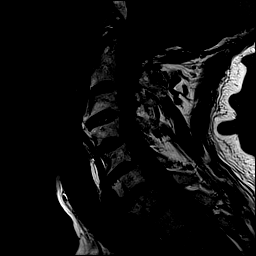
[im 10/15]
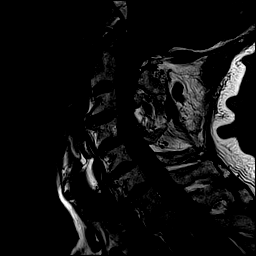
[im 12/15]
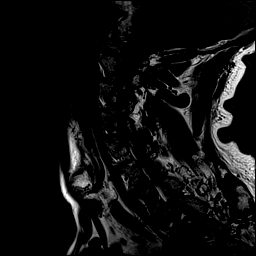
[im 15/15]
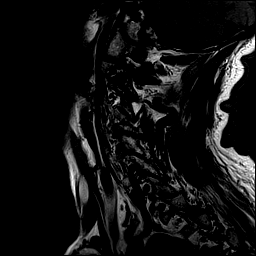

[Series 5: STIR · sagittal · 3.0mm · 0.35mm/px · 6 of 15 slices shown]
[im 1/15]
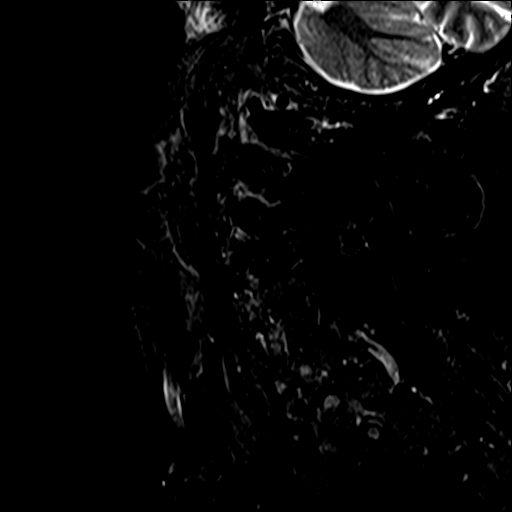
[im 3/15]
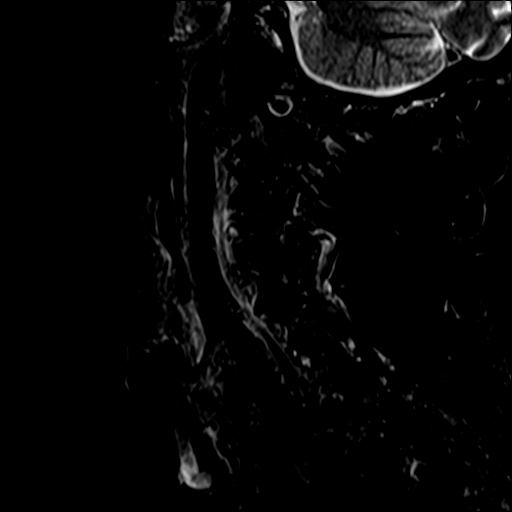
[im 6/15]
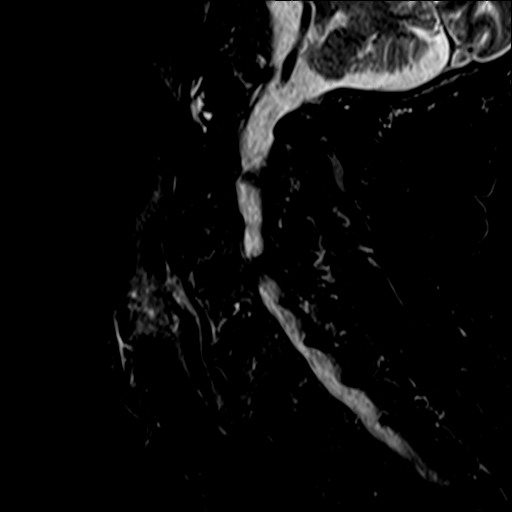
[im 9/15]
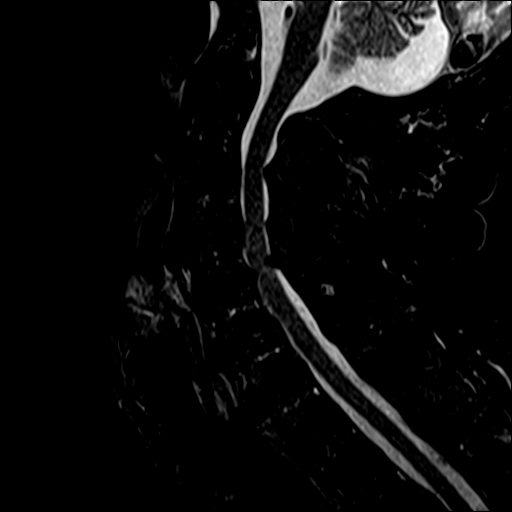
[im 12/15]
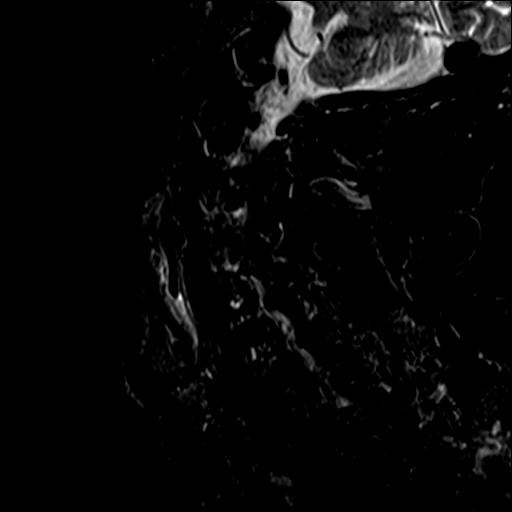
[im 15/15]
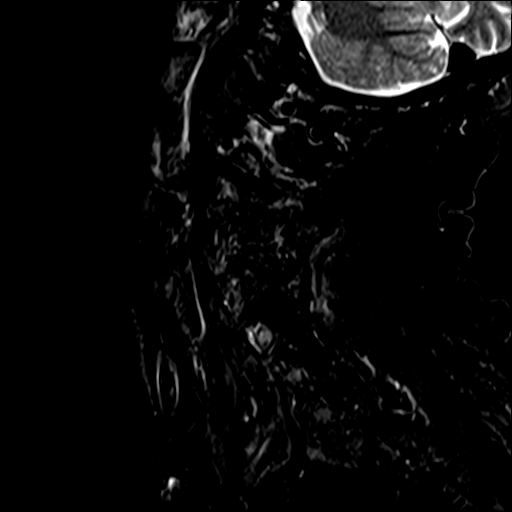

[Series 6: T2 · axial · 3.0mm · 0.70mm/px · z∈[-294,-176]mm · 8 of 33 slices shown (2 of 2)]
[im 1/33]
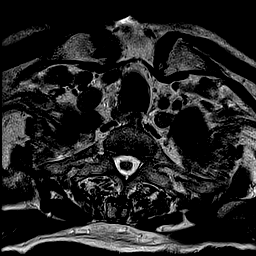
[im 5/33]
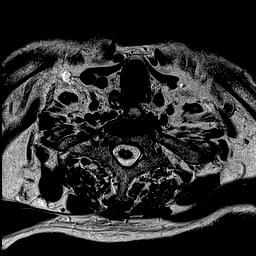
[im 10/33]
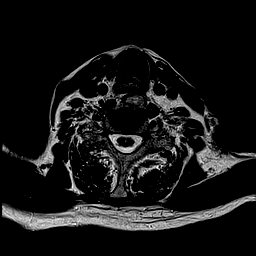
[im 15/33]
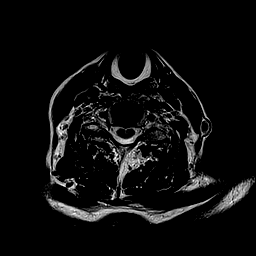
[im 18/33]
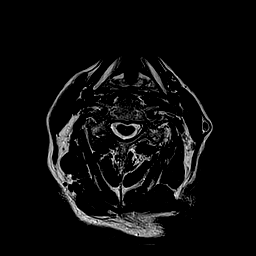
[im 23/33]
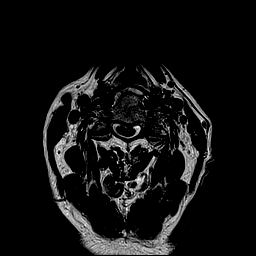
[im 28/33]
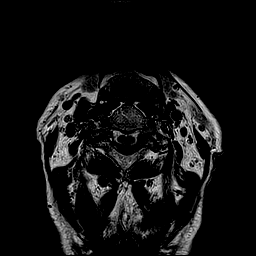
[im 33/33]
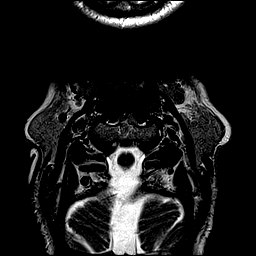

[Series 7: mpgr ax · axial · 3.0mm · 0.35mm/px · z∈[-294,-213]mm · 6 of 33 slices shown]
[im 1/33]
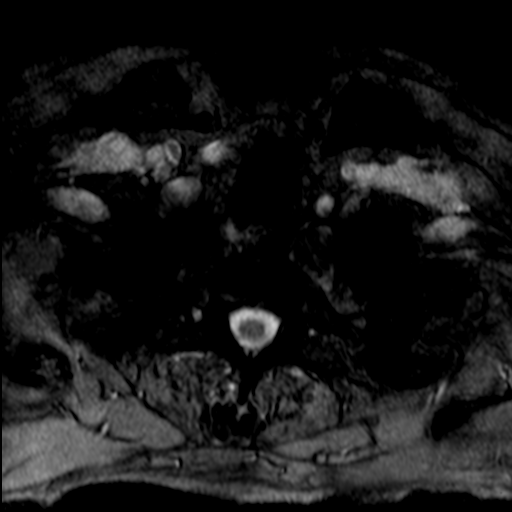
[im 5/33]
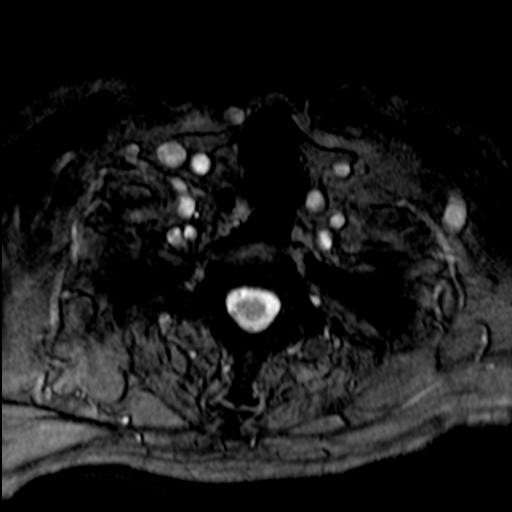
[im 10/33]
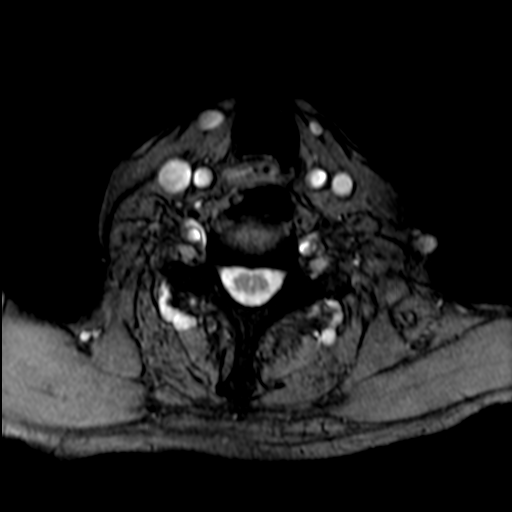
[im 15/33]
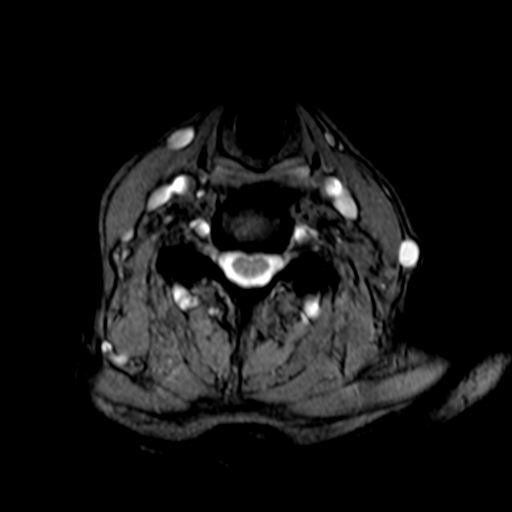
[im 18/33]
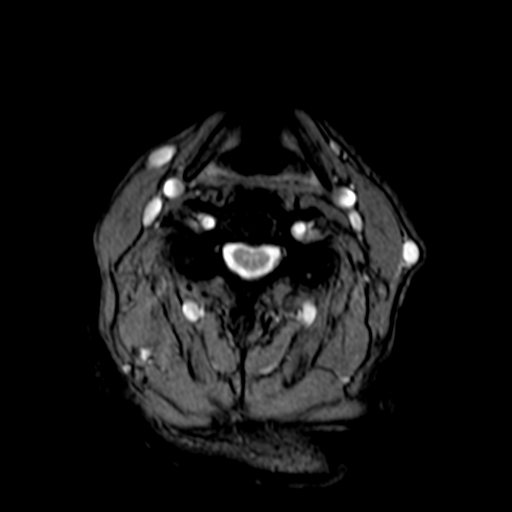
[im 23/33]
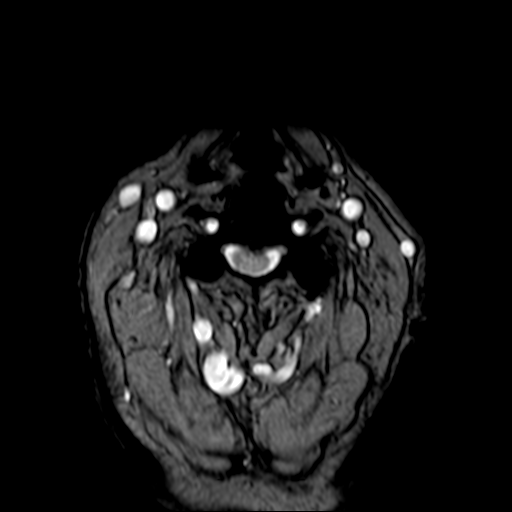

[34 of 48 positions shown; findings below may reference images not displayed]

FINDINGS: Alignment: Minimal anterior slip C4, C5 and C6.

Vertebrae: No focal worrisome abnormality.

Cord: Focal cord signal abnormality C4-5 suggestive of
myelomalacia/edema. Question tiny syrinx C5 level extending to T1
level.

Posterior Fossa, vertebral arteries, paraspinal tissues: Negative.

Disc levels:

C2-3: Bilateral facet degenerative changes. Buckling posterior
ligament. Bulge. Mild circumferential thecal sac narrowing. Mild to
moderate bilateral foraminal narrowing.

C3-4: Bulge/protrusion left paracentral position. Slight flattening
left aspect of the cord. Facet degenerative changes. Uncinate
hypertrophy. Moderate bilateral foraminal narrowing.

C4-5: Prominent bilateral facet degenerative changes greater on
left. Bulge with central protrusion slightly greater to left.
Minimal anterior slip C4. Multifactorial moderate to marked spinal
stenosis greater on left. Cord compression with slight increased
signal in the cord suggesting myelomalacia/edema. Moderate to marked
foraminal narrowing greater on the left.

C5-6: Minimal anterior slip C5. Small central bulge. Slight
narrowing ventral thecal sac.

C6-7: Facet degenerative changes. Minimal anterior slip C6. Bulge.
Slight narrowing ventral thecal sac.

C7-T1:  Minimal anterior slips C7.  Minimal bulge.

T1-2:  Minimal bulge.
IMPRESSION: Exam is relatively similar to the prior examination with cervical
spondylotic changes, spinal stenosis and cord compression most
notable C4-5 level. Slight increased signal within the cord at this
level is once again noted which may represent myelomalacia or edema.

Less notable but significant degenerative changes at surrounding
levels as detailed above.

Please see above for further detail.

## 2018-02-07 IMAGING — MR MR HEAD W/O CM
10 series · 48 of 48 positions shown · non-contrast
Comparison: 05/31/2015.

CLINICAL DATA: 81-year-old male with numbness and tingling both
arms and left leg. Initial encounter.

EXAM:
MRI HEAD WITHOUT CONTRAST
TECHNIQUE: Multiplanar, multiecho pulse sequences of the brain and surrounding
structures were obtained without intravenous contrast.

[Series 2: T1 · sagittal · 5.0mm · 0.45mm/px · 4 of 29 slices shown (1 of 2)]
[im 1/29]
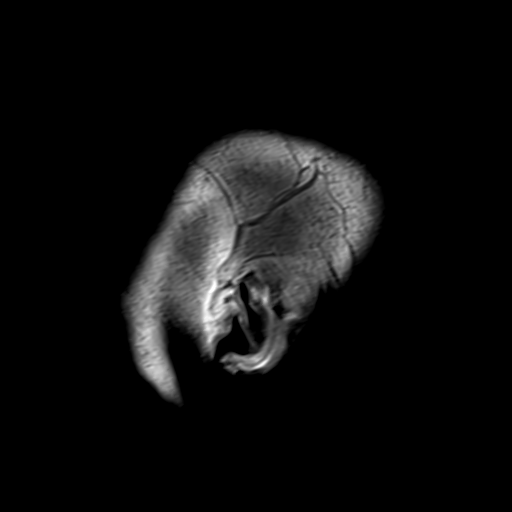
[im 10/29]
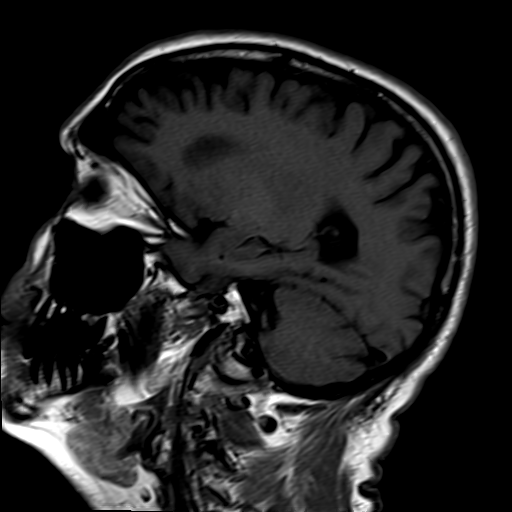
[im 19/29]
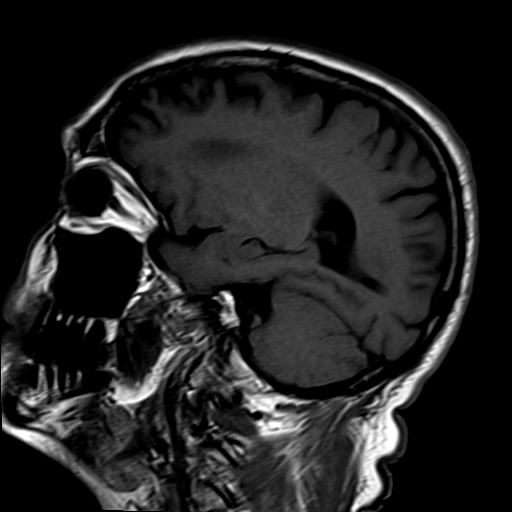
[im 29/29]
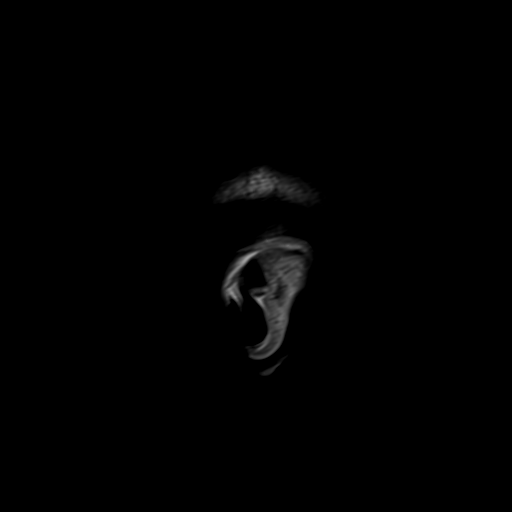

[Series 4: DWI · axial · 3.0mm · 1.80mm/px · z∈[-66,+74]mm · 6 of 55 slices shown (1 of 2)]
[im 1/55]
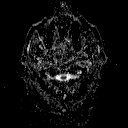
[im 11/55]
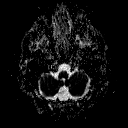
[im 22/55]
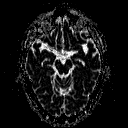
[im 33/55]
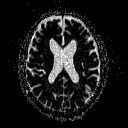
[im 44/55]
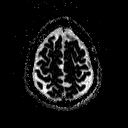
[im 55/55]
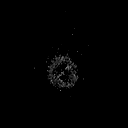

[Series 6: DWI · coronal · 3.0mm · 1.80mm/px · 6 of 51 slices shown (2 of 2)]
[im 1/51]
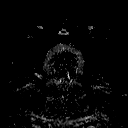
[im 11/51]
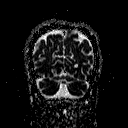
[im 21/51]
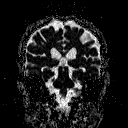
[im 31/51]
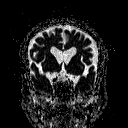
[im 41/51]
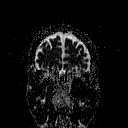
[im 51/51]
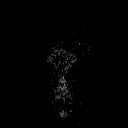

[Series 7: T2 · axial · 5.0mm · 0.90mm/px · z∈[-69,+77]mm · 3 of 27 slices shown (1 of 3)]
[im 1/27]
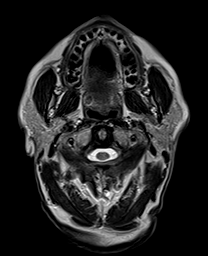
[im 14/27]
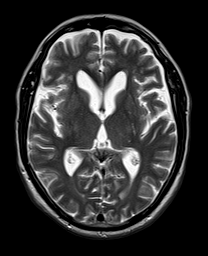
[im 27/27]
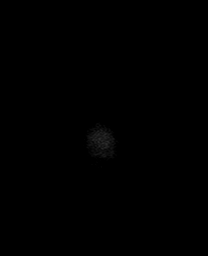

[Series 8: FLAIR · axial · 5.0mm · 0.45mm/px · z∈[-69,+77]mm · 3 of 27 slices shown]
[im 1/27]
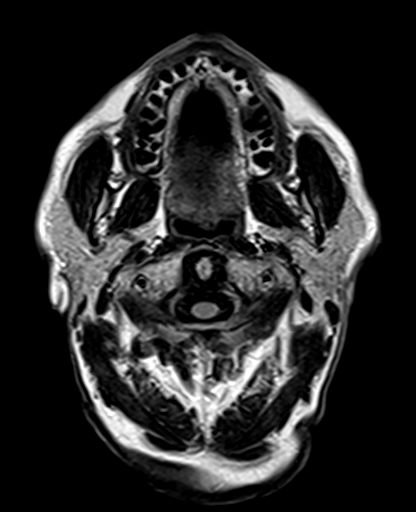
[im 14/27]
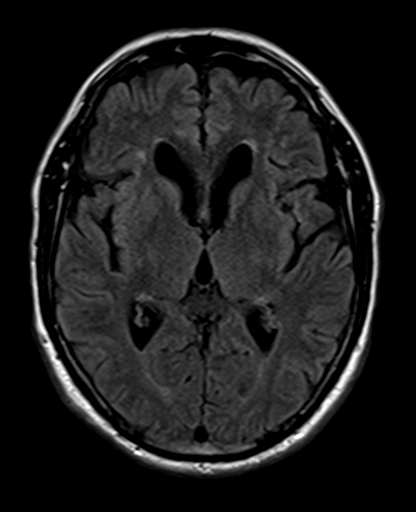
[im 27/27]
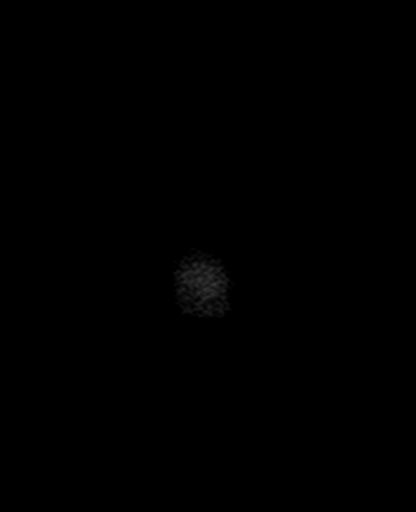

[Series 9: T2 · axial · 5.0mm · 0.45mm/px · z∈[-69,+77]mm · 3 of 27 slices shown (2 of 3)]
[im 1/27]
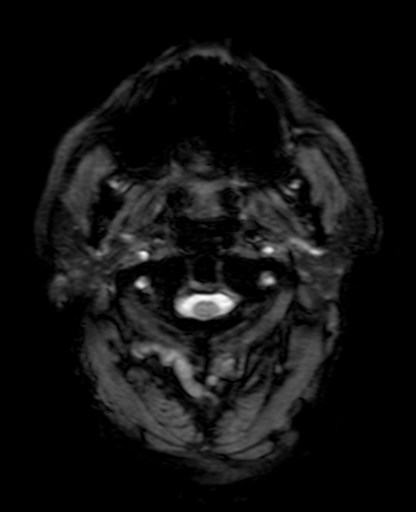
[im 14/27]
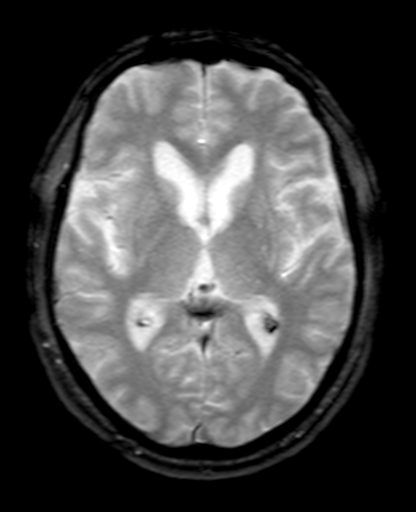
[im 27/27]
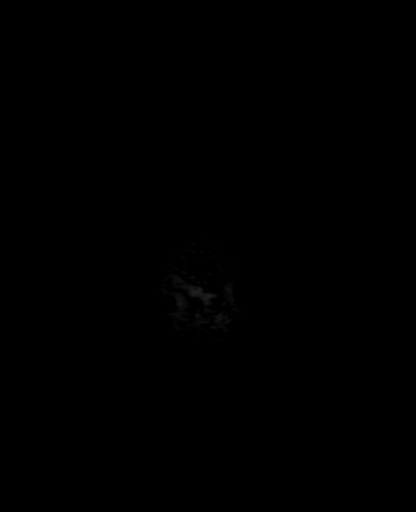

[Series 10: T1 · axial · 3.0mm · 1.00mm/px · z∈[-66,+87]mm · 7 of 60 slices shown (2 of 2)]
[im 1/60]
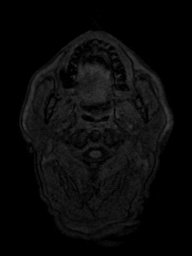
[im 10/60]
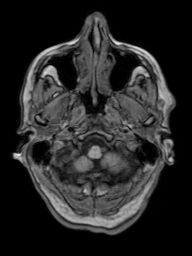
[im 20/60]
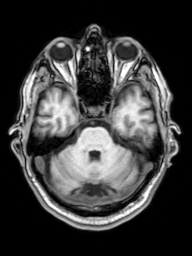
[im 30/60]
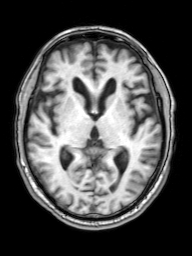
[im 40/60]
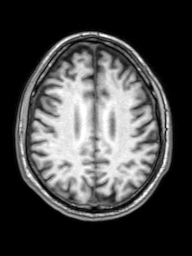
[im 50/60]
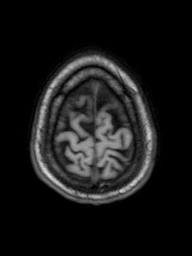
[im 60/60]
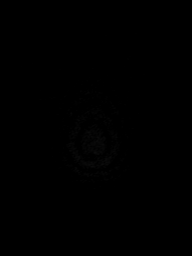

[Series 11: T2 · coronal · 5.0mm · 0.86mm/px · 4 of 31 slices shown (3 of 3)]
[im 1/31]
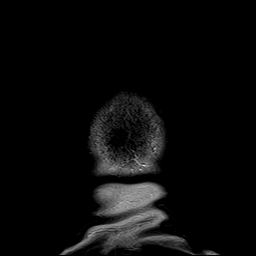
[im 11/31]
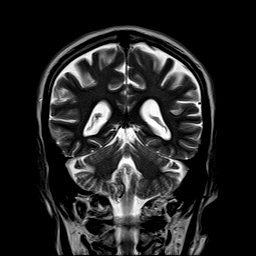
[im 21/31]
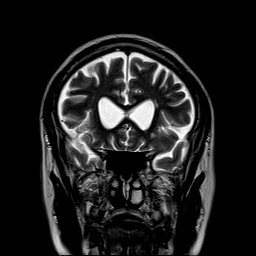
[im 31/31]
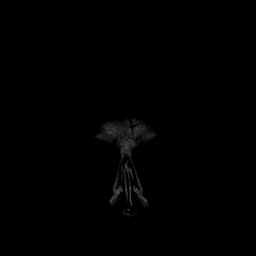

[Series 100: ax (id) · axial · 3.0mm · 1.80mm/px · z∈[-66,+74]mm · 6 of 55 slices shown]
[im 1/55]
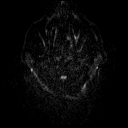
[im 11/55]
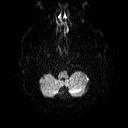
[im 22/55]
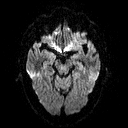
[im 33/55]
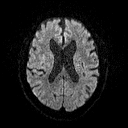
[im 44/55]
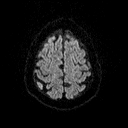
[im 55/55]
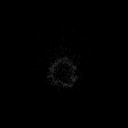

[Series 101: cor (id) · coronal · 3.0mm · 1.80mm/px · 6 of 51 slices shown]
[im 1/51]
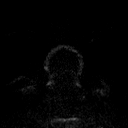
[im 11/51]
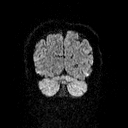
[im 21/51]
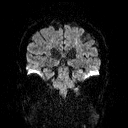
[im 31/51]
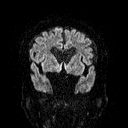
[im 41/51]
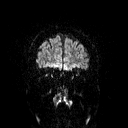
[im 51/51]
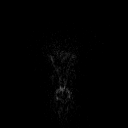

[48 of 48 positions shown; findings below may reference images not displayed]

FINDINGS: No acute infarct or intracranial hemorrhage.

Remote tiny posterior left frontal lobe infarct.

Remote tiny left paracentral pontine infarct.

Mild chronic microvascular changes.

Mild global atrophy without hydrocephalus.

No intracranial mass lesion noted on this unenhanced exam.

Major intracranial vascular structures are patent.

Cervical medullary junction unremarkable. Small mega cisterna magna
incidentally noted.

No acute orbital abnormality.
IMPRESSION: No acute infarct or intracranial hemorrhage.

Remote tiny posterior left frontal lobe infarct.

Remote tiny left paracentral pontine infarct.

Mild chronic microvascular changes.

Mild global atrophy without hydrocephalus.

## 2019-02-18 ENCOUNTER — Inpatient Hospital Stay: Admission: RE | Admit: 2019-02-18 | Payer: Medicare Other | Source: Ambulatory Visit

## 2019-02-20 ENCOUNTER — Other Ambulatory Visit: Admission: RE | Admit: 2019-02-20 | Payer: Medicare Other | Source: Ambulatory Visit

## 2019-02-25 ENCOUNTER — Ambulatory Visit: Admission: RE | Admit: 2019-02-25 | Payer: Medicare Other | Source: Home / Self Care | Admitting: Surgery

## 2019-02-25 ENCOUNTER — Encounter: Admission: RE | Payer: Self-pay | Source: Home / Self Care

## 2019-02-25 SURGERY — RELEASE, A1 PULLEY, FOR TRIGGER FINGER
Anesthesia: Choice | Site: Index Finger | Laterality: Left

## 2019-08-16 ENCOUNTER — Ambulatory Visit: Payer: Medicare Other | Attending: Internal Medicine

## 2019-08-16 DIAGNOSIS — Z23 Encounter for immunization: Secondary | ICD-10-CM | POA: Insufficient documentation

## 2019-08-16 NOTE — Progress Notes (Signed)
   Covid-19 Vaccination Clinic  Name:  Gary Craig    MRN: [id number] DOB: 1933/09/01  08/16/2019  Gary Craig was observed post Covid-19 immunization for 15 minutes without incidence. He was provided with Vaccine Information Sheet and instruction to access the V-Safe system.   Gary Craig was instructed to call 911 with any severe reactions post vaccine: Marland Kitchen Difficulty breathing  . Swelling of your face and throat  . A fast heartbeat  . A bad rash all over your body  . Dizziness and weakness    Immunizations Administered    Name Date Dose VIS Date Route   Pfizer COVID-19 Vaccine 08/16/2019  3:57 PM 0.3 mL 05/29/2019 Intramuscular   Manufacturer: [place]   Lot: [id number]   [name]: [id number]

## 2019-09-07 ENCOUNTER — Ambulatory Visit: Payer: Medicare Other

## 2019-09-08 ENCOUNTER — Ambulatory Visit: Payer: Medicare Other | Attending: Internal Medicine

## 2019-09-08 DIAGNOSIS — Z23 Encounter for immunization: Secondary | ICD-10-CM

## 2019-09-08 NOTE — Progress Notes (Signed)
   Covid-19 Vaccination Clinic  Name:  Gary Craig    MRN: [id number] DOB: [birth year]  09/08/2019  [name] was observed post Covid-19 immunization for 15 minutes without incident. He was provided with Vaccine Information Sheet and instruction to access the V-Safe system.   [name] was instructed to call 911 with any severe reactions post vaccine: Marland Kitchen Difficulty breathing  . Swelling of face and throat  . A fast heartbeat  . A bad rash all over body  . Dizziness and weakness   Immunizations Administered    Name Date Dose VIS Date Route   Pfizer COVID-19 Vaccine 09/08/2019 10:35 AM 0.3 mL 05/29/2019 Intramuscular   Manufacturer: [employer]   Lot: [id number]   [place]: [id number]

## 2020-05-05 ENCOUNTER — Other Ambulatory Visit: Payer: Self-pay

## 2020-05-05 ENCOUNTER — Encounter: Payer: Self-pay | Admitting: Emergency Medicine

## 2020-05-05 DIAGNOSIS — Z7984 Long term (current) use of oral hypoglycemic drugs: Secondary | ICD-10-CM | POA: Insufficient documentation

## 2020-05-05 DIAGNOSIS — E039 Hypothyroidism, unspecified: Secondary | ICD-10-CM | POA: Insufficient documentation

## 2020-05-05 DIAGNOSIS — R42 Dizziness and giddiness: Secondary | ICD-10-CM | POA: Insufficient documentation

## 2020-05-05 DIAGNOSIS — Z8673 Personal history of transient ischemic attack (TIA), and cerebral infarction without residual deficits: Secondary | ICD-10-CM | POA: Insufficient documentation

## 2020-05-05 DIAGNOSIS — I10 Essential (primary) hypertension: Secondary | ICD-10-CM | POA: Diagnosis not present

## 2020-05-05 DIAGNOSIS — I2510 Atherosclerotic heart disease of native coronary artery without angina pectoris: Secondary | ICD-10-CM | POA: Diagnosis not present

## 2020-05-05 DIAGNOSIS — Z951 Presence of aortocoronary bypass graft: Secondary | ICD-10-CM | POA: Insufficient documentation

## 2020-05-05 DIAGNOSIS — Z87891 Personal history of nicotine dependence: Secondary | ICD-10-CM | POA: Insufficient documentation

## 2020-05-05 DIAGNOSIS — E119 Type 2 diabetes mellitus without complications: Secondary | ICD-10-CM | POA: Diagnosis not present

## 2020-05-05 DIAGNOSIS — Z85828 Personal history of other malignant neoplasm of skin: Secondary | ICD-10-CM | POA: Diagnosis not present

## 2020-05-05 LAB — CBC
HCT: 37.6 % — ABNORMAL LOW (ref 39.0–52.0)
Hemoglobin: 12.4 g/dL — ABNORMAL LOW (ref 13.0–17.0)
MCH: 31.2 pg (ref 26.0–34.0)
MCHC: 33 g/dL (ref 30.0–36.0)
MCV: 94.5 fL (ref 80.0–100.0)
Platelets: 182 10*3/uL (ref 150–400)
RBC: 3.98 MIL/uL — ABNORMAL LOW (ref 4.22–5.81)
RDW: 12.4 % (ref 11.5–15.5)
WBC: 7.4 10*3/uL (ref 4.0–10.5)
nRBC: 0 % (ref 0.0–0.2)

## 2020-05-05 LAB — CBG MONITORING, ED: Glucose-Capillary: 147 mg/dL — ABNORMAL HIGH (ref 70–99)

## 2020-05-05 NOTE — ED Triage Notes (Addendum)
Pt arrived via [facility] from home with reports of dizziness, pt reports the dizziness is worse with standing, states he does feel some dizziness sitting in wheelchair at this time, but not as bad as it is when he stands up.  Pt states the sxs began around 2205 tonight.  Pt alert and oriented, speech clear.  Pt reports he has hx of dizziness.

## 2020-05-06 ENCOUNTER — Emergency Department
Admission: EM | Admit: 2020-05-06 | Discharge: 2020-05-06 | Disposition: A | Discharge status: Home / Self Care | Payer: Medicare Other | Attending: Emergency Medicine | Admitting: Emergency Medicine

## 2020-05-06 ENCOUNTER — Emergency Department: Payer: Medicare Other

## 2020-05-06 DIAGNOSIS — R42 Dizziness and giddiness: Secondary | ICD-10-CM

## 2020-05-06 HISTORY — DX: Dizziness and giddiness: R42

## 2020-05-06 LAB — BASIC METABOLIC PANEL
Anion gap: 10 (ref 5–15)
BUN: 23 mg/dL (ref 8–23)
CO2: 25 mmol/L (ref 22–32)
Calcium: 9.2 mg/dL (ref 8.9–10.3)
Chloride: 102 mmol/L (ref 98–111)
Creatinine, Ser: 0.91 mg/dL (ref 0.61–1.24)
GFR, Estimated: >60 mL/min (ref >60)
Glucose, Bld: 160 mg/dL — ABNORMAL HIGH (ref 70–99)
Potassium: 3.9 mmol/L (ref 3.5–5.1)
Sodium: 137 mmol/L (ref 135–145)

## 2020-05-06 MED ORDER — MECLIZINE HCL 12.5 MG PO TABS: 12.5000 mg | ORAL_TABLET | Freq: Three times a day (TID) | ORAL | 0 refills | Status: DC | PRN

## 2020-05-06 NOTE — ED Provider Notes (Signed)
[facility] Emergency Department Provider Note  ____________________________________________   First MD Initiated Contact with Patient 05/06/20 [phone]     (approximate)  I have reviewed the triage vital signs and the nursing notes.   HISTORY  Chief Complaint Dizziness    HPI Gary Craig is a 86 y.o. male with medical history as listed below which includes a prior CVA as well as a history of injury to his cervical spine which resulted in some neuropathy.  He currently takes Plavix.  He presents tonight with his wife for evaluation of acute onset and severe dizziness.  He said that he was getting ready for bed and suddenly felt extremely vertiginous, as if the room was spinning.  He was unable to maintain his balance or walk safely.  With some assistance he was able to walk down some stairs but he had to hold on as well to have an assistant hold onto him.  No vomiting, no nausea.  He denies visual changes, chest pain, shortness of breath, abdominal pain, dysuria, fever, and sore throat.  He had no weakness in his extremities and his neuropathy felt like it was at baseline, but he has never had a sensation of room spinning in the past.   The onset of the symptoms was acute, severe, and nothing in particular made it better or worse but now he feels completely back to baseline.        Past Medical History:  Diagnosis Date  . CAD (coronary artery disease)   . Carotid stenosis   . Diabetes mellitus without complication ([place])   . GERD (gastroesophageal reflux disease)    zantac as needed  . High cholesterol   . History of kidney stones   . Hypothyroidism   . Neuropathy   . S/P CABG (coronary artery bypass graft)   . Skin cancer   . Stroke [facility]) 2012   tia    Patient Active Problem List   Diagnosis Date Noted  . Paresthesia 09/04/2016  . Essential hypertension 02/10/2016  . Spinal stenosis, cervical region 02/08/2016  . Surgery, other elective   .  Spondylosis, cervical, with myelopathy   . Diabetes mellitus type 2 in nonobese (HCC)   . Coronary artery disease involving native coronary artery of native heart without angina pectoris   . History of CVA (cerebrovascular accident)   . Post-operative pain   . Dysphagia   . Leukocytosis   . Tachypnea   . Quadriparesis ([place]) 02/05/2016  . Myelopathy, spondylogenic, cervical 01/16/2016  . CVA (cerebral infarction) 05/31/2015    Past Surgical History:  Procedure Laterality Date  . ANTERIOR CERVICAL DECOMP/DISCECTOMY FUSION N/A 01/16/2016   Procedure: Cervical Four-Five Anterior cervical decompression/diskectomy/fusion;  Surgeon: Kary Kos, MD;  Location: [place] [facility];  Service: Neurosurgery;  Laterality: N/A;  right side approach  . CERVICAL WOUND DEBRIDEMENT N/A 02/05/2016   Procedure: Exploration of Cervical four - five wound. Removal of cervical four - five instrumentation. Insertion of new cervical four - five instrumentation.;  Surgeon: Leeroy Cha, MD;  Location: [facility];  Service: Neurosurgery;  Laterality: N/A;  Exploration of Cervical four - five wound. Removal of cervical four - five instrumentation. Insertion of new cervical four - five instrumentatio  . CORONARY ARTERY BYPASS GRAFT  11/29/2004  . EAR BIOPSY     skin ca  . LITHOTRIPSY Right   . POSTERIOR CERVICAL FUSION/FORAMINOTOMY N/A 02/08/2016   Procedure: CERVICAL FOUR-FIVE  Decompressive Laminectomy and Lateral Mass Fixation;  Surgeon:  Kary Kos, MD;  Location: [place] [facility];  Service: Neurosurgery;  Laterality: N/A;  . TONSILLECTOMY      Prior to Admission medications   Medication Sig Start Date End Date Taking? Authorizing Provider  acetaminophen (TYLENOL) 500 MG tablet Take 1,000 mg by mouth every 6 (six) hours as needed for moderate pain or headache.    [provider]  atorvastatin (LIPITOR) 40 MG tablet Take 1 tablet (40 mg total) by mouth at bedtime. 02/24/16   Angiulli, Lavon Paganini, PA-C  clopidogrel  (PLAVIX) 75 MG tablet Take 75 mg by mouth daily.  05/14/16   [provider]  famotidine (PEPCID) 20 MG tablet Take 20 mg by mouth daily as needed for heartburn or indigestion.    [provider]  gabapentin (NEURONTIN) 300 MG capsule Take 300 mg by mouth 2 (two) times daily.  05/17/16   [provider]  glimepiride (AMARYL) 4 MG tablet Take 1 tablet (4 mg total) by mouth daily with breakfast. Patient taking differently: Take 4 mg by mouth 2 (two) times daily.  02/24/16   Angiulli, Lavon Paganini, PA-C  Glucosamine HCl (GLUCOSAMINE PO) Take 1 tablet by mouth daily.    [provider]  levothyroxine (SYNTHROID) 100 MCG tablet Take 100 mcg by mouth daily before breakfast.    [provider]  levothyroxine (SYNTHROID, LEVOTHROID) 112 MCG tablet Take 1 tablet (112 mcg total) by mouth daily before breakfast. Patient not taking: Reported on 02/16/2019 02/24/16   Angiulli, Lavon Paganini, PA-C  lisinopril (PRINIVIL,ZESTRIL) 5 MG tablet Take 1 tablet (5 mg total) by mouth daily. Patient not taking: Reported on 02/16/2019 02/24/16   Angiulli, Lavon Paganini, PA-C  lisinopril (ZESTRIL) 2.5 MG tablet Take 2.5 mg by mouth daily.    [provider]  meclizine (ANTIVERT) 12.5 MG tablet Take 1 tablet (12.5 mg total) by mouth 3 (three) times daily as needed for dizziness. 05/06/20   Hinda Kehr, MD  Multiple Vitamin (MULTIVITAMIN WITH MINERALS) TABS tablet Take 1 tablet by mouth every morning.     [provider]  Omega-3 Fatty Acids (FISH OIL PO) Take 1 capsule by mouth daily.    [provider]  pregabalin (LYRICA) 50 MG capsule Take 1 capsule (50 mg total) by mouth 3 (three) times daily. Patient not taking: Reported on 02/16/2019 09/04/16   Marcial Pacas, MD    Allergies Metformin  Family History  Problem Relation Age of Onset  . Heart disease Mother   . Heart disease Father   . CAD Brother   . Heart block Brother     Social History Social History    Tobacco Use  . Smoking status: Former Smoker    Packs/day: 1.00    Types: Cigarettes, Pipe, Cigars    Quit date: 01/09/1983    Years since quitting: 37.3  . Smokeless tobacco: Never Used  Substance Use Topics  . Alcohol use: No    Alcohol/week: 0.0 standard drinks  . Drug use: No    Review of Systems Constitutional: No fever/chills Eyes: No visual changes. ENT: No sore throat. Cardiovascular: Denies chest pain. Respiratory: Denies shortness of breath. Gastrointestinal: No abdominal pain.  No nausea, no vomiting.  No diarrhea.  No constipation. Genitourinary: Negative for dysuria. Musculoskeletal: Negative for neck pain.  Negative for back pain. Integumentary: Negative for rash. Neurological: Severe dizziness. Negative for headaches, focal weakness or numbness.  ____________________________________________   PHYSICAL EXAM:  VITAL SIGNS: ED Triage Vitals  Enc Vitals Group  BP 05/05/20 2330 (!) 154/57     Pulse Rate 05/05/20 2330 60     Resp 05/05/20 2330 20     Temp 05/05/20 2330 97.9 F (36.6 C)     Temp Source 05/05/20 2330 Oral     SpO2 05/05/20 2327 100 %     Weight 05/05/20 2333 77.6 kg (171 lb)     Height 05/05/20 2333 1.753 m (5\' 9" )     Head Circumference --      Peak Flow --      Pain Score 05/05/20 2331 0     Pain Loc --      Pain Edu? --      Excl. in [place]? --     Constitutional: Alert and oriented.  Eyes: Conjunctivae are normal.  Head: Atraumatic. Nose: No congestion/rhinnorhea. Mouth/Throat: Patient is wearing a mask. Neck: No stridor.  No meningeal signs.   Cardiovascular: Normal rate, regular rhythm. Good peripheral circulation. Grossly normal heart sounds. Respiratory: Normal respiratory effort.  No retractions. Gastrointestinal: Soft and nontender. No distention.  Musculoskeletal: No lower extremity tenderness nor edema. No gross deformities of extremities. Neurologic:  Normal speech and language. No gross focal neurologic deficits are  appreciated.  The patient has a negative Romberg and no pronator drift.  He is able to ambulate at his baseline without any balance abnormalities.  Good grip strength and also grip strength bilaterally in upper and lower extremities.  No facial droop.  No cerebellar deficits evident based on ambulation and finger-to-nose testing without dysmetria. Skin:  Skin is warm, dry and intact. Psychiatric: Mood and affect are normal. Speech and behavior are normal.  ____________________________________________   LABS (all labs ordered are listed, but only abnormal results are displayed)  Labs Reviewed  BASIC METABOLIC PANEL - Abnormal; Notable for the following components:      Result Value   Glucose, Bld 160 (*)    All other components within normal limits  CBC - Abnormal; Notable for the following components:   RBC 3.98 (*)    Hemoglobin 12.4 (*)    HCT 37.6 (*)    All other components within normal limits  CBG MONITORING, ED - Abnormal; Notable for the following components:   Glucose-Capillary 147 (*)    All other components within normal limits  URINALYSIS, COMPLETE (UACMP) WITH MICROSCOPIC   ____________________________________________  EKG  ED ECG REPORT I, Hinda Kehr, the attending physician, personally viewed and interpreted this ECG.  Date: 05/05/2020 EKG Time: 23: 34  Rate: 56 Rhythm: Sinus bradycardia QRS Axis: normal Intervals: normal ST/T Wave abnormalities: Non-specific ST segment / T-wave changes, but no clear evidence of acute ischemia. Narrative Interpretation: no definitive evidence of acute ischemia; does not meet STEMI criteria.   ____________________________________________  RADIOLOGY I, Hinda Kehr, personally viewed and evaluated these images (plain radiographs) as part of my medical decision making, as well as reviewing the written report by the radiologist.  ED MD interpretation: No acute abnormalities identified on MRI of the brain, chronic "aging  brain"   Official radiology report(s): CLINICAL DATA: Peripheral vertigo with dizziness.  EXAM: MRI HEAD WITHOUT CONTRAST  TECHNIQUE: Multiplanar, multiecho pulse sequences of the brain and surrounding structures were obtained without intravenous contrast.  COMPARISON: 12/16/2015  FINDINGS: Brain: No acute infarction, hemorrhage, hydrocephalus, extra-axial collection or mass lesion. Cerebral volume loss in keeping with aging. Mild FLAIR hyperintensity in the cerebral white matter considered age congruent. No chronic blood products  Vascular: Normal flow voids.  Skull and upper cervical  spine: Degenerative facet spurring. No marrow infiltration.  Sinuses/Orbits: Negative. No mastoid or middle ear opacification.  IMPRESSION: Aging brain without acute finding, including infarct.   Electronically Signed By: Monte Fantasia M.D. On: 05/06/2020 05:42  ____________________________________________   PROCEDURES   Procedure(s) performed (including Critical Care):  Procedures   ____________________________________________   INITIAL IMPRESSION / MDM / [place] / ED COURSE  As part of my medical decision making, I reviewed the following data within the [place] History obtained from family, Nursing notes reviewed and incorporated, Labs reviewed , EKG interpreted , Old chart reviewed and Notes from prior ED visits   Differential diagnosis includes, but is not limited to, peripheral vertigo, central vertigo (CVA/TIA), electrolyte abnormality, acute infection, medication side effect.  His vital signs are stable other than some mild hypertension.  Basic metabolic panel, CBC, POC glucose are all within normal limits.  His physical exam including neuro exam are reassuring and at his baseline (normal for him) including a very reassuring neuro exam.  Given his age and comorbidities and the past medical history, we talked about an MRI brain to which  he agreed.  I think it is reasonable to rule out a small infarction and he and his wife are comfortable with the plan for discharge home if the MRI is normal and he still is asymptomatic.  Nothing to suggest inner ear disturbance at this time nor acute infection.  Will reassess after MRI.     Clinical Course as of May 06 712  Ludwig Clarks May 06, 2020  0634 The MRI report is not crossing over but I found it in the imaging system:  CLINICAL DATA: Peripheral vertigo with dizziness.  EXAM: MRI HEAD WITHOUT CONTRAST  TECHNIQUE: Multiplanar, multiecho pulse sequences of the brain and surrounding structures were obtained without intravenous contrast.  COMPARISON: 12/16/2015  FINDINGS: Brain: No acute infarction, hemorrhage, hydrocephalus, extra-axial collection or mass lesion. Cerebral volume loss in keeping with aging. Mild FLAIR hyperintensity in the cerebral white matter considered age congruent. No chronic blood products  Vascular: Normal flow voids.  Skull and upper cervical spine: Degenerative facet spurring. No marrow infiltration.  Sinuses/Orbits: Negative. No mastoid or middle ear opacification.  IMPRESSION: Aging brain without acute finding, including infarct.   Electronically Signed By: Monte Fantasia M.D. On: 05/06/2020 05:42   [CF]  2542 Given no acute abnormalities on MRI, I reassessed the patient.  He continues to not be dizzy.  He felt "just a little bit" vertiginous when he got up and walked to the bathroom but he feels fine and wants to go home.  I think this is reasonable and appropriate.   [CF]  [id number] I am giving a prescription for a low-dose of Antivert and I encourage close outpatient follow-up at the next available opportunity with his PCP.  He understands and agrees with the plan and I gave my usual and customary return precautions.   [CF]    Clinical Course User Index [CF] Hinda Kehr, MD    ____________________________________________  FINAL CLINICAL  IMPRESSION(S) / ED DIAGNOSES  Final diagnoses:  Vertigo     MEDICATIONS GIVEN DURING THIS VISIT:  Medications - No data to display   ED Discharge Orders         Ordered    meclizine (ANTIVERT) 12.5 MG tablet  3 times daily PRN        05/06/20 0638          *Please note:  Gary Craig was evaluated  in Emergency Department on 05/06/2020 for the symptoms described in the history of present illness. He was evaluated in the context of the global COVID-19 pandemic, which necessitated consideration that the patient might be at risk for infection with the SARS-CoV-2 virus that causes COVID-19. Institutional protocols and algorithms that pertain to the evaluation of patients at risk for COVID-19 are in a state of rapid change based on information released by regulatory bodies including the CDC and federal and state organizations. These policies and algorithms were followed during the patient's care in the ED.  Some ED evaluations and interventions may be delayed as a result of limited staffing during and after the pandemic.*  Note:  This document was prepared using Dragon voice recognition software and may include unintentional dictation errors.   Hinda Kehr, MD 05/06/20 [phone]

## 2020-05-06 NOTE — Discharge Instructions (Signed)
We believe your symptoms were caused by benign vertigo.  Please read through the included information and take any prescribed medication(s).  Follow up with your doctor as listed above.  If you develop any new or worsening symptoms that concern you, including but not limited to persistent dizziness/vertigo, numbness or weakness in your arms or legs, altered mental status, persistent vomiting, or fever greater than 101, please return immediately to the Emergency Department.  

## 2020-08-03 ENCOUNTER — Encounter: Payer: Self-pay | Admitting: Ophthalmology

## 2020-08-03 ENCOUNTER — Other Ambulatory Visit: Payer: Self-pay

## 2020-08-08 ENCOUNTER — Other Ambulatory Visit
Admission: RE | Admit: 2020-08-08 | Discharge: 2020-08-08 | Disposition: A | Discharge status: Home / Self Care | Payer: Medicare Other | Source: Ambulatory Visit | Attending: Ophthalmology | Admitting: Ophthalmology

## 2020-08-08 ENCOUNTER — Other Ambulatory Visit: Payer: Self-pay

## 2020-08-08 DIAGNOSIS — Z01812 Encounter for preprocedural laboratory examination: Secondary | ICD-10-CM | POA: Diagnosis present

## 2020-08-08 DIAGNOSIS — Z20822 Contact with and (suspected) exposure to covid-19: Secondary | ICD-10-CM | POA: Diagnosis not present

## 2020-08-08 LAB — SARS CORONAVIRUS 2 (TAT 6-24 HRS): SARS Coronavirus 2: NEGATIVE

## 2020-08-08 NOTE — Discharge Instructions (Signed)

## 2020-08-10 ENCOUNTER — Ambulatory Visit: Payer: Medicare Other | Admitting: Anesthesiology

## 2020-08-10 ENCOUNTER — Encounter: Payer: Self-pay | Admitting: Ophthalmology

## 2020-08-10 ENCOUNTER — Encounter
Admission: RE | Disposition: A | Discharge status: Home / Self Care | Payer: Self-pay | Source: Home / Self Care | Attending: Ophthalmology

## 2020-08-10 ENCOUNTER — Ambulatory Visit
Admission: RE | Admit: 2020-08-10 | Discharge: 2020-08-10 | Disposition: A | Discharge status: Home / Self Care | Payer: Medicare Other | Attending: Ophthalmology | Admitting: Ophthalmology

## 2020-08-10 ENCOUNTER — Other Ambulatory Visit: Payer: Self-pay

## 2020-08-10 DIAGNOSIS — Z7984 Long term (current) use of oral hypoglycemic drugs: Secondary | ICD-10-CM | POA: Insufficient documentation

## 2020-08-10 DIAGNOSIS — Z79890 Hormone replacement therapy (postmenopausal): Secondary | ICD-10-CM | POA: Diagnosis not present

## 2020-08-10 DIAGNOSIS — Z7902 Long term (current) use of antithrombotics/antiplatelets: Secondary | ICD-10-CM | POA: Diagnosis not present

## 2020-08-10 DIAGNOSIS — H5703 Miosis: Secondary | ICD-10-CM | POA: Diagnosis not present

## 2020-08-10 DIAGNOSIS — H2512 Age-related nuclear cataract, left eye: Secondary | ICD-10-CM | POA: Diagnosis not present

## 2020-08-10 DIAGNOSIS — Z79899 Other long term (current) drug therapy: Secondary | ICD-10-CM | POA: Insufficient documentation

## 2020-08-10 DIAGNOSIS — E1136 Type 2 diabetes mellitus with diabetic cataract: Secondary | ICD-10-CM | POA: Diagnosis not present

## 2020-08-10 DIAGNOSIS — Z87891 Personal history of nicotine dependence: Secondary | ICD-10-CM | POA: Diagnosis not present

## 2020-08-10 HISTORY — DX: Presence of external hearing-aid: Z97.4

## 2020-08-10 HISTORY — DX: Motion sickness, initial encounter: T75.3XXA

## 2020-08-10 HISTORY — PX: CATARACT EXTRACTION W/PHACO: SHX586

## 2020-08-10 LAB — GLUCOSE, CAPILLARY
Glucose-Capillary: 182 mg/dL — ABNORMAL HIGH (ref 70–99)
Glucose-Capillary: 176 mg/dL — ABNORMAL HIGH (ref 70–99)

## 2020-08-10 SURGERY — PHACOEMULSIFICATION, CATARACT, WITH IOL INSERTION
Anesthesia: Monitor Anesthesia Care | Site: Eye | Laterality: Left

## 2020-08-10 MED ORDER — BRIMONIDINE TARTRATE-TIMOLOL 0.2-0.5 % OP SOLN
OPHTHALMIC | Status: DC | PRN
  Administered 2020-08-10: 1 [drp] via OPHTHALMIC

## 2020-08-10 MED ORDER — MIDAZOLAM HCL 2 MG/2ML IJ SOLN
INTRAMUSCULAR | Status: DC | PRN
  Administered 2020-08-10: .5 mg via INTRAVENOUS

## 2020-08-10 MED ORDER — NA HYALUR & NA CHOND-NA HYALUR 0.4-0.35 ML IO KIT
PACK | INTRAOCULAR | Status: DC | PRN
  Administered 2020-08-10: 1 mL via INTRAOCULAR

## 2020-08-10 MED ORDER — CEFUROXIME OPHTHALMIC INJECTION 1 MG/0.1 ML
INJECTION | OPHTHALMIC | Status: DC | PRN
  Administered 2020-08-10: 0.1 mL via INTRACAMERAL

## 2020-08-10 MED ORDER — LACTATED RINGERS IV SOLN: INTRAVENOUS | Status: DC

## 2020-08-10 MED ORDER — FENTANYL CITRATE (PF) 100 MCG/2ML IJ SOLN
INTRAMUSCULAR | Status: DC | PRN
  Administered 2020-08-10: 50 ug via INTRAVENOUS

## 2020-08-10 MED ORDER — EPINEPHRINE PF 1 MG/ML IJ SOLN
INTRAOCULAR | Status: DC | PRN
  Administered 2020-08-10: 84 mL via OPHTHALMIC

## 2020-08-10 MED ORDER — ARMC OPHTHALMIC DILATING DROPS
1.0000 "application " | OPHTHALMIC | Status: DC | PRN
  Administered 2020-08-10 (×3): 1 via OPHTHALMIC

## 2020-08-10 MED ORDER — TETRACAINE HCL 0.5 % OP SOLN
1.0000 [drp] | OPHTHALMIC | Status: DC | PRN
  Administered 2020-08-10 (×2): 1 [drp] via OPHTHALMIC

## 2020-08-10 MED ORDER — LIDOCAINE HCL (PF) 2 % IJ SOLN
INTRAOCULAR | Status: DC | PRN
  Administered 2020-08-10: 2 mL

## 2020-08-10 SURGICAL SUPPLY — 25 items
CANNULA ANT/CHMB 27G (MISCELLANEOUS) ×1 IMPLANT
CANNULA ANT/CHMB 27GA (MISCELLANEOUS) ×2 IMPLANT
GLOVE SURG LX 7.5 STRW (GLOVE) ×1
GLOVE SURG LX STRL 7.5 STRW (GLOVE) ×1 IMPLANT
GLOVE SURG TRIUMPH 8.0 PF LTX (GLOVE) ×2 IMPLANT
GOWN STRL REUS W/ TWL LRG LVL3 (GOWN DISPOSABLE) ×2 IMPLANT
GOWN STRL REUS W/TWL LRG LVL3 (GOWN DISPOSABLE) ×4
LENS IOL IQ PAN TRC 50 23.5 IMPLANT
LENS IOL PANOP TORIC 50 23.5 ×1 IMPLANT
LENS IOL PANOPTIX TORIC 23.5 ×2 IMPLANT
MARKER SKIN DUAL TIP RULER LAB (MISCELLANEOUS) ×2 IMPLANT
NDL CAPSULORHEX 25GA (NEEDLE) ×1 IMPLANT
NDL FILTER BLUNT 18X1 1/2 (NEEDLE) ×2 IMPLANT
NEEDLE CAPSULORHEX 25GA (NEEDLE) ×2 IMPLANT
NEEDLE FILTER BLUNT 18X 1/2SAF (NEEDLE) ×2
NEEDLE FILTER BLUNT 18X1 1/2 (NEEDLE) ×2 IMPLANT
PACK CATARACT BRASINGTON (MISCELLANEOUS) ×2 IMPLANT
PACK EYE AFTER SURG (MISCELLANEOUS) ×2 IMPLANT
PACK OPTHALMIC (MISCELLANEOUS) ×2 IMPLANT
RING MALYGIN 7.0 (MISCELLANEOUS) ×1 IMPLANT
SOLUTION OPHTHALMIC SALT (MISCELLANEOUS) ×2 IMPLANT
SYR 3ML LL SCALE MARK (SYRINGE) ×4 IMPLANT
SYR TB 1ML LUER SLIP (SYRINGE) ×2 IMPLANT
WATER STERILE IRR 250ML POUR (IV SOLUTION) ×2 IMPLANT
WIPE NON LINTING 3.25X3.25 (MISCELLANEOUS) ×2 IMPLANT

## 2020-08-10 NOTE — H&P (Signed)

## 2020-08-10 NOTE — Anesthesia Procedure Notes (Signed)
Date/Time: 08/10/2020 8:47 AM Performed by: Dionne Bucy, CRNA Pre-anesthesia Checklist: Patient identified, Emergency Drugs available, Suction available, Patient being monitored and Timeout performed Oxygen Delivery Method: Nasal cannula Placement Confirmation: positive ETCO2

## 2020-08-10 NOTE — Anesthesia Postprocedure Evaluation (Signed)
Anesthesia Post Note  Patient: Gary Craig  Procedure(s) Performed: CATARACT EXTRACTION PHACO AND INTRAOCULAR LENS PLACEMENT (IOC) LEFT PANOPTIX TORIC MALYUGIN 6.72 01:15.9 8.8% (Left Eye)     Patient location during evaluation: PACU Anesthesia Type: MAC Level of consciousness: awake and alert Pain management: pain level controlled Vital Signs Assessment: post-procedure vital signs reviewed and stable Cardiovascular status: blood pressure returned to baseline Postop Assessment: no apparent nausea or vomiting Anesthetic complications: no   No complications documented.  Melodie Ashworth [employer]

## 2020-08-10 NOTE — Op Note (Addendum)
LOCATION:  [place]   PREOPERATIVE DIAGNOSIS:  Nuclear sclerotic cataract of the left eye.  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   PROCEDURE:  Phacoemulsification with Toric posterior chamber intraocular lens placement of the left eye with Malyugin ring.  Ultrasound time: Procedure(s) with comments: CATARACT EXTRACTION PHACO AND INTRAOCULAR LENS PLACEMENT (IOC) LEFT PANOPTIX TORIC MALYUGIN 6.72 01:15.9 8.8% (Left) - Diabetic - oral meds LENS:[id number] Panoptix Toric intraocular lens with 3.0 diopters of cylindrical power with axis orientation at 5 degrees.    SURGEON:  Wyonia Hough, MD   ANESTHESIA:  Topical with tetracaine drops and 2% Xylocaine jelly, augmented with 1% preservative-free intracameral lidocaine.  COMPLICATIONS:  None.   DESCRIPTION OF PROCEDURE:  The patient was identified in the holding room and transported to the operating suite and placed in the supine position under the operating microscope.  The left eye was identified as the operative eye, and it was prepped and draped in the usual sterile ophthalmic fashion.    A clear-corneal paracentesis incision was made at the 1:30 position.  0.5 ml of preservative-free 1% lidocaine was injected into the anterior chamber. The anterior chamber was filled with Viscoat.  A 2.4 millimeter near clear corneal incision was then made at the 10:30 position. A Malyugin ring was inserted to enlarge the pupil to 7 mm.  A cystotome and capsulorrhexis forceps were then used to make a curvilinear capsulorrhexis.  Hydrodissection and hydrodelineation were then performed using balanced salt solution.   Phacoemulsification was then used in stop and chop fashion to remove the lens, nucleus and epinucleus.  The remaining cortex was aspirated using the irrigation and aspiration handpiece.  Provisc viscoelastic was then placed into the capsular bag to distend it for lens  placement.  The Verion digital marker was used to align the implant at the intended axis.   A 23.5 diopter lens was then injected into the capsular bag. The Malyugin ring was explanted.  It was rotated clockwise until the axis marks on the lens were approximately 15 degrees in the counterclockwise direction to the intended alignment.  The viscoelastic was aspirated from the eye using the irrigation aspiration handpiece.  Then, a Koch spatula through the sideport incision was used to rotate the lens in a clockwise direction until the axis markings of the intraocular lens were lined up with the Verion alignment.  Balanced salt solution was then used to hydrate the wounds. Cefuroxime 0.1 ml of a 10mg /ml solution was injected into the anterior chamber for a dose of 1 mg of intracameral antibiotic at the completion of the case.    The eye was noted to have a physiologic pressure and there was no wound leak noted.   Timolol and Brimonidine drops were applied to the eye.  The patient was taken to the recovery room in stable condition having had no complications of anesthesia or surgery.  Almeta Geisel 08/10/2020, 9:12 AM

## 2020-08-10 NOTE — Transfer of Care (Signed)
Immediate Anesthesia Transfer of Care Note  Patient: Gary Craig  Procedure(s) Performed: CATARACT EXTRACTION PHACO AND INTRAOCULAR LENS PLACEMENT ([facility]) LEFT PANOPTIX TORIC MALYUGIN 6.72 01:15.9 8.8% (Left Eye)  Patient Location: PACU  Anesthesia Type: MAC  Level of Consciousness: awake, alert  and patient cooperative  Airway and Oxygen Therapy: Patient Spontanous Breathing and Patient connected to supplemental oxygen  Post-op Assessment: Post-op Vital signs reviewed, Patient's Cardiovascular Status Stable, Respiratory Function Stable, Patent Airway and No signs of Nausea or vomiting  Post-op Vital Signs: Reviewed and stable  Complications: No complications documented.

## 2020-08-10 NOTE — Anesthesia Preprocedure Evaluation (Signed)
Anesthesia Evaluation  Patient identified by MRN, date of birth, ID band Patient awake    Reviewed: Allergy & Precautions, NPO status , Patient's Chart, lab work & pertinent test results  Airway Mallampati: II  TM Distance: >3 FB Neck ROM: Full    Dental no notable dental hx.    Pulmonary former smoker,    Pulmonary exam normal        Cardiovascular hypertension, Pt. on medications + CAD and + CABG  Normal cardiovascular exam     Neuro/Psych CVA    GI/Hepatic GERD  Controlled,  Endo/Other  diabetes, Type 2Hypothyroidism   Renal/GU      Musculoskeletal  (+) Arthritis ,   Abdominal Normal abdominal exam  (+)   Peds  Hematology   Anesthesia Other Findings   Reproductive/Obstetrics                             Anesthesia Physical Anesthesia Plan  ASA: III  Anesthesia Plan: MAC   Post-op Pain Management:    Induction: Intravenous  PONV Risk Score and Plan: 1 and TIVA, Midazolam and Treatment may vary due to age or medical condition  Airway Management Planned: Natural Airway and Nasal Cannula  Additional Equipment: None  Intra-op Plan:   Post-operative Plan:   Informed Consent: I have reviewed the patients History and Physical, chart, labs and discussed the procedure including the risks, benefits and alternatives for the proposed anesthesia with the patient or authorized representative who has indicated his/her understanding and acceptance.     Dental advisory given  Plan Discussed with: CRNA  Anesthesia Plan Comments:         Anesthesia Quick Evaluation

## 2020-08-11 ENCOUNTER — Encounter: Payer: Self-pay | Admitting: Ophthalmology

## 2020-08-15 ENCOUNTER — Encounter: Payer: Self-pay | Admitting: Ophthalmology

## 2020-08-22 ENCOUNTER — Other Ambulatory Visit
Admission: RE | Admit: 2020-08-22 | Discharge: 2020-08-22 | Disposition: A | Discharge status: Home / Self Care | Payer: Medicare Other | Source: Ambulatory Visit | Attending: Ophthalmology | Admitting: Ophthalmology

## 2020-08-22 ENCOUNTER — Other Ambulatory Visit: Payer: Self-pay

## 2020-08-22 DIAGNOSIS — Z20822 Contact with and (suspected) exposure to covid-19: Secondary | ICD-10-CM | POA: Insufficient documentation

## 2020-08-22 DIAGNOSIS — Z01812 Encounter for preprocedural laboratory examination: Secondary | ICD-10-CM | POA: Insufficient documentation

## 2020-08-22 NOTE — Discharge Instructions (Signed)

## 2020-08-23 LAB — SARS CORONAVIRUS 2 (TAT 6-24 HRS): SARS Coronavirus 2: NEGATIVE

## 2020-08-24 ENCOUNTER — Encounter
Admission: RE | Disposition: A | Discharge status: Home / Self Care | Payer: Self-pay | Source: Home / Self Care | Attending: Ophthalmology

## 2020-08-24 ENCOUNTER — Encounter: Payer: Self-pay | Admitting: Ophthalmology

## 2020-08-24 ENCOUNTER — Ambulatory Visit: Payer: Medicare Other | Admitting: Anesthesiology

## 2020-08-24 ENCOUNTER — Other Ambulatory Visit: Payer: Self-pay

## 2020-08-24 ENCOUNTER — Ambulatory Visit
Admission: RE | Admit: 2020-08-24 | Discharge: 2020-08-24 | Disposition: A | Discharge status: Home / Self Care | Payer: Medicare Other | Attending: Ophthalmology | Admitting: Ophthalmology

## 2020-08-24 DIAGNOSIS — Z8673 Personal history of transient ischemic attack (TIA), and cerebral infarction without residual deficits: Secondary | ICD-10-CM | POA: Diagnosis not present

## 2020-08-24 DIAGNOSIS — Z79890 Hormone replacement therapy (postmenopausal): Secondary | ICD-10-CM | POA: Insufficient documentation

## 2020-08-24 DIAGNOSIS — H5703 Miosis: Secondary | ICD-10-CM | POA: Insufficient documentation

## 2020-08-24 DIAGNOSIS — Z7984 Long term (current) use of oral hypoglycemic drugs: Secondary | ICD-10-CM | POA: Insufficient documentation

## 2020-08-24 DIAGNOSIS — Z79899 Other long term (current) drug therapy: Secondary | ICD-10-CM | POA: Diagnosis not present

## 2020-08-24 DIAGNOSIS — Z87891 Personal history of nicotine dependence: Secondary | ICD-10-CM | POA: Diagnosis not present

## 2020-08-24 DIAGNOSIS — Z951 Presence of aortocoronary bypass graft: Secondary | ICD-10-CM | POA: Insufficient documentation

## 2020-08-24 DIAGNOSIS — E1136 Type 2 diabetes mellitus with diabetic cataract: Secondary | ICD-10-CM | POA: Diagnosis present

## 2020-08-24 DIAGNOSIS — Z85850 Personal history of malignant neoplasm of thyroid: Secondary | ICD-10-CM | POA: Insufficient documentation

## 2020-08-24 DIAGNOSIS — H2511 Age-related nuclear cataract, right eye: Secondary | ICD-10-CM | POA: Diagnosis not present

## 2020-08-24 HISTORY — PX: CATARACT EXTRACTION W/PHACO: SHX586

## 2020-08-24 LAB — GLUCOSE, CAPILLARY
Glucose-Capillary: 171 mg/dL — ABNORMAL HIGH (ref 70–99)
Glucose-Capillary: 165 mg/dL — ABNORMAL HIGH (ref 70–99)

## 2020-08-24 SURGERY — PHACOEMULSIFICATION, CATARACT, WITH IOL INSERTION
Anesthesia: Monitor Anesthesia Care | Site: Eye | Laterality: Right

## 2020-08-24 MED ORDER — BRIMONIDINE TARTRATE-TIMOLOL 0.2-0.5 % OP SOLN
OPHTHALMIC | Status: DC | PRN
  Administered 2020-08-24: 1 [drp] via OPHTHALMIC

## 2020-08-24 MED ORDER — ACETAMINOPHEN 160 MG/5ML PO SOLN: 325.0000 mg | ORAL | Status: DC | PRN

## 2020-08-24 MED ORDER — LACTATED RINGERS IV SOLN: INTRAVENOUS | Status: DC

## 2020-08-24 MED ORDER — MIDAZOLAM HCL 2 MG/2ML IJ SOLN
INTRAMUSCULAR | Status: DC | PRN
  Administered 2020-08-24: 1.5 mg via INTRAVENOUS

## 2020-08-24 MED ORDER — ACETAMINOPHEN 325 MG PO TABS: 325.0000 mg | ORAL_TABLET | ORAL | Status: DC | PRN

## 2020-08-24 MED ORDER — LIDOCAINE HCL (PF) 2 % IJ SOLN
INTRAOCULAR | Status: DC | PRN
  Administered 2020-08-24: 1 mL

## 2020-08-24 MED ORDER — PHENYLEPHRINE HCL 10 % OP SOLN
1.0000 [drp] | OPHTHALMIC | Status: DC | PRN
  Administered 2020-08-24 (×3): 1 [drp] via OPHTHALMIC

## 2020-08-24 MED ORDER — TETRACAINE HCL 0.5 % OP SOLN
1.0000 [drp] | OPHTHALMIC | Status: DC | PRN
  Administered 2020-08-24 (×3): 1 [drp] via OPHTHALMIC

## 2020-08-24 MED ORDER — CEFUROXIME OPHTHALMIC INJECTION 1 MG/0.1 ML
INJECTION | OPHTHALMIC | Status: DC | PRN
  Administered 2020-08-24: 0.1 mL via INTRACAMERAL

## 2020-08-24 MED ORDER — NA HYALUR & NA CHOND-NA HYALUR 0.4-0.35 ML IO KIT
PACK | INTRAOCULAR | Status: DC | PRN
  Administered 2020-08-24: 1 mL via INTRAOCULAR

## 2020-08-24 MED ORDER — CYCLOPENTOLATE HCL 2 % OP SOLN
1.0000 [drp] | OPHTHALMIC | Status: DC | PRN
  Administered 2020-08-24 (×3): 1 [drp] via OPHTHALMIC

## 2020-08-24 MED ORDER — EPINEPHRINE PF 1 MG/ML IJ SOLN
INTRAOCULAR | Status: DC | PRN
  Administered 2020-08-24: 97 mL via OPHTHALMIC

## 2020-08-24 MED ORDER — FENTANYL CITRATE (PF) 100 MCG/2ML IJ SOLN
INTRAMUSCULAR | Status: DC | PRN
  Administered 2020-08-24: 50 ug via INTRAVENOUS

## 2020-08-24 SURGICAL SUPPLY — 30 items
CANNULA ANT/CHMB 27G (MISCELLANEOUS) ×1 IMPLANT
CANNULA ANT/CHMB 27GA (MISCELLANEOUS) ×2 IMPLANT
GLOVE SURG LX 7.5 STRW (GLOVE) ×2
GLOVE SURG LX STRL 7.5 STRW (GLOVE) ×1 IMPLANT
GLOVE SURG TRIUMPH 8.0 PF LTX (GLOVE) ×2 IMPLANT
GOWN STRL REUS W/ TWL LRG LVL3 (GOWN DISPOSABLE) ×2 IMPLANT
GOWN STRL REUS W/TWL LRG LVL3 (GOWN DISPOSABLE) ×4
LENS IOL IQ PAN TRC 50 23.0 IMPLANT
LENS IOL PANOP TORIC 50 23.0 ×1 IMPLANT
LENS IOL PANOPTIX TORIC 23.0 ×2 IMPLANT
MARKER SKIN DUAL TIP RULER LAB (MISCELLANEOUS) ×2 IMPLANT
NDL CAPSULORHEX 25GA (NEEDLE) ×1 IMPLANT
NDL FILTER BLUNT 18X1 1/2 (NEEDLE) ×2 IMPLANT
NDL RETROBULBAR .5 NSTRL (NEEDLE) IMPLANT
NEEDLE CAPSULORHEX 25GA (NEEDLE) ×2 IMPLANT
NEEDLE FILTER BLUNT 18X 1/2SAF (NEEDLE) ×2
NEEDLE FILTER BLUNT 18X1 1/2 (NEEDLE) ×2 IMPLANT
PACK CATARACT BRASINGTON (MISCELLANEOUS) ×2 IMPLANT
PACK EYE AFTER SURG (MISCELLANEOUS) ×2 IMPLANT
PACK OPTHALMIC (MISCELLANEOUS) ×2 IMPLANT
RING MALYGIN 7.0 (MISCELLANEOUS) ×1 IMPLANT
SOLUTION OPHTHALMIC SALT (MISCELLANEOUS) ×2 IMPLANT
SUT ETHILON 10-0 CS-B-6CS-B-6 (SUTURE)
SUT VICRYL  9 0 (SUTURE)
SUT VICRYL 9 0 (SUTURE) IMPLANT
SUTURE EHLN 10-0 CS-B-6CS-B-6 (SUTURE) IMPLANT
SYR 3ML LL SCALE MARK (SYRINGE) ×4 IMPLANT
SYR TB 1ML LUER SLIP (SYRINGE) ×2 IMPLANT
WATER STERILE IRR 250ML POUR (IV SOLUTION) ×2 IMPLANT
WIPE NON LINTING 3.25X3.25 (MISCELLANEOUS) ×2 IMPLANT

## 2020-08-24 NOTE — Anesthesia Postprocedure Evaluation (Signed)
Anesthesia Post Note  Patient: Gary Craig  Procedure(s) Performed: CATARACT EXTRACTION PHACO AND INTRAOCULAR LENS PLACEMENT ([place]) RIGHT PANOPTIX TORIC MALYUGIN (Right Eye)     Patient location during evaluation: PACU Anesthesia Type: MAC Level of consciousness: awake and alert Pain management: pain level controlled Vital Signs Assessment: post-procedure vital signs reviewed and stable Respiratory status: spontaneous breathing, nonlabored ventilation, respiratory function stable and patient connected to nasal cannula oxygen Cardiovascular status: stable and blood pressure returned to baseline Postop Assessment: no apparent nausea or vomiting Anesthetic complications: no   No complications documented.  Trecia Rogers

## 2020-08-24 NOTE — H&P (Signed)

## 2020-08-24 NOTE — Anesthesia Procedure Notes (Signed)
Procedure Name: MAC Performed by: Karalina Tift, CRNA Pre-anesthesia Checklist: Patient identified, Emergency Drugs available, Suction available, Timeout performed and Patient being monitored Patient Re-evaluated:Patient Re-evaluated prior to induction Oxygen Delivery Method: Nasal cannula Placement Confirmation: positive ETCO2       

## 2020-08-24 NOTE — Anesthesia Preprocedure Evaluation (Signed)
Anesthesia Evaluation  Patient identified by MRN, date of birth, ID band Patient awake    Reviewed: Allergy & Precautions, H&P , NPO status , Patient's Chart, lab work & pertinent test results, reviewed documented beta blocker date and time   Airway Mallampati: II  TM Distance: >3 FB Neck ROM: full    Dental no notable dental hx.    Pulmonary former smoker,    Pulmonary exam normal breath sounds clear to auscultation       Cardiovascular Exercise Tolerance: Good hypertension, + CAD and + CABG  Normal cardiovascular exam Rhythm:regular Rate:Normal     Neuro/Psych CVA negative psych ROS   GI/Hepatic Neg liver ROS, GERD  ,  Endo/Other  diabetes, Type 2, Oral Hypoglycemic AgentsHypothyroidism   Renal/GU negative Renal ROS  negative genitourinary   Musculoskeletal   Abdominal   Peds  Hematology negative hematology ROS (+)   Anesthesia Other Findings   Reproductive/Obstetrics negative OB ROS                             Anesthesia Physical Anesthesia Plan  ASA: III  Anesthesia Plan: MAC   Post-op Pain Management:    Induction:   PONV Risk Score and Plan:   Airway Management Planned:   Additional Equipment:   Intra-op Plan:   Post-operative Plan:   Informed Consent: I have reviewed the patients History and Physical, chart, labs and discussed the procedure including the risks, benefits and alternatives for the proposed anesthesia with the patient or authorized representative who has indicated his/her understanding and acceptance.     Dental Advisory Given  Plan Discussed with: CRNA and Anesthesiologist  Anesthesia Plan Comments:         Anesthesia Quick Evaluation

## 2020-08-24 NOTE — Transfer of Care (Signed)
Immediate Anesthesia Transfer of Care Note  Patient: Gary Craig  Procedure(s) Performed: CATARACT EXTRACTION PHACO AND INTRAOCULAR LENS PLACEMENT ([place]) RIGHT PANOPTIX TORIC MALYUGIN (Right Eye)  Patient Location: PACU  Anesthesia Type: MAC  Level of Consciousness: awake, alert  and patient cooperative  Airway and Oxygen Therapy: Patient Spontanous Breathing and Patient connected to supplemental oxygen  Post-op Assessment: Post-op Vital signs reviewed, Patient's Cardiovascular Status Stable, Respiratory Function Stable, Patent Airway and No signs of Nausea or vomiting  Post-op Vital Signs: Reviewed and stable  Complications: No complications documented.

## 2020-08-24 NOTE — Op Note (Signed)
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   PROCEDURE:  Phacoemulsification with Toric posterior chamber intraocular lens placement of the right eye with insertion of Malyugion ring.  Ultrasound time: Procedure(s) with comments: CATARACT EXTRACTION PHACO AND INTRAOCULAR LENS PLACEMENT ([facility]) RIGHT PANOPTIX TORIC MALYUGIN (Right) - 7.58 1:13.7 8.3%  LENS:   Implant Name Type Inv. Item Serial No. Manufacturer Lot No. LRB No. Used Action  LENS IOL PANOPTIX TORIC 23.0 - [id number]  LENS IOL PANOPTIX TORIC 23.0 [id number] ALCON  Right 1 Implanted     [id number] Panoptix Toric intraocular lens with 3.0 diopters of cylindrical power with axis orientation at 166 degrees.    SURGEON:  Wyonia Hough, MD   ANESTHESIA: Topical with tetracaine drops and 2% Xylocaine jelly, augmented with 1% preservative-free intracameral lidocaine. .   COMPLICATIONS:  None.   DESCRIPTION OF PROCEDURE:  The patient was identified in the holding room and transported to the operating suite and placed in the supine position under the operating microscope.  The right eye was identified as the operative eye, and it was prepped and draped in the usual sterile ophthalmic fashion.    A clear-corneal paracentesis incision was made at the 12:00 position.  0.5 ml of preservative-free 1% lidocaine was injected into the anterior chamber. The anterior chamber was filled with Viscoat.  A 2.4 millimeter near clear corneal incision was then made at the 9:00 position. A Malyugin ring was inserted to enlarge the pupil to 71mm for cataract extraction. A cystotome and capsulorrhexis forceps were then used to make a curvilinear capsulorrhexis.  Hydrodissection and hydrodelineation were then performed using balanced salt solution.   Phacoemulsification was  then used in stop and chop fashion to remove the lens, nucleus and epinucleus.  The remaining cortex was aspirated using the irrigation and aspiration handpiece.  Provisc viscoelastic was then placed into the capsular bag to distend it for lens placement.  The Verion digital marker was used to align the implant at the intended axis.   A Toric lens was then injected into the capsular bag.  It was rotated clockwise until the axis marks on the lens were approximately 15 degrees in the counterclockwise direction to the intended alignment. The Malyugin ring was removed. The viscoelastic was aspirated from the eye using the irrigation aspiration handpiece.  Then, a Koch spatula through the sideport incision was used to rotate the lens in a clockwise direction until the axis markings of the intraocular lens were lined up with the Verion alignment.  Balanced salt solution was then used to hydrate the wounds. Cefuroxime 0.1 ml of a 10mg /ml solution was injected into the anterior chamber for a dose of 1 mg of intracameral antibiotic at the completion of the case.    The eye was noted to have a physiologic pressure and there was no wound leak noted.   Timolol and Brimonidine drops were applied to the eye.  The patient was taken to the recovery room in stable condition having had no complications of anesthesia or surgery.  BRASINGTON,CHADWICK 08/24/2020, 11:15 AM

## 2020-08-26 ENCOUNTER — Encounter: Payer: Self-pay | Admitting: Ophthalmology

## 2021-08-15 ENCOUNTER — Other Ambulatory Visit: Payer: Self-pay | Admitting: Family Medicine

## 2021-08-15 DIAGNOSIS — M25552 Pain in left hip: Secondary | ICD-10-CM

## 2021-08-15 DIAGNOSIS — G5702 Lesion of sciatic nerve, left lower limb: Secondary | ICD-10-CM

## 2021-08-24 ENCOUNTER — Ambulatory Visit: Payer: Medicare Other

## 2022-06-24 ENCOUNTER — Emergency Department: Payer: Medicare Other

## 2022-06-24 ENCOUNTER — Other Ambulatory Visit: Payer: Self-pay

## 2022-06-24 ENCOUNTER — Encounter: Payer: Self-pay | Admitting: Radiology

## 2022-06-24 ENCOUNTER — Emergency Department
Admission: EM | Admit: 2022-06-24 | Discharge: 2022-06-24 | Disposition: A | Discharge status: Home / Self Care | Payer: Medicare Other | Attending: Emergency Medicine | Admitting: Emergency Medicine

## 2022-06-24 DIAGNOSIS — E119 Type 2 diabetes mellitus without complications: Secondary | ICD-10-CM | POA: Diagnosis not present

## 2022-06-24 DIAGNOSIS — R42 Dizziness and giddiness: Secondary | ICD-10-CM | POA: Insufficient documentation

## 2022-06-24 DIAGNOSIS — I10 Essential (primary) hypertension: Secondary | ICD-10-CM | POA: Insufficient documentation

## 2022-06-24 LAB — BASIC METABOLIC PANEL
Anion gap: 11 (ref 5–15)
BUN: 21 mg/dL (ref 8–23)
CO2: 26 mmol/L (ref 22–32)
Calcium: 9.5 mg/dL (ref 8.9–10.3)
Chloride: 100 mmol/L (ref 98–111)
Creatinine, Ser: 1.05 mg/dL (ref 0.61–1.24)
GFR, Estimated: >60 mL/min (ref >60)
Glucose, Bld: 174 mg/dL — ABNORMAL HIGH (ref 70–99)
Potassium: 4.2 mmol/L (ref 3.5–5.1)
Sodium: 137 mmol/L (ref 135–145)

## 2022-06-24 LAB — CBC
HCT: 39.2 % (ref 39.0–52.0)
Hemoglobin: 12.9 g/dL — ABNORMAL LOW (ref 13.0–17.0)
MCH: 31.1 pg (ref 26.0–34.0)
MCHC: 32.9 g/dL (ref 30.0–36.0)
MCV: 94.5 fL (ref 80.0–100.0)
Platelets: 210 10*3/uL (ref 150–400)
RBC: 4.15 MIL/uL — ABNORMAL LOW (ref 4.22–5.81)
RDW: 12.5 % (ref 11.5–15.5)
WBC: 7.7 10*3/uL (ref 4.0–10.5)
nRBC: 0 % (ref 0.0–0.2)

## 2022-06-24 LAB — TROPONIN I (HIGH SENSITIVITY)
Troponin I (High Sensitivity): 6 ng/L (ref <18)
Troponin I (High Sensitivity): 6 ng/L (ref <18)

## 2022-06-24 NOTE — ED Triage Notes (Signed)
First nurse note: pt presents to ER via [facility] with c/o dizziness that started around [place] while driving to church.  Dizziness did not change regardless of pts position.  Pt does have hx of CABG.  On arrival, pt was diaphoretic.  Pt is A&O x4 on arrival.  Stroke screen negative per ems.

## 2022-06-24 NOTE — ED Triage Notes (Signed)
Pt to ED from church via EMS. Pt advised he was headed to church at 620pm when he had a sudden onset of dizziness. He was able to get to church and when he got there he had his pastor call 911. By the time EMS got there he said he felt fine and felt like it was maybe some vertigo which he has had before but wasn't sure and wanted to come to ED to make sure. Pt denies any other symptoms. Stroke neg at this time. Pt is CAOx4 and in no acute distress.

## 2022-06-24 NOTE — ED Provider Notes (Signed)
[facility] Provider Note    Event Date/Time   First MD Initiated Contact with Patient 06/24/22 2124     (approximate)   History   Chief Complaint: Dizziness   HPI  Gary Craig is a 89 y.o. male with a history of diabetes, hypertension, carotid atherosclerosis, GERD, vertigo and motion sickness who comes ED complaining of episode of dizziness that started while he was heading to church, driving in his car about 6:20 PM.  It was sudden onset, he felt off balance.  He was able to make it all the way to the church, struggled to walk to the building due to unsteadiness.  Had EMS called.  Symptoms lasted about an hour and then resolved.  He feels asymptomatic and back to baseline now.  No falls, no trauma, no neck pain or fever.  No recent illness.     Physical Exam   Triage Vital Signs: ED Triage Vitals [06/24/22 1937]  Enc Vitals Group     BP (!) 146/60     Pulse Rate 67     Resp 16     Temp 98 F (36.7 C)     Temp Source Oral     SpO2 100 %     Weight 165 lb (74.8 kg)     Height '5\' 9"'$  (1.753 m)     Head Circumference      Peak Flow      Pain Score 0     Pain Loc      Pain Edu?      Excl. in Portal?     Most recent vital signs: Vitals:   06/24/22 1937 06/24/22 2136  BP: (!) 146/60 (!) 179/70  Pulse: 67   Resp: 16 20  Temp: 98 F (36.7 C)   SpO2: 100%     General: Awake, no distress.  CV:  Good peripheral perfusion.  Regular rate and rhythm Resp:  Normal effort.  Clear to auscultation bilaterally Abd:  No distention.  Other:  Steady gait.  No orthostasis.  Blood pressure and heart rate stable with standing, no recurrence of symptoms with change in position in the ED.  Moving all extremities, stroke scale 0   ED Results / Procedures / Treatments   Labs (all labs ordered are listed, but only abnormal results are displayed) Labs Reviewed  BASIC METABOLIC PANEL - Abnormal; Notable for the following components:      Result Value    Glucose, Bld 174 (*)    All other components within normal limits  CBC - Abnormal; Notable for the following components:   RBC 4.15 (*)    Hemoglobin 12.9 (*)    All other components within normal limits  URINALYSIS, ROUTINE W REFLEX MICROSCOPIC  TROPONIN I (HIGH SENSITIVITY)  TROPONIN I (HIGH SENSITIVITY)     EKG Interpreted by me Sinus rhythm rate of 59.  Normal axis, slight first-degree AV block.  Normal QRS ST segments and T waves.   RADIOLOGY CT head interpreted by me, negative for mass or intracranial hemorrhage.  Radiology report reviewed.  MRI brain unremarkable   PROCEDURES:  Procedures   MEDICATIONS ORDERED IN ED: Medications - No data to display   IMPRESSION / MDM / [place] / ED COURSE  I reviewed the triage vital signs and the nursing notes.  Differential diagnosis includes, but is not limited to, benign vertigo, dehydration, electrolyte abnormality, anemia, ischemic stroke  Patient's presentation is most consistent with acute presentation with potential threat to life or bodily function.  Patient presents with near syncope/vertigo, he does have a long history of motion sickness and prior vertigo.  In the ED he is asymptomatic and neuroexam is normal.  CT head and MRI brain are normal.  Labs unremarkable.  Stable for discharge.       FINAL CLINICAL IMPRESSION(S) / ED DIAGNOSES   Final diagnoses:  Dizziness     Rx / DC Orders   ED Discharge Orders     None        Note:  This document was prepared using Dragon voice recognition software and may include unintentional dictation errors.   Carrie Mew, MD 06/24/22 2348

## 2022-06-24 NOTE — Discharge Instructions (Signed)
Your MRI of the brain and other tests in the emergency department today are all okay.  Continue taking your usual medications and follow-up with your doctor this week.

## 2022-12-06 ENCOUNTER — Other Ambulatory Visit (INDEPENDENT_AMBULATORY_CARE_PROVIDER_SITE_OTHER): Payer: Self-pay | Admitting: Nurse Practitioner

## 2022-12-06 DIAGNOSIS — I6522 Occlusion and stenosis of left carotid artery: Secondary | ICD-10-CM

## 2022-12-11 ENCOUNTER — Ambulatory Visit (INDEPENDENT_AMBULATORY_CARE_PROVIDER_SITE_OTHER): Payer: Medicare Other | Admitting: Vascular Surgery

## 2022-12-11 ENCOUNTER — Ambulatory Visit (INDEPENDENT_AMBULATORY_CARE_PROVIDER_SITE_OTHER): Payer: Medicare Other

## 2022-12-11 VITALS — BP 123/67 | HR 65 | Resp 17 | Ht 68.0 in | Wt 166.4 lb

## 2022-12-11 DIAGNOSIS — I6522 Occlusion and stenosis of left carotid artery: Secondary | ICD-10-CM | POA: Diagnosis not present

## 2022-12-11 DIAGNOSIS — I6523 Occlusion and stenosis of bilateral carotid arteries: Secondary | ICD-10-CM

## 2022-12-11 DIAGNOSIS — E119 Type 2 diabetes mellitus without complications: Secondary | ICD-10-CM

## 2022-12-11 DIAGNOSIS — I10 Essential (primary) hypertension: Secondary | ICD-10-CM

## 2022-12-11 DIAGNOSIS — I6529 Occlusion and stenosis of unspecified carotid artery: Secondary | ICD-10-CM | POA: Insufficient documentation

## 2022-12-11 NOTE — Progress Notes (Signed)
Patient ID: Gary Craig, male   DOB: [birth year], 89 y.o.   MRN: [id number]  Chief Complaint  Patient presents with   Establish Care    HPI Gary Craig is a 89 y.o. male.  I am asked to see the patient by Dr. Juliann Pares for evaluation of carotid stenosis.  I have actually seen the patient before for carotid disease, but not in the last 6-7 years.  His previous cardiologist had retired and his new cardiologist PA astutely noted that he had not had his carotids checked in a few years.  He does have a history of carotid disease.  He has not had any focal neurologic symptoms recently.  He has a remote history of a TIA.  The carotid duplex at their office suggested mild carotid artery disease but they suggested a possible greater than 70% stenosis in the left carotid artery.  By typical velocity criteria, this would appear to be a more moderate degree of blockage and I would have interpreted this in the 60 to 79% range and likely on the lower end of the range.  This was a couple of months ago.  We repeated a carotid duplex today which demonstrated 1 to 39% right ICA stenosis and velocities in the 40 to 59% range for the left ICA stenosis.  He takes Plavix and Lipitor.   Past Medical History:  Diagnosis Date   CAD (coronary artery disease)    Carotid stenosis    Diabetes mellitus without complication (HCC)    GERD (gastroesophageal reflux disease)    zantac as needed   High cholesterol    History of kidney stones    Hypothyroidism    Motion sickness    Neuropathy    S/P CABG (coronary artery bypass graft)    Skin cancer    Stroke (HCC) 2012   tia   Vertigo 05/06/2020   Wears hearing aid in both ears     Past Surgical History:  Procedure Laterality Date   ANTERIOR CERVICAL DECOMP/DISCECTOMY FUSION N/A 01/16/2016   Procedure: Cervical Four-Five Anterior cervical decompression/diskectomy/fusion;  Surgeon: Donalee Citrin, MD;  Location: [facility];  Service: Neurosurgery;  Laterality:  N/A;  right side approach   CATARACT EXTRACTION W/PHACO Left 08/10/2020   Procedure: CATARACT EXTRACTION PHACO AND INTRAOCULAR LENS PLACEMENT ([facility]) LEFT PANOPTIX TORIC MALYUGIN 6.72 01:15.9 8.8%;  Surgeon: Lockie Mola, MD;  Location: [facility];  Service: Ophthalmology;  Laterality: Left;  Diabetic - oral meds   CATARACT EXTRACTION W/PHACO Right 08/24/2020   Procedure: CATARACT EXTRACTION PHACO AND INTRAOCULAR LENS PLACEMENT ([facility]) RIGHT PANOPTIX TORIC MALYUGIN;  Surgeon: Lockie Mola, MD;  Location: [facility];  Service: Ophthalmology;  Laterality: Right;  7.58 1:13.7 8.3%   CERVICAL WOUND DEBRIDEMENT N/A 02/05/2016   Procedure: Exploration of Cervical four - five wound. Removal of cervical four - five instrumentation. Insertion of new cervical four - five instrumentation.;  Surgeon: Hilda Lias, MD;  Location: [facility];  Service: Neurosurgery;  Laterality: N/A;  Exploration of Cervical four - five wound. Removal of cervical four - five instrumentation. Insertion of new cervical four - five instrumentatio   CORONARY ARTERY BYPASS GRAFT  11/29/2004   EAR BIOPSY     skin ca   LITHOTRIPSY Right    POSTERIOR CERVICAL FUSION/FORAMINOTOMY N/A 02/08/2016   Procedure: CERVICAL FOUR-FIVE  Decompressive Laminectomy and Lateral Mass Fixation;  Surgeon: Donalee Citrin, MD;  Location: [facility];  Service: Neurosurgery;  Laterality: N/A;   TONSILLECTOMY  Family History  Problem Relation Age of Onset   Heart disease Mother    Heart disease Father    CAD Brother    Heart block Brother       Social History   Tobacco Use   Smoking status: Former    Packs/day: 1    Types: Cigarettes, Pipe, Cigars    Quit date: 01/09/1983    Years since quitting: 39.9   Smokeless tobacco: Never  Vaping Use   Vaping Use: Never used  Substance Use Topics   Alcohol use: No    Alcohol/week: 0.0 standard drinks of alcohol   Drug use: No     Allergies  Allergen Reactions    Icosapent Ethyl     (Vascepa)  Increased blood sugars   Metformin     Other reaction(s): Other (See Comments) GI symptoms    Current Outpatient Medications  Medication Sig Dispense Refill   acetaminophen (TYLENOL) 500 MG tablet Take 1,000 mg by mouth every 6 (six) hours as needed for moderate pain or headache.     atorvastatin (LIPITOR) 40 MG tablet Take 1 tablet (40 mg total) by mouth at bedtime. 30 tablet 0   clopidogrel (PLAVIX) 75 MG tablet Take 75 mg by mouth daily.      famotidine (PEPCID) 20 MG tablet Take 20 mg by mouth daily as needed for heartburn or indigestion.     glimepiride (AMARYL) 4 MG tablet Take 1 tablet by mouth 2 (two) times daily.     Glucosamine HCl (GLUCOSAMINE PO) Take 1 tablet by mouth daily.     levothyroxine (SYNTHROID) 100 MCG tablet Take 100 mcg by mouth daily before breakfast.     lisinopril (ZESTRIL) 2.5 MG tablet Take 2.5 mg by mouth daily.     Multiple Vitamin (MULTIVITAMIN WITH MINERALS) TABS tablet Take 1 tablet by mouth every morning.      Omega-3 Fatty Acids (FISH OIL PO) Take 1 capsule by mouth daily.     sitaGLIPtin (JANUVIA) 50 MG tablet Take 25 mg by mouth daily.     No current facility-administered medications for this visit.      REVIEW OF SYSTEMS (Negative unless checked)  Constitutional: [] Weight loss  [] Fever  [] Chills Cardiac: [] Chest pain   [] Chest pressure   [] Palpitations   [] Shortness of breath when laying flat   [] Shortness of breath at rest   [] Shortness of breath with exertion. Vascular:  [] Pain in legs with walking   [] Pain in legs at rest   [] Pain in legs when laying flat   [] Claudication   [] Pain in feet when walking  [] Pain in feet at rest  [] Pain in feet when laying flat   [] History of DVT   [] Phlebitis   [] Swelling in legs   [] Varicose veins   [] Non-healing ulcers Pulmonary:   [] Uses home oxygen   [] Productive cough   [] Hemoptysis   [] Wheeze  [] COPD   [] Asthma Neurologic:  [x] Dizziness  [] Blackouts   [] Seizures    [] History of stroke   [x] History of TIA  [] Aphasia   [] Temporary blindness   [] Dysphagia   [] Weakness or numbness in arms   [] Weakness or numbness in legs Musculoskeletal:  [x] Arthritis   [] Joint swelling   [] Joint pain   [] Low back pain Hematologic:  [] Easy bruising  [] Easy bleeding   [] Hypercoagulable state   [] Anemic  [] Hepatitis Gastrointestinal:  [] Blood in stool   [] Vomiting blood  [] Gastroesophageal reflux/heartburn   [] Abdominal pain Genitourinary:  [] Chronic kidney disease   [] Difficult urination  [] Frequent  urination  [] Burning with urination   [] Hematuria Skin:  [] Rashes   [] Ulcers   [] Wounds Psychological:  [] History of anxiety   []  History of major depression.    Physical Exam BP 123/67 (BP Location: Left Arm)   Pulse 65   Resp 17   Ht 5\' 8"  (1.727 m)   Wt 166 lb 6.4 oz (75.5 kg)   BMI 25.30 kg/m  Gen:  WD/WN, NAD. Appears younger than stated age. Head: [place]/AT, No temporalis wasting. Ear/Nose/Throat: Hearing grossly intact, nares w/o erythema or drainage, oropharynx w/o Erythema/Exudate Eyes: Conjunctiva clear, sclera non-icteric  Neck: trachea midline.  No JVD.  Pulmonary:  Good air movement, respirations not labored, no use of accessory muscles  Cardiac: RRR, no JVD Vascular:  Vessel Right Left  Radial Palpable Palpable                                   Gastrointestinal:. No masses, surgical incisions, or scars. Musculoskeletal: M/S 5/5 throughout.  Extremities without ischemic changes.  No deformity or atrophy. No edema. Neurologic: Sensation grossly intact in extremities.  Symmetrical.  Speech is fluent. Motor exam as listed above. Psychiatric: Judgment intact, Mood & affect appropriate for pt's clinical situation. Dermatologic: No rashes or ulcers noted.  No cellulitis or open wounds.    Radiology No results found.  Labs No results found for this or any previous visit (from the past 2160 hour(s)).  Assessment/Plan:  Carotid stenosis The carotid  duplex at his cardiologist suggested mild carotid artery disease but they suggested a possible greater than 70% stenosis in the left carotid artery.  By typical velocity criteria, this would appear to be a more moderate degree of blockage and I would have interpreted this in the 60 to 79% range and likely on the lower end of the range.  This was a couple of months ago.  We repeated a carotid duplex today which demonstrated 1 to 39% right ICA stenosis and velocities in the 40 to 59% range for the left ICA stenosis.  He takes Plavix and Lipitor.  We had a long discussion today regarding the pathophysiology and natural history of carotid disease.  He is 69 and not currently symptomatic.  I would not recommend any intervention for his disease at this time.  I would recommend we continue to follow this and I will plan on seeing him on 55-month intervals with duplex.  He should continue his Plavix and statin agent  Essential hypertension blood pressure control important in reducing the progression of atherosclerotic disease. On appropriate oral medications.   Diabetes mellitus type 2 in nonobese (HCC) blood glucose control important in reducing the progression of atherosclerotic disease. Also, involved in wound healing. On appropriate medications.      Festus Barren 12/11/2022, 2:57 PM   This note was created with Dragon medical transcription system.  Any errors from dictation are unintentional.

## 2022-12-11 NOTE — Assessment & Plan Note (Signed)
blood pressure control important in reducing the progression of atherosclerotic disease. On appropriate oral medications.  

## 2022-12-11 NOTE — Assessment & Plan Note (Signed)
The carotid duplex at his cardiologist suggested mild carotid artery disease but they suggested a possible greater than 70% stenosis in the left carotid artery.  By typical velocity criteria, this would appear to be a more moderate degree of blockage and I would have interpreted this in the 60 to 79% range and likely on the lower end of the range.  This was a couple of months ago.  We repeated a carotid duplex today which demonstrated 1 to 39% right ICA stenosis and velocities in the 40 to 59% range for the left ICA stenosis.  He takes Plavix and Lipitor.  We had a long discussion today regarding the pathophysiology and natural history of carotid disease.  He is 70 and not currently symptomatic.  I would not recommend any intervention for his disease at this time.  I would recommend we continue to follow this and I will plan on seeing him on 72-month intervals with duplex.  He should continue his Plavix and statin agent

## 2022-12-11 NOTE — Assessment & Plan Note (Signed)
blood glucose control important in reducing the progression of atherosclerotic disease. Also, involved in wound healing. On appropriate medications.  

## 2023-01-15 ENCOUNTER — Emergency Department: Payer: Medicare Other

## 2023-01-15 ENCOUNTER — Observation Stay
Admission: EM | Admit: 2023-01-15 | Discharge: 2023-01-16 | Disposition: A | Discharge status: Home / Self Care | Payer: Medicare Other | Attending: Hospitalist | Admitting: Hospitalist

## 2023-01-15 ENCOUNTER — Encounter: Payer: Self-pay | Admitting: Emergency Medicine

## 2023-01-15 ENCOUNTER — Other Ambulatory Visit: Payer: Self-pay

## 2023-01-15 DIAGNOSIS — E039 Hypothyroidism, unspecified: Secondary | ICD-10-CM | POA: Diagnosis not present

## 2023-01-15 DIAGNOSIS — E785 Hyperlipidemia, unspecified: Secondary | ICD-10-CM | POA: Diagnosis present

## 2023-01-15 DIAGNOSIS — A4189 Other specified sepsis: Secondary | ICD-10-CM | POA: Diagnosis not present

## 2023-01-15 DIAGNOSIS — Z7902 Long term (current) use of antithrombotics/antiplatelets: Secondary | ICD-10-CM | POA: Insufficient documentation

## 2023-01-15 DIAGNOSIS — I2510 Atherosclerotic heart disease of native coronary artery without angina pectoris: Secondary | ICD-10-CM | POA: Diagnosis not present

## 2023-01-15 DIAGNOSIS — Z79899 Other long term (current) drug therapy: Secondary | ICD-10-CM | POA: Diagnosis not present

## 2023-01-15 DIAGNOSIS — Z87891 Personal history of nicotine dependence: Secondary | ICD-10-CM | POA: Diagnosis not present

## 2023-01-15 DIAGNOSIS — U071 COVID-19: Principal | ICD-10-CM | POA: Insufficient documentation

## 2023-01-15 DIAGNOSIS — I10 Essential (primary) hypertension: Secondary | ICD-10-CM | POA: Diagnosis present

## 2023-01-15 DIAGNOSIS — E119 Type 2 diabetes mellitus without complications: Secondary | ICD-10-CM | POA: Diagnosis not present

## 2023-01-15 DIAGNOSIS — A419 Sepsis, unspecified organism: Secondary | ICD-10-CM | POA: Diagnosis not present

## 2023-01-15 DIAGNOSIS — J189 Pneumonia, unspecified organism: Secondary | ICD-10-CM | POA: Diagnosis not present

## 2023-01-15 DIAGNOSIS — Z85828 Personal history of other malignant neoplasm of skin: Secondary | ICD-10-CM | POA: Diagnosis not present

## 2023-01-15 DIAGNOSIS — Z8673 Personal history of transient ischemic attack (TIA), and cerebral infarction without residual deficits: Secondary | ICD-10-CM | POA: Insufficient documentation

## 2023-01-15 DIAGNOSIS — R6520 Severe sepsis without septic shock: Secondary | ICD-10-CM | POA: Diagnosis present

## 2023-01-15 DIAGNOSIS — E1140 Type 2 diabetes mellitus with diabetic neuropathy, unspecified: Secondary | ICD-10-CM | POA: Insufficient documentation

## 2023-01-15 DIAGNOSIS — Z951 Presence of aortocoronary bypass graft: Secondary | ICD-10-CM | POA: Diagnosis not present

## 2023-01-15 DIAGNOSIS — R059 Cough, unspecified: Secondary | ICD-10-CM | POA: Diagnosis present

## 2023-01-15 DIAGNOSIS — J1282 Pneumonia due to coronavirus disease 2019: Secondary | ICD-10-CM | POA: Diagnosis present

## 2023-01-15 LAB — CBC
HCT: 38.7 % — ABNORMAL LOW (ref 39.0–52.0)
Hemoglobin: 12.8 g/dL — ABNORMAL LOW (ref 13.0–17.0)
MCH: 30.6 pg (ref 26.0–34.0)
MCHC: 33.1 g/dL (ref 30.0–36.0)
MCV: 92.6 fL (ref 80.0–100.0)
Platelets: 175 10*3/uL (ref 150–400)
RBC: 4.18 MIL/uL — ABNORMAL LOW (ref 4.22–5.81)
RDW: 12.4 % (ref 11.5–15.5)
WBC: 9.7 10*3/uL (ref 4.0–10.5)
nRBC: 0 % (ref 0.0–0.2)

## 2023-01-15 LAB — CBG MONITORING, ED
Glucose-Capillary: 236 mg/dL — ABNORMAL HIGH (ref 70–99)
Glucose-Capillary: 155 mg/dL — ABNORMAL HIGH (ref 70–99)

## 2023-01-15 LAB — COMPREHENSIVE METABOLIC PANEL
ALT: 24 U/L (ref 0–44)
AST: 29 U/L (ref 15–41)
Albumin: 4.1 g/dL (ref 3.5–5.0)
Alkaline Phosphatase: 109 U/L (ref 38–126)
Anion gap: 10 (ref 5–15)
BUN: 14 mg/dL (ref 8–23)
CO2: 22 mmol/L (ref 22–32)
Calcium: 9 mg/dL (ref 8.9–10.3)
Chloride: 98 mmol/L (ref 98–111)
Creatinine, Ser: 1.1 mg/dL (ref 0.61–1.24)
GFR, Estimated: >60 mL/min (ref >60)
Glucose, Bld: 254 mg/dL — ABNORMAL HIGH (ref 70–99)
Potassium: 4.2 mmol/L (ref 3.5–5.1)
Sodium: 130 mmol/L — ABNORMAL LOW (ref 135–145)
Total Bilirubin: 0.5 mg/dL (ref 0.3–1.2)
Total Protein: 7 g/dL (ref 6.5–8.1)

## 2023-01-15 LAB — LACTIC ACID, PLASMA
Lactic Acid, Venous: 3 mmol/L (ref 0.5–1.9)
Lactic Acid, Venous: 2.1 mmol/L (ref 0.5–1.9)

## 2023-01-15 LAB — SARS CORONAVIRUS 2 BY RT PCR: SARS Coronavirus 2 by RT PCR: POSITIVE — AB

## 2023-01-15 LAB — PROCALCITONIN: Procalcitonin: <0.1 ng/mL

## 2023-01-15 MED ORDER — ATORVASTATIN CALCIUM 20 MG PO TABS: 40.0000 mg | ORAL_TABLET | Freq: Every day | ORAL | Status: DC

## 2023-01-15 MED ORDER — HYDRALAZINE HCL 20 MG/ML IJ SOLN: 5.0000 mg | INTRAMUSCULAR | Status: DC | PRN

## 2023-01-15 MED ORDER — ACETAMINOPHEN 325 MG PO TABS
650.0000 mg | ORAL_TABLET | Freq: Four times a day (QID) | ORAL | Status: DC | PRN
  Administered 2023-01-15: 650 mg via ORAL
  Filled 2023-01-15: qty 2

## 2023-01-15 MED ORDER — LEVOTHYROXINE SODIUM 50 MCG PO TABS
100.0000 ug | ORAL_TABLET | Freq: Every day | ORAL | Status: DC
  Administered 2023-01-16: 100 ug via ORAL
  Filled 2023-01-15: qty 2

## 2023-01-15 MED ORDER — SODIUM CHLORIDE 0.9 % IV SOLN: INTRAVENOUS | Status: DC

## 2023-01-15 MED ORDER — FAMOTIDINE 20 MG PO TABS: 20.0000 mg | ORAL_TABLET | Freq: Every day | ORAL | Status: DC | PRN

## 2023-01-15 MED ORDER — ADULT MULTIVITAMIN W/MINERALS CH: 1.0000 | ORAL_TABLET | Freq: Every day | ORAL | Status: DC

## 2023-01-15 MED ORDER — CLOPIDOGREL BISULFATE 75 MG PO TABS
75.0000 mg | ORAL_TABLET | Freq: Every day | ORAL | Status: DC
  Administered 2023-01-15: 75 mg via ORAL
  Filled 2023-01-15: qty 1

## 2023-01-15 MED ORDER — INSULIN ASPART 100 UNIT/ML IJ SOLN
0.0000 [IU] | Freq: Three times a day (TID) | INTRAMUSCULAR | Status: DC
  Administered 2023-01-15: 2 [IU] via SUBCUTANEOUS
  Filled 2023-01-15: qty 1

## 2023-01-15 MED ORDER — SODIUM CHLORIDE 0.9 % IV SOLN
500.0000 mg | Freq: Once | INTRAVENOUS | Status: AC
  Administered 2023-01-15: 500 mg via INTRAVENOUS
  Filled 2023-01-15: qty 5

## 2023-01-15 MED ORDER — ONDANSETRON HCL 4 MG/2ML IJ SOLN: 4.0000 mg | Freq: Three times a day (TID) | INTRAMUSCULAR | Status: DC | PRN

## 2023-01-15 MED ORDER — ALBUTEROL SULFATE HFA 108 (90 BASE) MCG/ACT IN AERS: 2.0000 | INHALATION_SPRAY | RESPIRATORY_TRACT | Status: DC | PRN

## 2023-01-15 MED ORDER — METHYLPREDNISOLONE SODIUM SUCC 40 MG IJ SOLR
40.0000 mg | Freq: Two times a day (BID) | INTRAMUSCULAR | Status: DC
  Administered 2023-01-15 – 2023-01-16 (×2): 40 mg via INTRAVENOUS
  Filled 2023-01-15 (×2): qty 1

## 2023-01-15 MED ORDER — SODIUM CHLORIDE 0.9 % IV SOLN
1.0000 g | Freq: Once | INTRAVENOUS | Status: AC
  Administered 2023-01-15: 1 g via INTRAVENOUS
  Filled 2023-01-15: qty 10

## 2023-01-15 MED ORDER — SODIUM CHLORIDE 0.9 % IV BOLUS
500.0000 mL | Freq: Once | INTRAVENOUS | Status: AC
  Administered 2023-01-15: 500 mL via INTRAVENOUS

## 2023-01-15 MED ORDER — INSULIN ASPART 100 UNIT/ML IJ SOLN
6.0000 [IU] | Freq: Once | INTRAMUSCULAR | Status: AC
  Administered 2023-01-15: 6 [IU] via SUBCUTANEOUS
  Filled 2023-01-15: qty 1

## 2023-01-15 MED ORDER — INSULIN ASPART 100 UNIT/ML IJ SOLN: 0.0000 [IU] | Freq: Every day | INTRAMUSCULAR | Status: DC

## 2023-01-15 MED ORDER — ENOXAPARIN SODIUM 40 MG/0.4ML IJ SOSY
40.0000 mg | PREFILLED_SYRINGE | INTRAMUSCULAR | Status: DC
  Administered 2023-01-15: 40 mg via SUBCUTANEOUS
  Filled 2023-01-15: qty 0.4

## 2023-01-15 MED ORDER — DM-GUAIFENESIN ER 30-600 MG PO TB12: 1.0000 | ORAL_TABLET | Freq: Two times a day (BID) | ORAL | Status: DC | PRN

## 2023-01-15 NOTE — ED Provider Notes (Signed)
[facility] Provider Note    Event Date/Time   First MD Initiated Contact with Patient 01/15/23 1348     (approximate)   History   Weakness   HPI  Gary Craig is a 89 y.o. male with history of CAD, diabetes who presents with complaints of diffuse weakness.  Patient reports that he has had a cough and felt fatigued.  He thinks he may have had a fever.  Denies dysuria.  No abdominal pain.  No rash.     Physical Exam   Triage Vital Signs: ED Triage Vitals  Encounter Vitals Group     BP 01/15/23 1346 (!) 143/60     Systolic BP Percentile --      Diastolic BP Percentile --      Pulse Rate 01/15/23 1346 89     Resp 01/15/23 1346 18     Temp 01/15/23 1346 (!) 100.8 F (38.2 C)     Temp Source 01/15/23 1346 Oral     SpO2 01/15/23 1346 100 %     Weight 01/15/23 1347 75 kg (165 lb 5.5 oz)     Height 01/15/23 1347 1.727 m (5\' 8" )     Head Circumference --      Peak Flow --      Pain Score 01/15/23 1347 0     Pain Loc --      Pain Education --      Exclude from Growth Chart --     Most recent vital signs: Vitals:   01/16/23 0539 01/16/23 0920  BP: (!) 102/55 (!) 106/55  Pulse: (!) 58 70  Resp: 20 18  Temp:    SpO2: 100% 96%     General: Awake, no distress.  CV:  Good peripheral perfusion.  Resp:  Normal effort.  Abd:  No distention soft, nontender Other:     ED Results / Procedures / Treatments   Labs (all labs ordered are listed, but only abnormal results are displayed) Labs Reviewed  SARS CORONAVIRUS 2 BY RT PCR - Abnormal; Notable for the following components:      Result Value   SARS Coronavirus 2 by RT PCR POSITIVE (*)    All other components within normal limits  CBC - Abnormal; Notable for the following components:   RBC 4.18 (*)    Hemoglobin 12.8 (*)    HCT 38.7 (*)    All other components within normal limits  COMPREHENSIVE METABOLIC PANEL - Abnormal; Notable for the following components:   Sodium 130 (*)     Glucose, Bld 254 (*)    All other components within normal limits  LACTIC ACID, PLASMA - Abnormal; Notable for the following components:   Lactic Acid, Venous 3.0 (*)    All other components within normal limits  LACTIC ACID, PLASMA - Abnormal; Notable for the following components:   Lactic Acid, Venous 2.1 (*)    All other components within normal limits  URINALYSIS, ROUTINE W REFLEX MICROSCOPIC - Abnormal; Notable for the following components:   Color, Urine YELLOW (*)    APPearance HAZY (*)    Glucose, UA 150 (*)    All other components within normal limits  BASIC METABOLIC PANEL - Abnormal; Notable for the following components:   Sodium 134 (*)    CO2 21 (*)    Glucose, Bld 230 (*)    All other components within normal limits  CBC - Abnormal; Notable for the following components:   RBC 3.83 (*)  Hemoglobin 11.8 (*)    HCT 35.4 (*)    All other components within normal limits  C-REACTIVE PROTEIN - Abnormal; Notable for the following components:   CRP 7.9 (*)    All other components within normal limits  HEMOGLOBIN A1C - Abnormal; Notable for the following components:   Hgb A1c MFr Bld 6.9 (*)    All other components within normal limits  CBG MONITORING, ED - Abnormal; Notable for the following components:   Glucose-Capillary 236 (*)    All other components within normal limits  CBG MONITORING, ED - Abnormal; Notable for the following components:   Glucose-Capillary 155 (*)    All other components within normal limits  CBG MONITORING, ED - Abnormal; Notable for the following components:   Glucose-Capillary 242 (*)    All other components within normal limits  CULTURE, BLOOD (ROUTINE X 2)  CULTURE, BLOOD (ROUTINE X 2)  PROCALCITONIN  LACTIC ACID, PLASMA     EKG  ED ECG REPORT I, Jene Every, the attending physician, personally viewed and interpreted this ECG.  Date: 01/15/2023  Rhythm: normal sinus rhythm QRS Axis: normal Intervals: normal ST/T Wave  abnormalities: normal Narrative Interpretation: no evidence of acute ischemia    RADIOLOGY Chest x-ray viewed interpreted by me, possible venous congestion versus atypical pneumonia    PROCEDURES:  Critical Care performed:   Procedures   MEDICATIONS ORDERED IN ED: Medications  cefTRIAXone (ROCEPHIN) 1 g in sodium chloride 0.9 % 100 mL IVPB (0 g Intravenous Stopped 01/15/23 1539)  azithromycin (ZITHROMAX) 500 mg in sodium chloride 0.9 % 250 mL IVPB (0 mg Intravenous Stopped 01/15/23 1643)  insulin aspart (novoLOG) injection 6 Units (6 Units Subcutaneous Given 01/15/23 1609)  sodium chloride 0.9 % bolus 500 mL (0 mLs Intravenous Stopped 01/15/23 1817)     IMPRESSION / MDM / ASSESSMENT AND PLAN / ED COURSE  I reviewed the triage vital signs and the nursing notes. Patient's presentation is most consistent with acute presentation with potential threat to life or bodily function.  Patient presents with fever of 100.8.  He feels weak and does have a cough.  Differential includes pneumonia, COVID, viral illness, sepsis  Will obtain x-ray, labs including lactic acid, COVID PCR, urinalysis and reevaluate  Chest x-ray questionable for pneumonia versus venous congestion, white blood cell count is normal however.  Will await COVID, urinalysis.  Additional labs  Patient's lactic is elevated.  Questionable pneumonia, pending urinalysis   Have asked my colleague to follow-up on additional tests      FINAL CLINICAL IMPRESSION(S) / ED DIAGNOSES   Final diagnoses:  Sepsis, due to unspecified organism, unspecified whether acute organ dysfunction present (HCC)  COVID-19     Rx / DC Orders   ED Discharge Orders     None        Note:  This document was prepared using Dragon voice recognition software and may include unintentional dictation errors.   Jene Every, MD 01/17/23 [phone]

## 2023-01-15 NOTE — ED Triage Notes (Signed)
Pt reports weakness since last night and elevated CBG of 255 today. Denies n/v or pain. Reports decreased appetite since last night.

## 2023-01-15 NOTE — ED Provider Notes (Signed)
Patient received in signout from Dr. Cyril Loosen pending follow-up labs.  Patient does meet septic criteria lactate of 3 febrile tachypneic.  Received antibiotics due to chest x-ray showing evidence of pneumonia.  No white count.  He is COVID-positive.  Does agree to treat with Paxlovid.  Given his age and sepsis criteria I do believe that admission the hospital clinically indicated.   Willy Eddy, MD 01/15/23 [phone]

## 2023-01-15 NOTE — ED Notes (Signed)
Patient is refusing any of the following medications: Remdesavir, Paxlovid, Lagevrio, and Mono Chlorial Antibodies

## 2023-01-15 NOTE — H&P (Signed)
History and Physical    MAJOUR LAMM EAV:[id number] DOB: 05-15-1934 DOA: 01/15/2023  Referring MD/NP/PA:   PCP: Marisue Ivan, MD   Patient coming from:  The patient is coming from home.     Chief Complaint: weakness, fever and cough  HPI: Gary Craig is a 89 y.o. male with medical history significant of HLD, DM, CAD, s/p of CABG, stroke, hypothyroidism, carotid artery stenosis, kidney stone, who presents with weakness, fever, cough.  Patient states that he started feeling sick since yesterday.  He has generalized weakness, fever, chills, dry cough.  No sore throat or runny nose.  Denies shortness of breath, chest pain.  No known nausea, vomiting, diarrhea or abdominal pain.  No symptoms of UTI.  No fall.  Data reviewed independently and ED Course: pt was found to have positive COVID PCR, WBC 9.7, GFR> 60, temperature 100.8, blood pressure 143/60, heart rate 93, RR 18, oxygen saturation 100% on RA.  Chest x-ray showed bilateral interstitial opacity, consistent with atypical pneumonia.  Patient is placed on MedSurg bed for observation.   EKG: I have personally reviewed.  Sinus rhythm, QTc 422, LAD, nonspecific T wave change   Review of Systems:   General: has fevers, chills, no body weight gain, has poor appetite, has fatigue HEENT: no blurry vision, hearing changes or sore throat Respiratory: no dyspnea, has coughing, no wheezing CV: no chest pain, no palpitations GI: no nausea, vomiting, abdominal pain, diarrhea, constipation GU: no dysuria, burning on urination, increased urinary frequency, hematuria  Ext: no leg edema Neuro: no unilateral weakness, numbness, or tingling, no vision change or hearing loss Skin: no rash, no skin tear. MSK: No muscle spasm, no deformity, no limitation of range of movement in spin Heme: No easy bruising.  Travel history: No recent long distant travel.   Allergy:  Allergies  Allergen Reactions   Icosapent Ethyl     (Vascepa)   Increased blood sugars   Metformin     Other reaction(s): Other (See Comments) GI symptoms    Past Medical History:  Diagnosis Date   CAD (coronary artery disease)    Carotid stenosis    Diabetes mellitus without complication (HCC)    GERD (gastroesophageal reflux disease)    zantac as needed   High cholesterol    History of kidney stones    Hypothyroidism    Motion sickness    Neuropathy    S/P CABG (coronary artery bypass graft)    Skin cancer    Stroke (HCC) 2012   tia   Vertigo 05/06/2020   Wears hearing aid in both ears     Past Surgical History:  Procedure Laterality Date   ANTERIOR CERVICAL DECOMP/DISCECTOMY FUSION N/A 01/16/2016   Procedure: Cervical Four-Five Anterior cervical decompression/diskectomy/fusion;  Surgeon: Donalee Citrin, MD;  Location: [facility];  Service: Neurosurgery;  Laterality: N/A;  right side approach   CATARACT EXTRACTION W/PHACO Left 08/10/2020   Procedure: CATARACT EXTRACTION PHACO AND INTRAOCULAR LENS PLACEMENT (IOC) LEFT PANOPTIX TORIC MALYUGIN 6.72 01:15.9 8.8%;  Surgeon: Lockie Mola, MD;  Location: [facility];  Service: Ophthalmology;  Laterality: Left;  Diabetic - oral meds   CATARACT EXTRACTION W/PHACO Right 08/24/2020   Procedure: CATARACT EXTRACTION PHACO AND INTRAOCULAR LENS PLACEMENT (IOC) RIGHT PANOPTIX TORIC MALYUGIN;  Surgeon: Lockie Mola, MD;  Location: [facility];  Service: Ophthalmology;  Laterality: Right;  7.58 1:13.7 8.3%   CERVICAL WOUND DEBRIDEMENT N/A 02/05/2016   Procedure: Exploration of Cervical four - five wound. Removal of  cervical four - five instrumentation. Insertion of new cervical four - five instrumentation.;  Surgeon: Hilda Lias, MD;  Location: [facility];  Service: Neurosurgery;  Laterality: N/A;  Exploration of Cervical four - five wound. Removal of cervical four - five instrumentation. Insertion of new cervical four - five instrumentatio   CORONARY ARTERY BYPASS GRAFT   11/29/2004   EAR BIOPSY     skin ca   LITHOTRIPSY Right    POSTERIOR CERVICAL FUSION/FORAMINOTOMY N/A 02/08/2016   Procedure: CERVICAL FOUR-FIVE  Decompressive Laminectomy and Lateral Mass Fixation;  Surgeon: Donalee Citrin, MD;  Location: [facility];  Service: Neurosurgery;  Laterality: N/A;   TONSILLECTOMY      Social History:  reports that he quit smoking about 40 years ago. His smoking use included cigarettes, pipe, and cigars. He has never used smokeless tobacco. He reports that he does not drink alcohol and does not use drugs.  Family History:  Family History  Problem Relation Age of Onset   Heart disease Mother    Heart disease Father    CAD Brother    Heart block Brother      Prior to Admission medications   Medication Sig Start Date End Date Taking? Authorizing Provider  acetaminophen (TYLENOL) 500 MG tablet Take 1,000 mg by mouth every 6 (six) hours as needed for moderate pain or headache.    [provider]  atorvastatin (LIPITOR) 40 MG tablet Take 1 tablet (40 mg total) by mouth at bedtime. 02/24/16   Angiulli, Mcarthur Rossetti, PA-C  clopidogrel (PLAVIX) 75 MG tablet Take 75 mg by mouth daily.  05/14/16   [provider]  famotidine (PEPCID) 20 MG tablet Take 20 mg by mouth daily as needed for heartburn or indigestion.    [provider]  glimepiride (AMARYL) 4 MG tablet Take 1 tablet by mouth 2 (two) times daily. 08/20/22   [provider]  Glucosamine HCl (GLUCOSAMINE PO) Take 1 tablet by mouth daily.    [provider]  levothyroxine (SYNTHROID) 100 MCG tablet Take 100 mcg by mouth daily before breakfast.    [provider]  lisinopril (ZESTRIL) 2.5 MG tablet Take 2.5 mg by mouth daily.    [provider]  Multiple Vitamin (MULTIVITAMIN WITH MINERALS) TABS tablet Take 1 tablet by mouth every morning.     [provider]  Omega-3 Fatty Acids (FISH OIL PO) Take 1 capsule by mouth daily.    [provider]   sitaGLIPtin (JANUVIA) 50 MG tablet Take 25 mg by mouth daily.    [provider]    Physical Exam: Vitals:   01/15/23 1346 01/15/23 1347 01/15/23 1539 01/15/23 1615  BP: (!) 143/60     Pulse: 89   93  Resp: 18   18  Temp: (!) 100.8 F (38.2 C)  100.1 F (37.8 C)   TempSrc: Oral  Oral   SpO2: 100%   100%  Weight:  75 kg    Height:  5\' 8"  (1.727 m)     General: Not in acute distress HEENT:       Eyes: PERRL, EOMI, no jaundice       ENT: No discharge from the ears and nose, no pharynx injection, no tonsillar enlargement.        Neck: No JVD, no bruit, no mass felt. Heme: No neck lymph node enlargement. Cardiac: S1/S2, RRR, No murmurs, No gallops or rubs. Respiratory: No rales, wheezing, rhonchi or rubs. GI: Soft, nondistended, nontender, no rebound  pain, no organomegaly, BS present. GU: No hematuria Ext: No pitting leg edema bilaterally. 1+DP/PT pulse bilaterally. Musculoskeletal: No joint deformities, No joint redness or warmth, no limitation of ROM in spin. Skin: No rashes.  Neuro: Alert, oriented X3, cranial nerves II-XII grossly intact, moves all extremities. Psych: Patient is not psychotic, no suicidal or hemocidal ideation.  Labs on Admission: I have personally reviewed following labs and imaging studies  CBC: Recent Labs  Lab 01/15/23 1412  WBC 9.7  HGB 12.8*  HCT 38.7*  MCV 92.6  PLT 175   Basic Metabolic Panel: Recent Labs  Lab 01/15/23 1412  NA 130*  K 4.2  CL 98  CO2 22  GLUCOSE 254*  BUN 14  CREATININE 1.10  CALCIUM 9.0   GFR: Estimated Creatinine Clearance: 46.6 mL/min (by C-G formula based on SCr of 1.1 mg/dL). Liver Function Tests: Recent Labs  Lab 01/15/23 1412  AST 29  ALT 24  ALKPHOS 109  BILITOT 0.5  PROT 7.0  ALBUMIN 4.1   No results for input(s): "LIPASE", "AMYLASE" in the last 168 hours. No results for input(s): "AMMONIA" in the last 168 hours. Coagulation Profile: No results for input(s): "INR", "PROTIME" in the  last 168 hours. Cardiac Enzymes: No results for input(s): "CKTOTAL", "CKMB", "CKMBINDEX", "TROPONINI" in the last 168 hours. BNP (last 3 results) No results for input(s): "PROBNP" in the last 8760 hours. HbA1C: No results for input(s): "HGBA1C" in the last 72 hours. CBG: Recent Labs  Lab 01/15/23 1346  GLUCAP 236*   Lipid Profile: No results for input(s): "CHOL", "HDL", "LDLCALC", "TRIG", "CHOLHDL", "LDLDIRECT" in the last 72 hours. Thyroid Function Tests: No results for input(s): "TSH", "T4TOTAL", "FREET4", "T3FREE", "THYROIDAB" in the last 72 hours. Anemia Panel: No results for input(s): "VITAMINB12", "FOLATE", "FERRITIN", "TIBC", "IRON", "RETICCTPCT" in the last 72 hours. Urine analysis:    Component Value Date/Time   COLORURINE AMBER (A) 02/17/2016 1521   APPEARANCEUR CLEAR 02/17/2016 1521   LABSPEC 1.033 (H) 02/17/2016 1521   PHURINE 5.5 02/17/2016 1521   GLUCOSEU 500 (A) 02/17/2016 1521   HGBUR NEGATIVE 02/17/2016 1521   BILIRUBINUR NEGATIVE 02/17/2016 1521   KETONESUR NEGATIVE 02/17/2016 1521   PROTEINUR NEGATIVE 02/17/2016 1521   NITRITE NEGATIVE 02/17/2016 1521   LEUKOCYTESUR NEGATIVE 02/17/2016 1521   Sepsis Labs: @LABRCNTIP (procalcitonin:4,lacticidven:4) ) Recent Results (from the past 240 hour(s))  SARS Coronavirus 2 by RT PCR (hospital order, performed in [facility] hospital lab) *cepheid single result test* Anterior Nasal Swab     Status: Abnormal   Collection Time: 01/15/23  2:12 PM   Specimen: Anterior Nasal Swab  Result Value Ref Range Status   SARS Coronavirus 2 by RT PCR POSITIVE (A) NEGATIVE Final    Comment: (NOTE) SARS-CoV-2 target nucleic acids are DETECTED  SARS-CoV-2 RNA is generally detectable in upper respiratory specimens  during the acute phase of infection.  Positive results are indicative  of the presence of the identified virus, but do not rule out bacterial infection or co-infection with other pathogens not detected by the test.   Clinical correlation with patient history and  other diagnostic information is necessary to determine patient infection status.  The expected result is negative.  Fact Sheet for Patients:   RoadLapTop.co.za   Fact Sheet for Healthcare Providers:   http://kim-miller.com/    This test is not yet approved or cleared by the [place] FDA and  has been authorized for detection and/or diagnosis of SARS-CoV-2 by FDA under an Emergency Use Authorization (EUA).  This EUA will remain in effect (meaning this test can be used) for the duration of  the COVID-19 declaration under Section 564(b)(1)  of the Act, 21 U.S.C. section 360-bbb-3(b)(1), unless the authorization is terminated or revoked sooner.   Performed at [facility], [address], [place], [place] [address]      Radiological Exams on Admission: DG Chest [facility] 1 View  Result Date: 01/15/2023 CLINICAL DATA:  weakness EXAM: PORTABLE CHEST 1 VIEW COMPARISON:  CXR 12/01/04 FINDINGS: No pleural effusion. No pneumothorax. Status post median sternotomy and CABG. There are prominent bilateral interstitial opacities that could represent pulmonary venous congestion or atypical infection. No focal airspace opacity. Visualized upper abdomen is notable for mild colonic gaseous distention. No radiographically apparent displaced rib fractures. IMPRESSION: Prominent bilateral interstitial opacities could represent pulmonary venous congestion or atypical infection. Electronically Signed   By: Lorenza Cambridge M.D.   On: 01/15/2023 14:29      Assessment/Plan Principal Problem:   Pneumonia due to COVID-19 virus Active Problems:   Severe sepsis (HCC)   Essential hypertension   HLD (hyperlipidemia)   Diabetes mellitus without complication (HCC)   CAD (coronary artery disease)   Hypothyroidism   Assessment and Plan:  Severe sepsis due to pneumonia due to COVID-19 virus: Patient does not have  oxygen desaturation, but chest x-ray showed bilateral interstitial opacity, consistent with atypical pneumonia.  Patient meets criteria for severe sepsis with fever 100.8, heart rate 93.  Lactic acid is elevated up to 3.0.  Due to COVID-pneumonia, I will not give aggressive IV fluid resuscitation.  Will give patient a gentle IV fluid.  -Placed on MedSurg bed for observation -As needed albuterol, Mucinex -Nasal cannula oxygen as needed -As needed Tylenol for fever -Solu-Medrol 40 mg twice daily -IV fluid: 500 cc normal saline, Naima 75 cc/h -Trend lactic acid level -Check a procalcitonin level  Essential hypertension -IV hydralazine as needed -Hold lisinopril due to severe sepsis and the risk of developing hypotension  HLD (hyperlipidemia) -Lipitor  Diabetes mellitus without complication (HCC): Recent A1c 6.8, well-controlled.  Patient is taking Amaryl and Januvia at home -SSI  CAD (coronary artery disease): No chest pain -Plavix and Lipitor  Hypothyroidism -Synthroid   DVT ppx:   SQ Lovenox  Code Status: Full code per pt  Family Communication:  Yes, patient's daughter by phone  Disposition Plan:  Anticipate discharge back to previous environment  Consults called:  none  Admission status and Level of care: Med-Surg:   for obs    Dispo: The patient is from: Home              Anticipated d/c is to: Home              Anticipated d/c date is: 1 day              Patient currently is not medically stable to d/c.    Severity of Illness:  The appropriate patient status for this patient is OBSERVATION. Observation status is judged to be reasonable and necessary in order to provide the required intensity of service to ensure the patient's safety. The patient's presenting symptoms, physical exam findings, and initial radiographic and laboratory data in the context of their medical condition is felt to place them at decreased risk for further clinical deterioration. Furthermore,  it is anticipated that the patient will be medically stable for discharge from the hospital within 2 midnights of admission.        Date of Service 01/15/2023  Lorretta Harp [facility]   If 7PM-7AM, please contact night-coverage [email or web address] 01/15/2023, 5:13 PM

## 2023-01-16 DIAGNOSIS — U071 COVID-19: Secondary | ICD-10-CM | POA: Diagnosis not present

## 2023-01-16 DIAGNOSIS — J1282 Pneumonia due to coronavirus disease 2019: Secondary | ICD-10-CM | POA: Diagnosis not present

## 2023-01-16 LAB — C-REACTIVE PROTEIN: CRP: 7.9 mg/dL — ABNORMAL HIGH (ref <1.0)

## 2023-01-16 LAB — CBG MONITORING, ED: Glucose-Capillary: 242 mg/dL — ABNORMAL HIGH (ref 70–99)

## 2023-01-16 MED ORDER — INSULIN ASPART 100 UNIT/ML IJ SOLN
0.0000 [IU] | Freq: Three times a day (TID) | INTRAMUSCULAR | Status: DC
  Administered 2023-01-16: 7 [IU] via SUBCUTANEOUS
  Filled 2023-01-16: qty 1

## 2023-01-16 NOTE — Discharge Summary (Signed)
Physician Discharge Summary   Gary Craig  male DOB: August 26, 1933  WUX:[id number]  PCP: Marisue Ivan, MD  Admit date: 01/15/2023 Discharge date: 01/16/2023  Admitted From: home Disposition:  home CODE STATUS: Full code   Hospital Course:  For full details, please see H&P, progress notes, consult notes and ancillary notes.  Briefly,  FROILAN GREENLAW is a 89 y.o. male with medical history significant of DM, CAD s/p CABG, stroke, who presented with weakness, fever, cough.   Severe sepsis due to  COVID-19 PNA --Patient does not have oxygen desaturation, but chest x-ray showed bilateral interstitial opacity, consistent with atypical pneumonia.  Patient meets criteria for severe sepsis with fever 100.8, heart rate 93.  Lactic acid is elevated up to 3.0.  Procal neg. --pt received IV solumedrol 40 mg x2 --Pt had no respiratory distress, still sating well on room air, and felt ready to go home the next day.   Essential hypertension --resume home Lisinopril after discharge   HLD (hyperlipidemia) -cont home Lipitor   Diabetes mellitus without complication [facility]):  Recent A1c 6.8, well-controlled.   --resume Amaryl and Januvia after discharge.   CAD (coronary artery disease):  No chest pain -cont home Plavix and Lipitor   Hypothyroidism -cont home Synthroid   Discharge Diagnoses:  Principal Problem:   Pneumonia due to COVID-19 virus Active Problems:   Severe sepsis (HCC)   Essential hypertension   HLD (hyperlipidemia)   Diabetes mellitus without complication (HCC)   CAD (coronary artery disease)   Hypothyroidism     Discharge Instructions:  Allergies as of 01/16/2023       Reactions   Icosapent Ethyl    (Vascepa)  Increased blood sugars   Metformin    Other reaction(s): Other (See Comments) GI symptoms        Medication List     TAKE these medications    acetaminophen 500 MG tablet Commonly known as: TYLENOL Take 1,000 mg by mouth every 6  (six) hours as needed for moderate pain or headache.   atorvastatin 40 MG tablet Commonly known as: LIPITOR Take 1 tablet (40 mg total) by mouth at bedtime.   clopidogrel 75 MG tablet Commonly known as: PLAVIX Take 75 mg by mouth daily.   famotidine 20 MG tablet Commonly known as: PEPCID Take 20 mg by mouth daily as needed for heartburn or indigestion.   FISH OIL PO Take 1 capsule by mouth daily.   glimepiride 4 MG tablet Commonly known as: AMARYL Take 1 tablet by mouth 2 (two) times daily.   GLUCOSAMINE PO Take 1 tablet by mouth daily.   levothyroxine 100 MCG tablet Commonly known as: SYNTHROID Take 100 mcg by mouth daily before breakfast. 110 mcg Sat sun    112 mcg m-f   lisinopril 2.5 MG tablet Commonly known as: ZESTRIL Take 2.5 mg by mouth daily.   multivitamin with minerals Tabs tablet Take 1 tablet by mouth every morning.   sitaGLIPtin 50 MG tablet Commonly known as: JANUVIA Take 25 mg by mouth daily.          Allergies  Allergen Reactions   Icosapent Ethyl     (Vascepa)  Increased blood sugars   Metformin     Other reaction(s): Other (See Comments) GI symptoms     The results of significant diagnostics from this hospitalization (including imaging, microbiology, ancillary and laboratory) are listed below for reference.   Consultations:   Procedures/Studies: DG Chest Port 1 View  Result Date: 01/15/2023 CLINICAL  DATA:  weakness EXAM: PORTABLE CHEST 1 VIEW COMPARISON:  CXR 12/01/04 FINDINGS: No pleural effusion. No pneumothorax. Status post median sternotomy and CABG. There are prominent bilateral interstitial opacities that could represent pulmonary venous congestion or atypical infection. No focal airspace opacity. Visualized upper abdomen is notable for mild colonic gaseous distention. No radiographically apparent displaced rib fractures. IMPRESSION: Prominent bilateral interstitial opacities could represent pulmonary venous congestion or atypical  infection. Electronically Signed   By: Lorenza Cambridge M.D.   On: 01/15/2023 14:29      Labs: BNP (last 3 results) No results for input(s): "BNP" in the last 8760 hours. Basic Metabolic Panel: Recent Labs  Lab 01/15/23 1412 01/16/23 0412  NA 130* 134*  K 4.2 4.4  CL 98 103  CO2 22 21*  GLUCOSE 254* 230*  BUN 14 23  CREATININE 1.10 1.11  CALCIUM 9.0 8.9   Liver Function Tests: Recent Labs  Lab 01/15/23 1412  AST 29  ALT 24  ALKPHOS 109  BILITOT 0.5  PROT 7.0  ALBUMIN 4.1   No results for input(s): "LIPASE", "AMYLASE" in the last 168 hours. No results for input(s): "AMMONIA" in the last 168 hours. CBC: Recent Labs  Lab 01/15/23 1412 01/16/23 0412  WBC 9.7 9.6  HGB 12.8* 11.8*  HCT 38.7* 35.4*  MCV 92.6 92.4  PLT 175 164   Cardiac Enzymes: No results for input(s): "CKTOTAL", "CKMB", "CKMBINDEX", "TROPONINI" in the last 168 hours. BNP: Invalid input(s): "POCBNP" CBG: Recent Labs  Lab 01/15/23 1346 01/15/23 1720 01/16/23 0903  GLUCAP 236* 155* 242*   D-Dimer No results for input(s): "DDIMER" in the last 72 hours. Hgb A1c No results for input(s): "HGBA1C" in the last 72 hours. Lipid Profile No results for input(s): "CHOL", "HDL", "LDLCALC", "TRIG", "CHOLHDL", "LDLDIRECT" in the last 72 hours. Thyroid function studies No results for input(s): "TSH", "T4TOTAL", "T3FREE", "THYROIDAB" in the last 72 hours.  Invalid input(s): "FREET3" Anemia work up No results for input(s): "VITAMINB12", "FOLATE", "FERRITIN", "TIBC", "IRON", "RETICCTPCT" in the last 72 hours. Urinalysis    Component Value Date/Time   COLORURINE YELLOW (A) 01/16/2023 0513   APPEARANCEUR HAZY (A) 01/16/2023 0513   LABSPEC 1.019 01/16/2023 0513   PHURINE 5.0 01/16/2023 0513   GLUCOSEU 150 (A) 01/16/2023 0513   HGBUR NEGATIVE 01/16/2023 0513   BILIRUBINUR NEGATIVE 01/16/2023 0513   KETONESUR NEGATIVE 01/16/2023 0513   PROTEINUR NEGATIVE 01/16/2023 0513   NITRITE NEGATIVE 01/16/2023 0513    LEUKOCYTESUR NEGATIVE 01/16/2023 0513   Sepsis Labs Recent Labs  Lab 01/15/23 1412 01/16/23 0412  WBC 9.7 9.6   Microbiology Recent Results (from the past 240 hour(s))  SARS Coronavirus 2 by RT PCR (hospital order, performed in [facility] hospital lab) *cepheid single result test* Anterior Nasal Swab     Status: Abnormal   Collection Time: 01/15/23  2:12 PM   Specimen: Anterior Nasal Swab  Result Value Ref Range Status   SARS Coronavirus 2 by RT PCR POSITIVE (A) NEGATIVE Final    Comment: (NOTE) SARS-CoV-2 target nucleic acids are DETECTED  SARS-CoV-2 RNA is generally detectable in upper respiratory specimens  during the acute phase of infection.  Positive results are indicative  of the presence of the identified virus, but do not rule out bacterial infection or co-infection with other pathogens not detected by the test.  Clinical correlation with patient history and  other diagnostic information is necessary to determine patient infection status.  The expected result is negative.  Fact Sheet for Patients:  RoadLapTop.co.za   Fact Sheet for Healthcare Providers:   http://kim-miller.com/    This test is not yet approved or cleared by the [place] FDA and  has been authorized for detection and/or diagnosis of SARS-CoV-2 by FDA under an Emergency Use Authorization (EUA).  This EUA will remain in effect (meaning this test can be used) for the duration of  the COVID-19 declaration under Section 564(b)(1)  of the Act, 21 U.S.C. section 360-bbb-3(b)(1), unless the authorization is terminated or revoked sooner.   Performed at [facility], [address] [place], [place] [address]   Blood culture (routine x 2)     Status: None (Preliminary result)   Collection Time: 01/15/23  2:12 PM   Specimen: BLOOD RIGHT HAND  Result Value Ref Range Status   Specimen Description BLOOD RIGHT HAND  Final   Special Requests    Final    BOTTLES DRAWN AEROBIC AND ANAEROBIC Blood Culture adequate volume   Culture   Final    NO GROWTH < 24 HOURS Performed at [facility], [address], [place], [place] [address]    Report Status PENDING  Incomplete  Blood culture (routine x 2)     Status: None (Preliminary result)   Collection Time: 01/15/23  2:12 PM   Specimen: BLOOD  Result Value Ref Range Status   Specimen Description BLOOD LEFT ANTECUBITAL  Final   Special Requests   Final    BOTTLES DRAWN AEROBIC AND ANAEROBIC Blood Culture adequate volume   Culture   Final    NO GROWTH < 24 HOURS Performed at [facility], [address] [place], [place] [address]    Report Status PENDING  Incomplete     Total time spend on discharging this patient, including the last patient exam, discussing the hospital stay, instructions for ongoing care as it relates to all pertinent caregivers, as well as preparing the medical discharge records, prescriptions, and/or referrals as applicable, is 35 minutes.    Darlin Priestly, MD  [facility] 01/16/2023, 6:57 PM

## 2023-06-04 ENCOUNTER — Ambulatory Visit (INDEPENDENT_AMBULATORY_CARE_PROVIDER_SITE_OTHER): Payer: Medicare Other | Admitting: Vascular Surgery

## 2023-06-04 ENCOUNTER — Encounter (INDEPENDENT_AMBULATORY_CARE_PROVIDER_SITE_OTHER): Payer: Medicare Other

## 2023-11-05 ENCOUNTER — Encounter (INDEPENDENT_AMBULATORY_CARE_PROVIDER_SITE_OTHER): Payer: Self-pay
